# Patient Record
Sex: Male | Born: 1955
Health system: Southern US, Community
[De-identification: ages and names within clinical notes are randomized; demographics above are authoritative.]

## PROBLEM LIST (undated history)

## (undated) DIAGNOSIS — K420 Umbilical hernia with obstruction, without gangrene: Secondary | ICD-10-CM

## (undated) DIAGNOSIS — F1411 Cocaine abuse, in remission: Secondary | ICD-10-CM

## (undated) DIAGNOSIS — R011 Cardiac murmur, unspecified: Secondary | ICD-10-CM

## (undated) DIAGNOSIS — R0602 Shortness of breath: Secondary | ICD-10-CM

## (undated) DIAGNOSIS — I00 Rheumatic fever without heart involvement: Secondary | ICD-10-CM

## (undated) DIAGNOSIS — I10 Essential (primary) hypertension: Secondary | ICD-10-CM

## (undated) DIAGNOSIS — I509 Heart failure, unspecified: Secondary | ICD-10-CM

## (undated) DIAGNOSIS — K5790 Diverticulosis of intestine, part unspecified, without perforation or abscess without bleeding: Secondary | ICD-10-CM

## (undated) HISTORY — PX: UMBILICAL HERNIA REPAIR: SHX196

## (undated) HISTORY — DX: Diverticulosis of intestine, part unspecified, without perforation or abscess without bleeding: K57.90

## (undated) HISTORY — PX: TONSILLECTOMY: SUR1361

## (undated) HISTORY — DX: Cocaine abuse, in remission: F14.11

## (undated) HISTORY — DX: Rheumatic fever without heart involvement: I00

---

## 2002-01-15 ENCOUNTER — Emergency Department (HOSPITAL_COMMUNITY): Admission: EM | Admit: 2002-01-15 | Discharge: 2002-01-15 | Payer: Self-pay | Admitting: Emergency Medicine

## 2003-12-13 ENCOUNTER — Emergency Department (HOSPITAL_COMMUNITY): Admission: EM | Admit: 2003-12-13 | Discharge: 2003-12-13 | Payer: Self-pay | Admitting: Emergency Medicine

## 2010-11-08 ENCOUNTER — Emergency Department (HOSPITAL_COMMUNITY): Payer: Self-pay

## 2010-11-08 ENCOUNTER — Inpatient Hospital Stay (HOSPITAL_COMMUNITY)
Admission: EM | Admit: 2010-11-08 | Discharge: 2010-11-14 | DRG: 287 | Disposition: A | Discharge status: Home / Self Care | Payer: Self-pay | Attending: Internal Medicine | Admitting: Internal Medicine

## 2010-11-08 DIAGNOSIS — F1010 Alcohol abuse, uncomplicated: Secondary | ICD-10-CM | POA: Diagnosis present

## 2010-11-08 DIAGNOSIS — I509 Heart failure, unspecified: Secondary | ICD-10-CM | POA: Diagnosis present

## 2010-11-08 DIAGNOSIS — I5023 Acute on chronic systolic (congestive) heart failure: Principal | ICD-10-CM | POA: Diagnosis present

## 2010-11-08 DIAGNOSIS — F17200 Nicotine dependence, unspecified, uncomplicated: Secondary | ICD-10-CM | POA: Diagnosis present

## 2010-11-08 DIAGNOSIS — R0602 Shortness of breath: Secondary | ICD-10-CM

## 2010-11-08 DIAGNOSIS — Z9119 Patient's noncompliance with other medical treatment and regimen: Secondary | ICD-10-CM

## 2010-11-08 DIAGNOSIS — I10 Essential (primary) hypertension: Secondary | ICD-10-CM | POA: Diagnosis present

## 2010-11-08 DIAGNOSIS — Z91199 Patient's noncompliance with other medical treatment and regimen due to unspecified reason: Secondary | ICD-10-CM

## 2010-11-08 DIAGNOSIS — Z7982 Long term (current) use of aspirin: Secondary | ICD-10-CM

## 2010-11-08 DIAGNOSIS — I428 Other cardiomyopathies: Secondary | ICD-10-CM | POA: Diagnosis present

## 2010-11-08 HISTORY — DX: Heart failure, unspecified: I50.9

## 2010-11-08 HISTORY — DX: Essential (primary) hypertension: I10

## 2010-11-08 LAB — CARDIAC PANEL(CRET KIN+CKTOT+MB+TROPI)
CK, MB: 7.8 ng/mL (ref 0.3–4.0)
CK, MB: 5.5 ng/mL — ABNORMAL HIGH (ref 0.3–4.0)
Total CK: 150 U/L (ref 7–232)

## 2010-11-08 LAB — DIFFERENTIAL
Basophils Absolute: 0.1 10*3/uL (ref 0.0–0.1)
Basophils Relative: 1 % (ref 0–1)
Eosinophils Relative: 3 % (ref 0–5)
Monocytes Absolute: 0.5 10*3/uL (ref 0.1–1.0)

## 2010-11-08 LAB — D-DIMER, QUANTITATIVE: D-Dimer, Quant: 0.52 ug/mL-FEU — ABNORMAL HIGH (ref 0.00–0.48)

## 2010-11-08 LAB — TSH: TSH: 1.587 u[IU]/mL (ref 0.350–4.500)

## 2010-11-08 LAB — CBC
MCHC: 34.2 g/dL (ref 30.0–36.0)
Platelets: 235 10*3/uL (ref 150–400)
RDW: 14.6 % (ref 11.5–15.5)

## 2010-11-08 LAB — BASIC METABOLIC PANEL
GFR calc Af Amer: >60 mL/min (ref >60)
GFR calc non Af Amer: >60 mL/min (ref >60)
Potassium: 3.8 mEq/L (ref 3.5–5.1)
Sodium: 140 mEq/L (ref 135–145)

## 2010-11-09 ENCOUNTER — Inpatient Hospital Stay (HOSPITAL_COMMUNITY): Payer: Self-pay

## 2010-11-09 ENCOUNTER — Encounter (HOSPITAL_COMMUNITY): Payer: Self-pay | Admitting: Radiology

## 2010-11-09 DIAGNOSIS — M7989 Other specified soft tissue disorders: Secondary | ICD-10-CM

## 2010-11-09 DIAGNOSIS — I501 Left ventricular failure: Secondary | ICD-10-CM

## 2010-11-09 DIAGNOSIS — I509 Heart failure, unspecified: Secondary | ICD-10-CM

## 2010-11-09 LAB — CARDIAC PANEL(CRET KIN+CKTOT+MB+TROPI)
CK, MB: 4.4 ng/mL — ABNORMAL HIGH (ref 0.3–4.0)
Total CK: 105 U/L (ref 7–232)
Troponin I: <0.3 ng/mL (ref <0.30)

## 2010-11-09 LAB — DIFFERENTIAL
Basophils Absolute: 0.1 10*3/uL (ref 0.0–0.1)
Eosinophils Relative: 3 % (ref 0–5)
Lymphocytes Relative: 28 % (ref 12–46)

## 2010-11-09 LAB — BASIC METABOLIC PANEL
BUN: 15 mg/dL (ref 6–23)
Calcium: 9.2 mg/dL (ref 8.4–10.5)
Creatinine, Ser: 1.14 mg/dL (ref 0.50–1.35)
GFR calc Af Amer: >60 mL/min (ref >60)
GFR calc non Af Amer: >60 mL/min (ref >60)
Potassium: 3.8 mEq/L (ref 3.5–5.1)

## 2010-11-09 LAB — CBC
HCT: 41.1 % (ref 39.0–52.0)
Platelets: 212 10*3/uL (ref 150–400)
RDW: 14.5 % (ref 11.5–15.5)
WBC: 6 10*3/uL (ref 4.0–10.5)

## 2010-11-09 LAB — POCT I-STAT, CHEM 8
BUN: 14 mg/dL (ref 6–23)
Creatinine, Ser: 1.2 mg/dL (ref 0.50–1.35)
Glucose, Bld: 95 mg/dL (ref 70–99)
HCT: 45 % (ref 39.0–52.0)
Hemoglobin: 15.3 g/dL (ref 13.0–17.0)

## 2010-11-09 MED ORDER — IOHEXOL 300 MG/ML  SOLN
100.0000 mL | Freq: Once | INTRAMUSCULAR | Status: AC | PRN
  Administered 2010-11-09: 100 mL via INTRAVENOUS

## 2010-11-10 DIAGNOSIS — I5021 Acute systolic (congestive) heart failure: Secondary | ICD-10-CM

## 2010-11-10 LAB — PRO B NATRIURETIC PEPTIDE: Pro B Natriuretic peptide (BNP): 165.4 pg/mL — ABNORMAL HIGH (ref 0–125)

## 2010-11-10 LAB — BASIC METABOLIC PANEL
Chloride: 102 mEq/L (ref 96–112)
Creatinine, Ser: 1.07 mg/dL (ref 0.50–1.35)
GFR calc Af Amer: >60 mL/min (ref >60)
Potassium: 4.3 mEq/L (ref 3.5–5.1)
Sodium: 138 mEq/L (ref 135–145)

## 2010-11-11 LAB — BASIC METABOLIC PANEL
BUN: 13 mg/dL (ref 6–23)
BUN: 13 mg/dL (ref 6–23)
CO2: 26 mEq/L (ref 19–32)
Chloride: 103 mEq/L (ref 96–112)
Chloride: 103 mEq/L (ref 96–112)
Creatinine, Ser: 1.15 mg/dL (ref 0.50–1.35)
Creatinine, Ser: 1.13 mg/dL (ref 0.50–1.35)
GFR calc Af Amer: >60 mL/min (ref >60)
Glucose, Bld: 128 mg/dL — ABNORMAL HIGH (ref 70–99)
Potassium: 4.2 mEq/L (ref 3.5–5.1)
Potassium: 4.7 mEq/L (ref 3.5–5.1)

## 2010-11-11 LAB — CBC
HCT: 45 % (ref 39.0–52.0)
Hemoglobin: 15.2 g/dL (ref 13.0–17.0)
Hemoglobin: 15.9 g/dL (ref 13.0–17.0)
MCHC: 34.5 g/dL (ref 30.0–36.0)
MCHC: 35.3 g/dL (ref 30.0–36.0)
MCV: 95.3 fL (ref 78.0–100.0)
RBC: 4.6 MIL/uL (ref 4.22–5.81)
RDW: 14 % (ref 11.5–15.5)
WBC: 7.9 10*3/uL (ref 4.0–10.5)
WBC: 7 10*3/uL (ref 4.0–10.5)

## 2010-11-11 LAB — POCT I-STAT 3, ART BLOOD GAS (G3+)
Acid-base deficit: 1 mmol/L (ref 0.0–2.0)
Acid-base deficit: 2 mmol/L (ref 0.0–2.0)
Bicarbonate: 22 mEq/L (ref 20.0–24.0)
Bicarbonate: 24 mEq/L (ref 20.0–24.0)
O2 Saturation: 89 %
O2 Saturation: 93 %
TCO2: 25 mmol/L (ref 0–100)
pCO2 arterial: 33.7 mmHg — ABNORMAL LOW (ref 35.0–45.0)
pO2, Arterial: 57 mmHg — ABNORMAL LOW (ref 80.0–100.0)

## 2010-11-11 LAB — POCT I-STAT 3, VENOUS BLOOD GAS (G3P V)
Bicarbonate: 23.3 mEq/L (ref 20.0–24.0)
Bicarbonate: 25.2 mEq/L — ABNORMAL HIGH (ref 20.0–24.0)
O2 Saturation: 55 %
O2 Saturation: 61 %
TCO2: 25 mmol/L (ref 0–100)
TCO2: 26 mmol/L (ref 0–100)
pCO2, Ven: 39 mmHg — ABNORMAL LOW (ref 45.0–50.0)
pCO2, Ven: 40.2 mmHg — ABNORMAL LOW (ref 45.0–50.0)
pCO2, Ven: 41.5 mmHg — ABNORMAL LOW (ref 45.0–50.0)
pH, Ven: 7.364 — ABNORMAL HIGH (ref 7.250–7.300)
pH, Ven: 7.371 — ABNORMAL HIGH (ref 7.250–7.300)
pH, Ven: 7.391 — ABNORMAL HIGH (ref 7.250–7.300)
pO2, Ven: 30 mmHg (ref 30.0–45.0)
pO2, Ven: 32 mmHg (ref 30.0–45.0)

## 2010-11-11 LAB — PROTIME-INR
INR: 1.2 (ref 0.00–1.49)
Prothrombin Time: 15.5 seconds — ABNORMAL HIGH (ref 11.6–15.2)

## 2010-11-11 LAB — APTT: aPTT: 28 seconds (ref 24–37)

## 2010-11-11 LAB — HEPATIC FUNCTION PANEL
ALT: 63 U/L — ABNORMAL HIGH (ref 0–53)
AST: 16 U/L (ref 0–37)
Total Protein: 6.4 g/dL (ref 6.0–8.3)

## 2010-11-11 NOTE — Consult Note (Signed)
NAMEDANH, BAYUS NO.:  [id number]  MEDICAL RECORD NO.:  [id number]  LOCATION:  4738                         FACILITY:  [facility]  PHYSICIAN:  Cassell Clement, M.D. DATE OF BIRTH:  10-17-1955  DATE OF CONSULTATION: DATE OF DISCHARGE:                                CONSULTATION   CARDIOLOGIST:  None.  PRIMARY MD:  None.  CHIEF COMPLAINT:  Shortness of breath.  HISTORY:  This 55 year old gentleman is admitted with a 2 months history of exertional dyspnea.  He states that the dyspnea came on rapidly 2 months ago while he was walking to the store.  He was quite short of breath and had to stop and rest.  He did not seek medical attention at that time.  He initially improved without specific therapy, but then over the last 2 months has gradually worsened again.  He has developed severe orthopnea.  Several days ago, he also noted some swelling of his left leg and foot.  The patient has noted occasional pounding of his heart.  He insists that he has had no chest pain.  He has had no hemoptysis or pleuritic pain.  His past medical history is positive for a history of rheumatic fever at age 50.  He states that he was hospitalized at [facility] at that time for 3 months.  He states he was treated with sulfa drugs for 3 months because of a questionable allergy to PENICILLIN at that time.  He was told as a child that he had a "hole in his heart."  The patient also gives a history of having had high blood pressure in the past, but he was last treated about 9 or 10 years ago for that.  The social history reveals that he is divorced.  He has two children and one adopted child.  He drinks about 12 beers on the weekends.  He does not drink during the week when he is working.  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  He smokes about a fourth of a pack of cigarettes a day.  He has not used any crack cocaine since about 2005.  His  family history reveals that his mother is still alive, but has end- stage renal disease, on dialysis.  The patient's father died when he was a young child of unknown cause.  REVIEW OF SYSTEMS:  The patient has occasional chills and sweats.  He has not had any significant weight change.  He notes occasional nocturia.  He has had no history of peptic ulcer, hematochezia or melena.  He has had a dry cough, but no sputum production.  All other systems negative in detail.  HOME MEDICATIONS:  None.  ALLERGIES:  History of questionable allergy to PENICILLIN.  PHYSICAL EXAMINATION:  VITAL SIGNS:  His blood pressure is 110/80, pulse is 107 and regular. GENERAL APPEARANCE:  Reveals a well-developed and well-nourished gentleman in no distress. NECK:  The jugular venous pressure is elevated.  The carotids reveal no bruits.  There is no lymphadenopathy. HEENT:  Pupils are equal and reactive.  Extraocular movements are full. CHEST:  Clear to percussion and auscultation.  HEART:  Reveals an S3 gallop.  No S4.  I do not hear any opening snap or mitral rumble tonight. ABDOMEN:  Reveals an umbilical hernia about the sizable across bowl.  It is soft and nontender. EXTREMITIES:  Show 1+ left lower leg pitting edema.  There is no calf tenderness or evidence of phlebitis.  His chest x-ray shows moderate cardiomegaly.  The patient has diffuse interstitial thickening of indeterminate acuity, which could be component of mild pulmonary venous hypertension.  His electrocardiogram shows sinus tachycardia.  He has a left atrial enlargement by EKG.  There are no ischemic ST-T wave changes.  Laboratory studies include normal CBC and normal electrolytes, normal renal function.  His CK-MB is elevated at 7.8 and his troponin is less than 0.30.  B-natriuretic peptide is 986.  IMPRESSION: 1. Congestive heart failure, which appears to be acute on chronic by     history.  The type of congestive heart failure  whether systolic or     diastolic remains to be determined, but I suspect he probably has     at least an element of systolic dysfunction.  The echo will be very     helpful in this regard. 2. History of rheumatic heart disease at age 60.  EKG suggest left     atrial enlargement.  Have to keep in mind mitral stenosis, although     I do not hear any opening snap or mitral stenosis murmur tonight. 3. History of moderate alcohol on weekends.  Consider possible     etiologic factor if the echo shows a dilated left ventricle.  PLAN:  Agree with workup as outlined by [facility].  We will check a D-dimer in view of his left leg edema.  Await echo results. Continue Lasix.  We will follow.  Thank you for this interesting consult.          ______________________________ Cassell Clement, M.D.     TB/MEDQ  D:  11/08/2010  T:  11/09/2010  Job:  [id number]  Electronically Signed by Cassell Clement M.D. on 11/11/2010 12:56:53 PM

## 2010-11-12 DIAGNOSIS — I5023 Acute on chronic systolic (congestive) heart failure: Secondary | ICD-10-CM

## 2010-11-12 LAB — CBC
HCT: 45.1 % (ref 39.0–52.0)
Hemoglobin: 15.8 g/dL (ref 13.0–17.0)
MCH: 33.3 pg (ref 26.0–34.0)
MCHC: 35 g/dL (ref 30.0–36.0)
RBC: 4.74 MIL/uL (ref 4.22–5.81)

## 2010-11-12 LAB — BASIC METABOLIC PANEL
BUN: 17 mg/dL (ref 6–23)
CO2: 27 mEq/L (ref 19–32)
Calcium: 9.2 mg/dL (ref 8.4–10.5)
Glucose, Bld: 92 mg/dL (ref 70–99)
Potassium: 4 mEq/L (ref 3.5–5.1)
Sodium: 136 mEq/L (ref 135–145)

## 2010-11-13 LAB — BASIC METABOLIC PANEL WITH GFR
BUN: 17 mg/dL (ref 6–23)
CO2: 27 meq/L (ref 19–32)
Calcium: 9.2 mg/dL (ref 8.4–10.5)
Chloride: 101 meq/L (ref 96–112)
Creatinine, Ser: 1.23 mg/dL (ref 0.50–1.35)
GFR calc Af Amer: >60 mL/min
GFR calc non Af Amer: >60 mL/min
Glucose, Bld: 99 mg/dL (ref 70–99)
Potassium: 4.5 meq/L (ref 3.5–5.1)
Sodium: 137 meq/L (ref 135–145)

## 2010-11-13 LAB — GLUCOSE, CAPILLARY: Glucose-Capillary: 111 mg/dL — ABNORMAL HIGH (ref 70–99)

## 2010-11-14 LAB — BASIC METABOLIC PANEL
CO2: 28 mEq/L (ref 19–32)
Chloride: 99 mEq/L (ref 96–112)
Glucose, Bld: 93 mg/dL (ref 70–99)
Potassium: 4.2 mEq/L (ref 3.5–5.1)
Sodium: 134 mEq/L — ABNORMAL LOW (ref 135–145)

## 2010-11-14 LAB — HEPATITIS PANEL, ACUTE
HCV Ab: NEGATIVE
Hep A IgM: NEGATIVE
Hep B C IgM: NEGATIVE
Hepatitis B Surface Ag: NEGATIVE

## 2010-11-17 ENCOUNTER — Encounter: Payer: Self-pay | Admitting: *Deleted

## 2010-11-17 ENCOUNTER — Encounter (HOSPITAL_COMMUNITY): Payer: Self-pay | Admitting: Internal Medicine

## 2010-11-21 ENCOUNTER — Ambulatory Visit (HOSPITAL_COMMUNITY)
Admission: RE | Admit: 2010-11-21 | Discharge: 2010-11-21 | Disposition: A | Discharge status: Home / Self Care | Payer: Self-pay | Source: Ambulatory Visit | Attending: Internal Medicine | Admitting: Internal Medicine

## 2010-11-21 VITALS — BP 92/60 | HR 84 | Wt 219.5 lb

## 2010-11-21 DIAGNOSIS — F1010 Alcohol abuse, uncomplicated: Secondary | ICD-10-CM | POA: Insufficient documentation

## 2010-11-21 DIAGNOSIS — Z720 Tobacco use: Secondary | ICD-10-CM

## 2010-11-21 DIAGNOSIS — I5022 Chronic systolic (congestive) heart failure: Secondary | ICD-10-CM | POA: Insufficient documentation

## 2010-11-21 DIAGNOSIS — F17200 Nicotine dependence, unspecified, uncomplicated: Secondary | ICD-10-CM | POA: Insufficient documentation

## 2010-11-21 LAB — BASIC METABOLIC PANEL
BUN: 35 mg/dL — ABNORMAL HIGH (ref 6–23)
Calcium: 11.1 mg/dL — ABNORMAL HIGH (ref 8.4–10.5)
Creatinine, Ser: 2.39 mg/dL — ABNORMAL HIGH (ref 0.50–1.35)
GFR calc non Af Amer: 31 mL/min — ABNORMAL LOW (ref >90)
Glucose, Bld: 97 mg/dL (ref 70–99)

## 2010-11-21 NOTE — Progress Notes (Signed)
HPI:  Shawn Perkins is a 55 year old African  American male with a h/o severe HTN, tobacco and ETOH use who was admitted to [facility] in September 2012 due to ADHF.     Workup showed EF 20%. Underwent cath (11/11/10) which showed normal cors. EF 20% RA 7, RV 51/4, PA 61/30, with a mean of 47, pulmonary capillary   wedge pressure mean 30.  Cardiac output was 4.0.  He was diuresed aggressively and lisinopril, spiro, carvedilol and lasix. His discharge weight was 99.8 kg.    He returns for f/u. He says he is doing quite well. 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 No CP, SOB or orthopnea. He has been very compliant with his medications. Weight is stable at 215 pounds. Feels lightheaded at times when standing. No syncope.  Has stopped drinking completely. Cutting back on cigarettes.   ROS: All systems negative except as listed in HPI, PMH and Problem List.  Past Medical History  Diagnosis Date  . Systolic heart failure     11/09/10 echo with EF 35%, 11/11/10 cath with EF 20%  . Hypertension   . Rheumatic fever     as a child  . History of cocaine abuse     use to smoke crack cocaine, last use was more than 6 years ago    Current Outpatient Prescriptions  Medication Sig Dispense Refill  . carvedilol (COREG) 3.125 MG tablet Take 3.125 mg by mouth 2 (two) times daily with a meal.        . furosemide (LASIX) 40 MG tablet Take 40 mg by mouth daily.        Marland Kitchen lisinopril (PRINIVIL,ZESTRIL) 10 MG tablet Take 10 mg by mouth 2 (two) times daily.        Marland Kitchen spironolactone (ALDACTONE) 25 MG tablet Take 25 mg by mouth daily.           PHYSICAL EXAM: Filed Vitals:   11/21/10 1035  BP: 92/60  Pulse: 84   General:  Well appearing. No resp difficulty HEENT: normal Neck: supple. JVP flat. Carotids 2+ bilaterally; no bruits. No lymphadenopathy or thryomegaly appreciated. Cor: PMI normal. Regular rate & rhythm. No rubs, gallops or  murmurs. Lungs: clear Abdomen: soft, nontender, nondistended. No hepatosplenomegaly. No bruits or masses. Good bowel sounds. Extremities: no cyanosis, clubbing, rash, edema Neuro: alert & orientedx3, cranial nerves grossly intact. Moves all 4 extremities w/o difficulty. Affect pleasant.   ASSESSMENT & PLAN:

## 2010-11-21 NOTE — Assessment & Plan Note (Signed)
Doing well. NYHA II. However he is volume depleted and hypotensive. Will hold lasix for 3 days and resume at 20mg  daily on Thursday. Cut lisinopril to 10qd. Liberalize salt today. Check labs. F/u 1 week.

## 2010-11-21 NOTE — Assessment & Plan Note (Signed)
He has cut back dramatically. I have counseled him on the need to stop completely.

## 2010-11-21 NOTE — Assessment & Plan Note (Signed)
Has stopped drinking completely.

## 2010-11-21 NOTE — Patient Instructions (Signed)
Hold Furosemide (Lasix) for 3 days, then resume at 20 mg daily starting on Thursday Decrease Lisinopril to 10 mg daily  Labs today  Your physician recommends that you schedule a follow-up appointment in: 1 week

## 2010-11-28 ENCOUNTER — Encounter (HOSPITAL_COMMUNITY): Payer: Self-pay

## 2010-11-28 ENCOUNTER — Ambulatory Visit (HOSPITAL_COMMUNITY)
Admission: RE | Admit: 2010-11-28 | Discharge: 2010-11-28 | Disposition: A | Discharge status: Home / Self Care | Payer: Self-pay | Source: Ambulatory Visit | Attending: Internal Medicine | Admitting: Internal Medicine

## 2010-11-28 VITALS — BP 117/88 | HR 74 | Wt 223.8 lb

## 2010-11-28 DIAGNOSIS — I5022 Chronic systolic (congestive) heart failure: Secondary | ICD-10-CM

## 2010-11-28 DIAGNOSIS — I10 Essential (primary) hypertension: Secondary | ICD-10-CM | POA: Insufficient documentation

## 2010-11-28 DIAGNOSIS — I5020 Unspecified systolic (congestive) heart failure: Secondary | ICD-10-CM | POA: Insufficient documentation

## 2010-11-28 DIAGNOSIS — Z79899 Other long term (current) drug therapy: Secondary | ICD-10-CM | POA: Insufficient documentation

## 2010-11-28 LAB — BASIC METABOLIC PANEL
BUN: 21 mg/dL (ref 6–23)
Calcium: 10 mg/dL (ref 8.4–10.5)
GFR calc non Af Amer: 69 mL/min — ABNORMAL LOW (ref >90)
Glucose, Bld: 84 mg/dL (ref 70–99)

## 2010-11-28 MED ORDER — CARVEDILOL 3.125 MG PO TABS: 6.2500 mg | ORAL_TABLET | Freq: Two times a day (BID) | ORAL | Status: DC

## 2010-11-28 NOTE — Patient Instructions (Addendum)
Increase Coreg to 6.25 mg bid   Check BMET   Please weigh and record daily   Follow up in 3-4 weeks

## 2010-11-28 NOTE — Progress Notes (Signed)
  HPI:  Shawn Perkins is a 55 year old African  American male with a h/o severe HTN, tobacco and ETOH use who was admitted to [facility] in September 2012 due to ADHF.     Workup showed EF 20%. Underwent cath (11/11/10) which showed normal cors. EF 20% RA 7, RV 51/4, PA 61/30, with a mean of 47, pulmonary capillary   wedge pressure mean 30.  Cardiac output was 4.0.  He was diuresed aggressively and lisinopril, spiro, carvedilol and lasix. His discharge weight was 99.8 kg.    He returns for f/u. He feels better. Just dizzy  when he bends over. Last visit Spironolactone stopped due to elevated potassium and also decreased lisinopril 10 mg daily.  No CP, SOB or orthopnea. He has been very compliant with his medications. Weight is stable at 215 pounds.  Able to sleep on one pillow flat in bed.  Continues to walk 3-4 miles a day. Smokes 1-2  cigarettes.   ROS: All systems negative except as listed in HPI, PMH and Problem List.  Past Medical History  Diagnosis Date  . Systolic heart failure     11/09/10 echo with EF 35%, 11/11/10 cath with EF 20%  . Hypertension   . Rheumatic fever     as a child  . History of cocaine abuse     use to smoke crack cocaine, last use was more than 6 years ago    Current Outpatient Prescriptions  Medication Sig Dispense Refill  . carvedilol (COREG) 3.125 MG tablet Take 3.125 mg by mouth 2 (two) times daily with a meal.        . furosemide (LASIX) 40 MG tablet Take 40 mg by mouth daily.        Marland Kitchen lisinopril (PRINIVIL,ZESTRIL) 10 MG tablet Take 10 mg by mouth daily.          PHYSICAL EXAM: Filed Vitals:   11/28/10 1200  BP: 117/88  Pulse: 74   General:  Well appearing. No resp difficulty HEENT: normal Neck: supple. JVP flat. Carotids 2+ bilaterally; no bruits. No lymphadenopathy or thryomegaly appreciated. Cor: PMI normal. Regular rate & rhythm. No rubs, gallops or murmurs. Lungs: clear Abdomen: soft, nontender, nondistended. No hepatosplenomegaly. No  bruits or masses. Good bowel sounds. Extremities: no cyanosis, clubbing, rash, no edema Neuro: alert & orientedx3, cranial nerves grossly intact. Moves all 4 extremities w/o difficulty. Affect pleasant.   ASSESSMENT & PLAN:

## 2010-11-28 NOTE — Assessment & Plan Note (Addendum)
NYHA II. Volume status stable. He is tolerating medications. Will increase Carvedilol to 6. 25 mg BID. Check BMET today. Discussed exercise limitations and benefits of aerobic exercise. Avoid heavy straining. Follow up in 2 weeks for medication titration.   Patient seen and examined with Tonye Becket, NP. We discussed all aspects of the encounter. I agree with the assessment and plan as stated above.

## 2010-11-30 ENCOUNTER — Telehealth (HOSPITAL_COMMUNITY): Payer: Self-pay | Admitting: *Deleted

## 2010-11-30 MED ORDER — CARVEDILOL 6.25 MG PO TABS: 6.2500 mg | ORAL_TABLET | Freq: Two times a day (BID) | ORAL | Status: DC

## 2010-11-30 NOTE — Telephone Encounter (Signed)
Pt states pharmacy does not have prescription for carvedilol, new rx for carvedilol 6.25 called into pharmacy

## 2010-11-30 NOTE — Telephone Encounter (Signed)
Pt called this am, a prescription needs to be called into [facility] from his appt on Monday.

## 2010-12-01 NOTE — Cardiovascular Report (Signed)
  NAMERHYLAND, HINDERLITER NO.:  [id number]  MEDICAL RECORD NO.:  [id number]  LOCATION:  4738                         FACILITY:  [facility]  PHYSICIAN:  Rollene Rotunda, MD, FACCDATE OF BIRTH:  1955-05-27  DATE OF PROCEDURE:  11/11/2010 DATE OF DISCHARGE:                           CARDIAC CATHETERIZATION   PRIMARY CARE PHYSICIAN:  None.  CARDIOLOGIST:  Cassell Clement, MD  PROCEDURE:  Left and right heart catheterization/coronary arteriography.  INDICATION:  The patient with dilated cardiomyopathy.  PROCEDURE NOTE:  Left heart catheterization was performed via right femoral artery, right heart catheterization performed via right femoral vein.  Both vessels cannulated using an anterior wall puncture.  A # 5- French arterial sheath and a #7-French venous sheath were inserted via the modified Seldinger technique.  Preformed Judkins, pigtail, a Swan- Ganz catheters were utilized.  The patient tolerated the procedure well and left lab in stable condition.  RESULTS:  Hemodynamics RA 7, RV 51/4, PA 61/30 with a mean of 47, pulmonary capillary wedge pressure mean 30, LV 107/25, AO 97/46, cardiac output/cardiac index (Fick) 4.5, 9/2.08, SVR 14/48 coronaries, left main was normal.  The LAD had luminal irregularities.  First and second diagonal were small and normal.  Circumflex in the AV groove was normal. Posterolateral x2 was small and normal.  Ramus intermediate was large and normal.  Right coronary artery was dominant and normal.  PDA was moderate size and normal.  Left ventricular ejection fraction, EF was obtained in the RAO position.  The EF was 20% with global hypokinesis.  CONCLUSION:  Severe dilated cardiomyopathy.  Elevated EDP and wedge.  PLAN:  I reviewed this with Dr. Gala Romney.  We plan to continue medical management.  We will try to push his afterload reduction and I will increase his ACE inhibitor today.  I will resume Lasix tonight understanding that he  did bump his creatinine.  These have been ordered. We need to follow his chemistry closely.  He needs to stop drinking alcohol which may be the etiology of his cardiomyopathy.     Rollene Rotunda, MD, [facility]     JH/MEDQ  D:  11/11/2010  T:  11/11/2010  Job:  [id number]  Electronically Signed by Rollene Rotunda MD [facility] on 12/01/2010 01:38:23 PM

## 2010-12-01 NOTE — H&P (Signed)
NAME:  JAKWAN, SALLY NO.:  [id number]  MEDICAL RECORD NO.:  [id number]  LOCATION:  [facility]                         FACILITY:  [facility]  PHYSICIAN:  Jeoffrey Massed, MD    DATE OF BIRTH:  1955-09-25  DATE OF ADMISSION:  11/08/2010 DATE OF DISCHARGE:                             HISTORY & PHYSICAL   PRIMARY CARE PRACTITIONER:  He does not have one.  HISTORY OF PRESENT ILLNESS:  Mr. Amirault is a very pleasant 55 year old African American male who unfortunately has not seen a primary care practitioner for the past number of years who apparently does carry a diagnosis of hypertension, but has not been on any antihypertensive medications for the past 6 years who does have a past medical history of having rheumatic fever in the 1960s, comes in with a 47-month history of shortness of breath and leg swelling.  He claims that although this has been going on for 2 months, over the past few days this has been worsening.  His shortness of breath is mostly exertional and mostly when he is going uphill or climbing flight of stairs.  He says he can climb up to 1 flight of stairs, but when trying to climb 2 flights of stairs he has to stop, take a break and then go on.  He also claims that he has not been able to lie flat and as a result he has been sleeping upright mostly in a chair.  He also has noted that he has had worsening lower extremity edema.  He also claims that he has been having a cough that is mostly dry for the past a number of weeks.  He was little reluctant to come to the emergency room for further evaluation, but was pushed by his brother and his boss and therefore decided to come here for evaluation. He denies any other complaints like chest pain, nausea, vomiting, diarrhea, headache, or fever.  ALLERGIES:  The patient is allergic to PENICILLIN.  PAST MEDICAL HISTORY: 1. Hypertension, noncompliant to medications. 2. History of rheumatic fever when he was a  child. 3. History of crack cocaine use, last use more than 6 years ago.  He     used to smoke it.  Never did IVDA.  PAST SURGICAL HISTORY:  None.  MEDICATIONS AT HOME:  None.  FAMILY HISTORY:  Denies any family history of heart disease.  SOCIAL HISTORY:  The patient is a smoker.  A few months ago, he used to smoke much more around a pack a day, but lately because of shortness of breath, he has been limited to only 4-5 cigarettes a day.  He drinks beer mostly in the weekends.  He used to smoke crack cocaine, but that was 6 years ago and has stopped since.  REVIEW OF SYSTEMS:  A detailed review of 12 systems was done and these are negative except for the ones noted in the HPI.  PHYSICAL EXAMINATION:  GENERAL:  Lying in bed, slightly dyspneic, but not in acute distress, seems comfortable, speaking in full sentences, not using any accessory muscles of respiration, awake and alert with clear speech. VITAL SIGNS:  Temperature of 98.8, heart rate  of 105, blood pressure of 151/105, and pulse ox of 94% on 2 liters. HEENT:  Atraumatic and normocephalic.  Pupils are equally reactive to light and accommodation. NECK:  Supple.  There does appear to be some mild prominent veins in his neck. CHEST:  There is good air entry with bibasilar rales. CARDIOVASCULAR:  Heart sounds are tachycardic, but regular.  There does appear to be a diastolic murmur best heard in the apex which does seem to radiate into the axilla as well. ABDOMEN:  Soft, nontender, and nondistended.  He does have an upper umbilical hernia in place. EXTREMITIES:  There is 2+ pitting edema in his bilateral lower extremities. NEUROLOGIC:  The patient is awake and alert with no focal neurological deficits.  LABORATORY DATA: 1. CBC shows a WBC of 7.7, hemoglobin of 13.9, hematocrit of 40.7, and     a platelet count of 235. 2. Pro-BNP is 986. 3. Chemistry shows sodium of 140, potassium of 3.8, chloride of 108,     bicarb of 21,  glucose of 86, BUN of 14, creatinine of 1.05, and a     calcium of 9.0.  RADIOLOGICAL STUDIES: 1. X-ray of the chest showed cardiomegaly with diffuse interstitial     thickening.  This is of indeterminate acuity.  Although, this could     represent chronic bronchitis, the sequela of smoking, component of     mild pulmonary venous congestion is suggested.  ASSESSMENT: 1. Likely acute congestive heart failure decompensation, not known at     this time whether this is systolic versus diastolic. 2. Rule out mitral stenosis.  PLAN: 1. The patient will be admitted to telemetry unit. 2. For now, we will diurese him with Lasix 40 mg IV b.i.d. and place     him on lisinopril 10 mg p.o. daily. 3. We will order 2-D echocardiogram. 4. We will also consult Cardiology for further assistance. 5. Cardiac enzymes will be cycled. 6. DVT prophylaxis with Lovenox. 7. Further plan will depend as the patient's clinical course,     evaluation, and further studies obtained. 8. Code status full code.  Total time spent for admission 45 minutes.     Jeoffrey Massed, MD     SG/MEDQ  D:  11/08/2010  T:  11/08/2010  Job:  [id number]  Electronically Signed by Jeoffrey Massed  on 12/01/2010 04:19:05 PM

## 2010-12-04 NOTE — Consult Note (Signed)
Shawn Perkins, Shawn Perkins NO.:  [id number]  MEDICAL RECORD NO.:  [id number]  LOCATION:  4738                         FACILITY:  [facility]  PHYSICIAN:  Shawn Buckles. Yuliana Perkins, MDDATE OF BIRTH:  Apr 17, 1955  DATE OF CONSULTATION:  11/10/2010 DATE OF DISCHARGE:                                CONSULTATION   PRIMARY CARE PHYSICIAN:  None.  REQUESTING PHYSICIAN:  Shawn Clement, MD.  REASON FOR CONSULTATION:  Help with management of heart failure.  Shawn Perkins is a very pleasant 55 year old male with a history of rheumatic fever.  He denies any other significant medical problems.  He has not followed up with doctor in many, many years.  He tells me that when he was young, he spent 3 months at [facility] for rheumatic fever.  He was treated with sulfa drug, as there was some question of an allergy to penicillin.  He recovered from that and has not had any heart problems since that time.  He does have a history of marijuana and cocaine abuse, but he quit in 2004 or 2005.  He does continue to drink alcohol about 12 beers on the weekend.  He denies any other drinking.  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  He says he walks almost 3 miles back-and- forth to his work.  Over the past 2 months, he has noticed increasing exertional dyspnea, and the past day or 2 prior to admission, had orthopnea and PND.  He also noticed some swelling of his left lower extremity.  He did not have any chest pain.  He finally came to the hospital for evaluation.  He was admitted to [facility].  He was tachycardic with an apical murmur.  Echocardiogram was done revealed a markedly dilated LV with biventricular dysfunction, ejection fraction is approximately 25%. He was started on Lasix and diuresed about 5 pounds and now feels much better.  He is also started on ACE inhibitor.  This morning, his blood pressure dropped into the low 90s, so his  diuretics were cut back.  He notices that he has been somewhat tachycardic, but denies any syncope or presyncope.  He has not had any hemoptysis or bleeding or other focal neurologic signs.  REVIEW OF SYSTEMS:  All systems are negative except for HPI and problem list.  PAST MEDICAL HISTORY: 1. Rheumatic fever as a child. 2. History of polysubstance abuse. 3. History of tobacco use, ongoing. 4. Alcohol abuse on weekends.  ADMISSION MEDICATIONS:  None.  CURRENT IN-HOSPITAL MEDICATIONS: 1. Enoxaparin 40 mg a day. 2. Aspirin 81 a day. 3. Lasix 40 IV b.i.d. 4. Lisinopril 10 daily which is recently decreased to 5 daily. 5. Potassium 20 b.i.d.  ALLERGIES:  Possible PENICILLIN.  SOCIAL HISTORY:  He is divorced.  He has children.  He works as a Education officer, environmental person in several office buildings.  Used to smoke a pack to pack and a half of cigarettes a day and now about a quarter pack a day. He drinks about 12 beers on the weekends.  FAMILY HISTORY:  Father died at a young age of unknown cause.  Mother is  alive and has end-stage renal disease.  PHYSICAL EXAMINATION:  GENERAL:  He is lying flat in bed, in no acute distress. RESPIRATIONS:  Unlabored. VITAL SIGNS:  Blood pressure is 117/82 with a heart rate of 94.  His respirations are 18.  He is satting 95% on room air.  Admission weight was 103 kg, now is 101 kg. HEENT:  Normal. NECK:  Supple.  JVP is about 7-8 cm of water.  Carotids are 1+ bilaterally without bruits.  There is no lymphadenopathy or thyromegaly. CARDIAC:  PMI is laterally displaced.  He is regular with an S3.  There is a 2/6 holosystolic murmur at the apex. LUNGS:  Clear. ABDOMEN:  Obese, nontender, nondistended.  No hepatosplenomegaly, bruits, or masses appreciated. EXTREMITIES:  Warm with no significant cyanosis, clubbing, or edema. NEURO:  Alert and oriented x3.  Cranial nerves II-XII are intact.  Moves all fours without difficulty.  Affect is pleasant.  EKG  shows sinus rhythm at a rate of 95.  There is LVH with nonspecific T- wave abnormalities.  There is also evidence of biatrial enlargement. Chest x-ray on admission shows cardiomegaly with pulmonary edema.  He had a CT angiography of the chest which showed mild emphysema.  There was no pulmonary embolus.  There was scattered plaquing of aortic plaquing.  No aneurysm.  Labs show BNP of 165 which is down from 900 on admission.  Sodium is 138, potassium 4.3, BUN 17, creatinine 1.07.  Troponins have been negative.  TSH is normal.  White count is 6.0, hemoglobin 14.1, platelet 212.  Magnesium is 2.1.  He has had lower extremity Dopplers which were negative.  ASSESSMENT: 1. Acute systolic heart failure with ejection fraction approximately     25% and biventricular dysfunction. 2. Ongoing tobacco use. 3. Alcohol abuse. 4. History of rheumatic heart disease.  PLAN/DISCUSSION:  Shawn Perkins volume status is currently improved.  His echo shows a markedly dilated LV with biventricular dysfunction and moderate to severe mitral regurgitation.  I suspect this is a nonischemic cardiomyopathy possibly due to alcohol abuse or untreated hypertension, though his blood pressure is well controlled now.  He is scheduled for right and left heart cath tomorrow.  We will await the results of this.  We will cut back his Lasix given his hypotension and start him on spironolactone and low-dose beta-blocker.  I counseled him the need for complete cessation from alcohol.  He will need close follow-up in the Heart Failure Clinic for medical titration.  We will follow.  We appreciate the consult.  Please do not hesitate to call with questions.     Shawn Buckles. Shawn Couzens, MD     DRB/MEDQ  D:  11/10/2010  T:  11/10/2010  Job:  [id number]  Electronically Signed by Shawn Meres MD on 12/04/2010 02:41:17 PM

## 2010-12-04 NOTE — Discharge Summary (Signed)
Shawn Perkins, Shawn Perkins NO.:  [id number]  MEDICAL RECORD NO.:  [id number]  LOCATION:  4737                         FACILITY:  [facility]  PHYSICIAN:  Bevelyn Buckles. Gao Mitnick, MDDATE OF BIRTH:  [birth year]  DATE OF ADMISSION:  11/08/2010 DATE OF DISCHARGE:  11/14/2010                              DISCHARGE SUMMARY   DISCHARGE DIAGNOSES: 1. Acute systolic heart failure. 2. Hypertension.  ALLERGIES:  No known allergies.  HISTORY OF PRESENT ILLNESS:  Shawn Perkins is a 55 year old African American male that was admitted to [facility] due to shortness of breath.  He has not been seen by primary care physician for a number of years; however, he does state that he has a history of hypertension and has not been taking any medications for this in sometime.  He does have a past medical history of rheumatic fever in the 60s with a significant stay in [facility].  He has a past medical history of exertional dyspnea when walking uphills or stairs.  This became progressively worse over the last 2 months and he developed orthopnea and right and left lower extremity edema.  He works as a Production designer, theatre/television/film man and walks several miles a day to work, but had progressive problems with this due to shortness of breath and he therefore sought medical attention in the [facility] Emergency Department.  Shawn Perkins was initially admitted by the [facility] for diagnosis of acute heart failure.  After initial workup, the heart failure team was consulted for management of this new diagnosis.  While hospitalized, he was noted to have an EF of 20%.  His EKG did reveal left ventricular hypertrophy with nonspecific T-wave abnormalities.  On admission, his pro-BNP was 986 and his admitting weight 103 kg.  He did receive a heart catheterization November 11, 2010, which revealed EF of 20%, RA 7, RV 51/4, PA 61/30, with a mean of 47, pulmonary capillary wedge pressure mean 30.  Cardiac  output was 4.0, SVR was 14/48.  After the results of the cardiac cath, he was initiated on afterload dosage with ACE inhibitors, Lasix for diuresis, and spironolactone.  He has done quite well when hospitalized.  His medications has been titrated for discharge.  His discharge weight is 99.8 kg.  He denies shortness of breath and chest pain.  He will be followed closely in the Heart Failure Clinic for medication titration.  The social workers will provide him a scale for daily weight and our pharmacist will also provide him medications for discharge as he is indigent.  PROCEDURE:  Left and right heart catheterization and coronary arteriography.  HEMODYNAMICS:  RA 7, RV 51/4, mean 61/30, pulmonary capillary wedge pressure is 30, LV is 107/25, cardiac output 4.5, SVR is 14/48, and left main is normal.  EF 20%.  LABORATORY DATA:  November 02, 2010, sodium 134, potassium 4.2, chloride is 99, glucose is 93, BUN is 21, creatinine is 1.33.  November 10, 2010, pro-BNP is 165, which is decreased from the admitting pro-BNP, which was 986.4.  RADIOLOGY:  November 09, 2010, CT chest, no pulmonary embolism identified.  Chest x-ray November 09, 2010, cardiomegaly and interstitial edema.  DISCHARGE MEDICATIONS: 1. Carvedilol 3.125 mg by mouth twice daily. 2. Lasix 40 mg by mouth daily. 3. Lisinopril 10 mg by mouth twice daily. 4. Spironolactone 25 mg by mouth daily.  DISCHARGE INSTRUCTIONS: 1. Diet:  Low sodium 1500 mg diet, no alcohol, and 1500 mL fluid     restriction. 2. Activity:  Increase activity slowly.  FOLLOWUP APPOINTMENTS: 1. [facility], Dr. Allena Katz, [phone] on December 13, 2010, at 10 a.m. 2. Dr. Gala Romney, Heart Failure Clinic [phone] on November 21, 2010, at     10 a.m.  SPECIAL INSTRUCTIONS:  He is instructed to weigh and record weights daily.  If his weight is increased 3 pounds, he is to contact the Heart Failure Clinic at [phone] or if he has shortness of  breath.  Pharmacy will assist with discharge medications.  At the time of discharge, Shawn Perkins has met maximum benefit of his acute care stay.  He was seen and evaluated by Dr. Gala Romney and deemed appropriate for discharge.  He will be discharged home today.     Tonye Becket, NP   ______________________________ Bevelyn Buckles. Shawn Neuberger, MD    AC/MEDQ  D:  11/14/2010  T:  11/14/2010  Job:  [id number]  Electronically Signed by Arvilla Meres MD on 12/04/2010 02:41:04 PM

## 2010-12-18 NOTE — Progress Notes (Signed)
Patient seen and examined with Amy Clegg, NP. We discussed all aspects of the encounter. I agree with the assessment and plan as stated above.   

## 2010-12-19 ENCOUNTER — Encounter (HOSPITAL_COMMUNITY): Payer: Self-pay

## 2010-12-19 ENCOUNTER — Ambulatory Visit (HOSPITAL_COMMUNITY)
Admission: RE | Admit: 2010-12-19 | Discharge: 2010-12-19 | Disposition: A | Discharge status: Home / Self Care | Payer: Self-pay | Source: Ambulatory Visit | Attending: Internal Medicine | Admitting: Internal Medicine

## 2010-12-19 DIAGNOSIS — I5020 Unspecified systolic (congestive) heart failure: Secondary | ICD-10-CM | POA: Insufficient documentation

## 2010-12-19 DIAGNOSIS — Z79899 Other long term (current) drug therapy: Secondary | ICD-10-CM | POA: Insufficient documentation

## 2010-12-19 DIAGNOSIS — I5022 Chronic systolic (congestive) heart failure: Secondary | ICD-10-CM

## 2010-12-19 DIAGNOSIS — F17200 Nicotine dependence, unspecified, uncomplicated: Secondary | ICD-10-CM | POA: Insufficient documentation

## 2010-12-19 DIAGNOSIS — I10 Essential (primary) hypertension: Secondary | ICD-10-CM | POA: Insufficient documentation

## 2010-12-19 DIAGNOSIS — Z720 Tobacco use: Secondary | ICD-10-CM | POA: Insufficient documentation

## 2010-12-19 MED ORDER — FUROSEMIDE 40 MG PO TABS: ORAL_TABLET | ORAL | Status: DC

## 2010-12-19 MED ORDER — CARVEDILOL 6.25 MG PO TABS: ORAL_TABLET | ORAL | Status: DC

## 2010-12-19 NOTE — Progress Notes (Signed)
Patient seen and examined with Amy Clegg, NP. We discussed all aspects of the encounter. I agree with the assessment and plan as stated above.   

## 2010-12-19 NOTE — Assessment & Plan Note (Addendum)
He continues to smoke 2 cigarettes. He has decreased his usage and plans to quit.

## 2010-12-19 NOTE — Progress Notes (Signed)
   HPI:  Shawn Perkins is a 55 year old African  American male with a h/o severe HTN, tobacco and ETOH use who was admitted to [facility] in September 2012 due to ADHF.     Workup showed EF 20%. Underwent cath (11/11/10) which showed normal cors. EF 20% RA 7, RV 51/4, PA 61/30, with a mean of 47, pulmonary capillary   wedge pressure mean 30.  Cardiac output was 4.0.  He was diuresed aggressively and lisinopril, spiro, carvedilol and lasix. His discharge weight was 99.8 kg.   Spironolactone stopped in early October due to a potassium 6.2   He returns for f/u. He feels better. Just dizzy  when he bends over. Last visit Carvedilol increased last visit and he has tolerated the increase. No CP, SOB or orthopnea. He has been very compliant with his medications. Weight is stable at 216-217 pounds.  Able to sleep on one pillow flat in bed.  Continues to walk 3-4 miles a day. Smokes 1-2  cigarettes. He is now being followed at [facility] and he is able to obtain his medications.   ROS: All systems negative except as listed in HPI, PMH and Problem List.  Past Medical History  Diagnosis Date  . Systolic heart failure     11/09/10 echo with EF 35%, 11/11/10 cath with EF 20%  . Hypertension   . Rheumatic fever     as a child  . History of cocaine abuse     use to smoke crack cocaine, last use was more than 6 years ago    Current Outpatient Prescriptions  Medication Sig Dispense Refill  . carvedilol (COREG) 6.25 MG tablet Take 1 tablet (6.25 mg total) by mouth 2 (two) times daily with a meal.  60 tablet  6  . furosemide (LASIX) 40 MG tablet Take 40 mg by mouth daily.        Marland Kitchen lisinopril (PRINIVIL,ZESTRIL) 10 MG tablet Take 10 mg by mouth daily.          PHYSICAL EXAM: Filed Vitals:   12/19/10 1231  BP: 127/83  Pulse: 94   General:  Well appearing. No resp difficulty HEENT: normal Neck: supple. JVP flat. Carotids 2+ bilaterally; no bruits. No lymphadenopathy or thryomegaly  appreciated. Cor: PMI normal. Regular rate & rhythm. No rubs, gallops or murmurs. Lungs: clear Abdomen: soft, nontender, nondistended. No hepatosplenomegaly. No bruits or masses. Good bowel sounds. Extremities: no cyanosis, clubbing, rash, no edema Neuro: alert & orientedx3, cranial nerves grossly intact. Moves all 4 extremities w/o difficulty. Affect pleasant.   ASSESSMENT & PLAN:

## 2010-12-19 NOTE — Assessment & Plan Note (Addendum)
NYHA II. Volume status stable. Tolerating medications but does have occasional dizziness. Will decrease Lasix to 20 mg daily. If weight increases 3 pounds in one day he will take an extra 20 mg of Lasix.  Increase Carvedilol 9.375 mg bid. He will obtain all medications from [facility].  Follow up 3-4 weeks.   Patient seen and examined with Tonye Becket, NP. We discussed all aspects of the encounter. I agree with the assessment and plan as stated above.

## 2010-12-19 NOTE — Patient Instructions (Addendum)
Please take Lasix 20 mg daily  If your weight increase 3 pounds in one day an extra 20mg  Lasix  Please take Carvedilol 9.37 mg in am and pm. (1 1/2 pills in am and pm)  Follow up 3-4 weeks

## 2011-01-09 ENCOUNTER — Telehealth (HOSPITAL_COMMUNITY): Payer: Self-pay | Admitting: *Deleted

## 2011-01-09 MED ORDER — FUROSEMIDE 40 MG PO TABS: 20.0000 mg | ORAL_TABLET | Freq: Every day | ORAL | Status: DC

## 2011-01-09 MED ORDER — CARVEDILOL 6.25 MG PO TABS: 9.3750 mg | ORAL_TABLET | Freq: Two times a day (BID) | ORAL | Status: DC

## 2011-01-09 NOTE — Telephone Encounter (Signed)
Refills sent in, pt is aware

## 2011-01-09 NOTE — Telephone Encounter (Signed)
Shawn Perkins called this am, he is in need for refills for his medication, Carvedilol, and Furosemide.  Please call him to let them know you have ordered them.

## 2011-01-16 ENCOUNTER — Encounter (HOSPITAL_COMMUNITY): Payer: Self-pay

## 2011-01-16 ENCOUNTER — Ambulatory Visit (HOSPITAL_COMMUNITY)
Admission: RE | Admit: 2011-01-16 | Discharge: 2011-01-16 | Disposition: A | Discharge status: Home / Self Care | Payer: Self-pay | Source: Ambulatory Visit | Attending: Internal Medicine | Admitting: Internal Medicine

## 2011-01-16 VITALS — BP 100/68 | HR 87 | Wt 221.0 lb

## 2011-01-16 DIAGNOSIS — Z720 Tobacco use: Secondary | ICD-10-CM

## 2011-01-16 DIAGNOSIS — I5022 Chronic systolic (congestive) heart failure: Secondary | ICD-10-CM

## 2011-01-16 DIAGNOSIS — F17200 Nicotine dependence, unspecified, uncomplicated: Secondary | ICD-10-CM | POA: Insufficient documentation

## 2011-01-16 MED ORDER — CARVEDILOL 6.25 MG PO TABS: 12.5000 mg | ORAL_TABLET | Freq: Two times a day (BID) | ORAL | Status: DC

## 2011-01-16 NOTE — Patient Instructions (Addendum)
ECHO Monday December 10  Take Carvedilol 12.5 mg  Bid  Continue to weigh daily  Follow up in  4 weeks.

## 2011-01-16 NOTE — Progress Notes (Signed)
Patient ID: Shawn Perkins, male   DOB: Jan 23, 1956, 55 y.o.   MRN: [id number]   HPI:  Shawn Perkins is a 55 year old African  American male with a h/o severe HTN, tobacco and ETOH use who was admitted to [facility] in September 2012 due to ADHF.     Workup showed EF 20%. Underwent cath (11/11/10) which showed normal cors. EF 20% RA 7, RV 51/4, PA 61/30, with a mean of 47, pulmonary capillary   wedge pressure mean 30.  Cardiac output was 4.0.  He was diuresed aggressively and lisinopril, spiro, carvedilol and lasix. His discharge weight was 99.8 kg.   Spironolactone stopped in early October due to a potassium 6.2   He returns for f/u. He feels better.  Last visit Carvedilol increased last visit and he has tolerated the increase. No CP/ SOB/orthopnea. He has been very compliant with his medications. Weight is stable at 218 pounds.  He is increased Lasix over Thanksgiving. Able to sleep on one pillow flat in bed.  Continues to walk 3-4 miles a day. Smokes 2  Cigarettes. Tried to stop but his appetite increased.  He did have 12  beers over Thanksgivning. He is followed at [facility] and will receive medications with orange card.   ROS: All systems negative except as listed in HPI, PMH and Problem List.  Past Medical History  Diagnosis Date  . Systolic heart failure     11/09/10 echo with EF 35%, 11/11/10 cath with EF 20%  . Hypertension   . Rheumatic fever     as a child  . History of cocaine abuse     use to smoke crack cocaine, last use was more than 6 years ago    Current Outpatient Prescriptions  Medication Sig Dispense Refill  . carvedilol (COREG) 6.25 MG tablet Take 1.5 tablets (9.375 mg total) by mouth 2 (two) times daily with a meal.  60 tablet  6  . furosemide (LASIX) 40 MG tablet Take 0.5 tablets (20 mg total) by mouth daily.  30 tablet  6  . lisinopril (PRINIVIL,ZESTRIL) 10 MG tablet Take 10 mg by mouth daily.          PHYSICAL EXAM: Filed Vitals:   01/16/11 1126  BP:  100/68  Pulse: 87   General:  Well appearing. No resp difficulty HEENT: normal Neck: supple. JVP 5-6 Carotids 2+ bilaterally; no bruits. No lymphadenopathy or thryomegaly appreciated. Cor: PMI normal. Regular rate & rhythm. No rubs, gallops or murmurs. Lungs: clear Abdomen: soft, nontender, nondistended. No hepatosplenomegaly. No bruits or masses. Good bowel sounds. Extremities: no cyanosis, clubbing, rash, no edema Neuro: alert & orientedx3, cranial nerves grossly intact. Moves all 4 extremities w/o difficulty. Affect pleasant.   ASSESSMENT & PLAN:

## 2011-01-16 NOTE — Assessment & Plan Note (Signed)
Discussed smoking cessation however he declines.  

## 2011-01-16 NOTE — Assessment & Plan Note (Addendum)
NYHA II. Volume status stable. Will increase Carvedilol 12.5 mg bid.Will repeat ECHO next month.  Follow up in 4 weeks.   Patient seen and examined with Tonye Becket, NP. We discussed all aspects of the encounter. I agree with the assessment and plan as stated above. Use of sliding scale diuretics reinforced. Continue to titrate meds as BP tolerates.

## 2011-01-20 NOTE — Progress Notes (Signed)
Patient seen and examined with Amy Clegg, NP. We discussed all aspects of the encounter. I agree with the assessment and plan as stated above.   

## 2011-01-26 ENCOUNTER — Telehealth (HOSPITAL_COMMUNITY): Payer: Self-pay | Admitting: *Deleted

## 2011-01-26 MED ORDER — FUROSEMIDE 40 MG PO TABS: 20.0000 mg | ORAL_TABLET | Freq: Every day | ORAL | Status: DC

## 2011-01-26 MED ORDER — CARVEDILOL 12.5 MG PO TABS: 12.5000 mg | ORAL_TABLET | Freq: Two times a day (BID) | ORAL | Status: DC

## 2011-01-26 NOTE — Telephone Encounter (Signed)
Shawn Perkins called this afternoon requesting refills on his medicaitons.  He needs carvedilol and furosemide to be refilled.  He said that he is using [facility], but doesn't have the phone number. Thanks!

## 2011-01-26 NOTE — Telephone Encounter (Signed)
Spoke w/pt he would like refills sent to [facility] pharmacy, he is aware they have been sent in

## 2011-02-06 ENCOUNTER — Ambulatory Visit (HOSPITAL_COMMUNITY)
Admission: RE | Admit: 2011-02-06 | Discharge: 2011-02-06 | Disposition: A | Discharge status: Home / Self Care | Payer: Self-pay | Source: Ambulatory Visit | Attending: Internal Medicine | Admitting: Internal Medicine

## 2011-02-06 ENCOUNTER — Encounter (HOSPITAL_COMMUNITY): Payer: Self-pay

## 2011-02-06 VITALS — BP 110/70 | HR 74 | Wt 225.0 lb

## 2011-02-06 DIAGNOSIS — Z720 Tobacco use: Secondary | ICD-10-CM

## 2011-02-06 DIAGNOSIS — F17200 Nicotine dependence, unspecified, uncomplicated: Secondary | ICD-10-CM | POA: Insufficient documentation

## 2011-02-06 DIAGNOSIS — I059 Rheumatic mitral valve disease, unspecified: Secondary | ICD-10-CM

## 2011-02-06 DIAGNOSIS — I5022 Chronic systolic (congestive) heart failure: Secondary | ICD-10-CM

## 2011-02-06 MED ORDER — LOSARTAN POTASSIUM 50 MG PO TABS: 50.0000 mg | ORAL_TABLET | Freq: Every day | ORAL | Status: DC

## 2011-02-06 NOTE — Progress Notes (Signed)
  Echocardiogram 2D Echocardiogram has been performed.  Shawn Perkins 02/06/2011, 9:44 AM

## 2011-02-06 NOTE — Patient Instructions (Addendum)
Do the following things EVERYDAY: 1) Weigh yourself in the morning before breakfast. Write it down and keep it in a log. 2) Take your medicines as prescribed 3) Eat low salt foods-Limit salt (sodium) to 2000mg  per day.  4) Stay as active as you can everyday    Follow up in 2  months   STOP Lisinopril  Start Losartan 50 mg daily (If you get dizzy take a half a tablet)

## 2011-02-06 NOTE — Progress Notes (Signed)
Encounter addended by: Noralee Space, RN on: 02/06/2011  3:38 PM<BR>     Documentation filed: Orders

## 2011-02-06 NOTE — Assessment & Plan Note (Addendum)
Continue to encourage complete smoking cessation. Congratulated him on reduced cigarette use. Suggested using electronic cigarette.

## 2011-02-06 NOTE — Assessment & Plan Note (Addendum)
NYHA I.  Volume status stable. ECHO improved EF 55-60%. Continue Lasix 40 mg daily. Will re-check BMET in 2 weeks. May be able to decrease lasix as his EF is normal.   Follow up in 2 months.   Patient seen and examined with Tonye Becket, NP. We discussed all aspects of the encounter. I agree with the assessment and plan as stated above. We reviewed echo together which showed normal LV function. +LVH. Mild AI. He is having ACE-I cough will switch lisinopril to Losartan 50. We checked with [facility] and it is $25 for 3 months so should be able to afford. May need to cut lasix down. Check BMET in 1-2 weeks. Reinforced need to abstain from ETOH.

## 2011-02-06 NOTE — Progress Notes (Signed)
Patient ID: Shawn Perkins, male   DOB: 1956/02/13, 55 y.o.   MRN: [id number] Patient ID: Shawn Perkins, male   DOB: 19-Jul-1955, 55 y.o.   MRN: [id number]   HPI:  [name] is a 55 year old African  American male with a h/o severe HTN, tobacco and ETOH use who was admitted to [facility] in September 2012 due to ADHF.     Workup showed EF 20%. Underwent cath (11/11/10) which showed normal cors. EF 20% RA 7, RV 51/4, PA 61/30, with a mean of 47, pulmonary capillary   wedge pressure mean 30.  Cardiac output was 4.0.  He was diuresed aggressively and lisinopril, spiro, carvedilol and lasix. His discharge weight was 99.8 kg.   Spironolactone stopped in early October due to a potassium 6.2  02/06/2011 ECHO 55-60% He returns for f/u. He feels better.  Complains of dry cough at night. Last visit Carvedilol increased 12.5 mg bid and he has tolerated the increase. No CP/ SOB/orthopnea. He has been very compliant with his medications. Weight is increased 228. He is taking 40 mg Lasix daily now due to weight gain.  Able to sleep on one pillow flat in bed.  Continues to walk 3-4 miles a day. Decreasing cigarette use. He is down 2 per day. Appetite increased.  He has not had any alcohol over the last few weeks. He is followed at [facility] and  will receive medications with orange card.   ROS: All systems negative except as listed in HPI, PMH and Problem List.  Past Medical History  Diagnosis Date  . Systolic heart failure     11/09/10 echo with EF 35%, 11/11/10 cath with EF 20%  . Hypertension   . Rheumatic fever     as a child  . History of cocaine abuse     use to smoke crack cocaine, last use was more than 6 years ago    Current Outpatient Prescriptions  Medication Sig Dispense Refill  . carvedilol (COREG) 12.5 MG tablet Take 1 tablet (12.5 mg total) by mouth 2 (two) times daily with a meal.  60 tablet  6  . furosemide (LASIX) 40 MG tablet Take 0.5 tablets (20 mg total) by mouth daily.  30  tablet  6  . lisinopril (PRINIVIL,ZESTRIL) 10 MG tablet Take 10 mg by mouth daily.          PHYSICAL EXAM: Filed Vitals:   02/06/11 0959  BP: 110/70  Pulse: 74  Weigh 225 pounds (221) General:  Well appearing. No resp difficulty HEENT: normal Neck: supple. JVP 7-8 Carotids 2+ bilaterally; no bruits. No lymphadenopathy or thryomegaly appreciated. Cor: PMI normal. Regular rate & rhythm. No rubs, gallops or murmurs. Lungs: clear Abdomen: soft, nontender, nondistended. No hepatosplenomegaly. No bruits or masses. Good bowel sounds. Extremities: no cyanosis, clubbing, rash, no edema Neuro: alert & orientedx3, cranial nerves grossly intact. Moves all 4 extremities w/o difficulty. Affect pleasant.   ASSESSMENT & PLAN:

## 2011-02-24 ENCOUNTER — Telehealth (HOSPITAL_COMMUNITY): Payer: Self-pay | Admitting: *Deleted

## 2011-02-24 MED ORDER — CARVEDILOL 12.5 MG PO TABS: 12.5000 mg | ORAL_TABLET | Freq: Two times a day (BID) | ORAL | Status: DC

## 2011-02-24 NOTE — Telephone Encounter (Signed)
Shawn Perkins called this am, he needs a refill on his carvedilol to be called in to his pharmacy.  Thank you!

## 2011-02-24 NOTE — Telephone Encounter (Signed)
Carvedilol re-ordered as requested.

## 2011-03-08 ENCOUNTER — Telehealth (HOSPITAL_COMMUNITY): Payer: Self-pay | Admitting: *Deleted

## 2011-03-08 MED ORDER — LOSARTAN POTASSIUM 50 MG PO TABS: 50.0000 mg | ORAL_TABLET | Freq: Every day | ORAL | Status: DC

## 2011-03-08 MED ORDER — FUROSEMIDE 40 MG PO TABS: 40.0000 mg | ORAL_TABLET | Freq: Every day | ORAL | Status: DC

## 2011-03-08 NOTE — Telephone Encounter (Signed)
Prescriptions sent in pt aware

## 2011-03-08 NOTE — Telephone Encounter (Signed)
Mr Boeding called today needing refills for losartan and furosemide to be called into [facility] pharmacy.  Thanks!

## 2011-03-23 ENCOUNTER — Telehealth (HOSPITAL_COMMUNITY): Payer: Self-pay | Admitting: *Deleted

## 2011-03-23 MED ORDER — CARVEDILOL 12.5 MG PO TABS: 12.5000 mg | ORAL_TABLET | Freq: Two times a day (BID) | ORAL | Status: DC

## 2011-03-23 NOTE — Telephone Encounter (Signed)
Pt aware prescription sent to pharmacy °

## 2011-03-23 NOTE — Telephone Encounter (Signed)
Mr Beals called today.  He needs a refill for his carvedilol to be called into his pharmacy.  Thanks.

## 2011-04-06 ENCOUNTER — Telehealth (HOSPITAL_COMMUNITY): Payer: Self-pay | Admitting: *Deleted

## 2011-04-06 NOTE — Telephone Encounter (Signed)
Advised pt that he has refills to call pharmacy he is aware and agreeable

## 2011-04-06 NOTE — Telephone Encounter (Signed)
Shawn Perkins called today for a few refills to be called in for him.  He needs losartan potassium, and furosemide.  Thanks.

## 2011-04-17 ENCOUNTER — Ambulatory Visit (HOSPITAL_COMMUNITY)
Admission: RE | Admit: 2011-04-17 | Discharge: 2011-04-17 | Disposition: A | Discharge status: Home / Self Care | Payer: Self-pay | Source: Ambulatory Visit | Attending: Internal Medicine | Admitting: Internal Medicine

## 2011-04-17 ENCOUNTER — Encounter (HOSPITAL_COMMUNITY): Payer: Self-pay

## 2011-04-17 VITALS — BP 110/70 | HR 67 | Wt 229.5 lb

## 2011-04-17 DIAGNOSIS — F17200 Nicotine dependence, unspecified, uncomplicated: Secondary | ICD-10-CM | POA: Insufficient documentation

## 2011-04-17 DIAGNOSIS — I5022 Chronic systolic (congestive) heart failure: Secondary | ICD-10-CM | POA: Insufficient documentation

## 2011-04-17 DIAGNOSIS — Z720 Tobacco use: Secondary | ICD-10-CM

## 2011-04-17 NOTE — Assessment & Plan Note (Signed)
Encouraged complete cessation. 

## 2011-04-17 NOTE — Progress Notes (Signed)
Patient ID: MILLAN LEGAN, male   DOB: [birth year], 56 y.o.   MRN: [id number]  HPI:  Mr. Neisler is a 56 year old African  American male with a h/o severe HTN, tobacco and ETOH use who was admitted to [facility] in September 2012 due to ADHF.     Workup showed EF 20%. Underwent cath (11/11/10) which showed normal cors. EF 20% RA 7, RV 51/4, PA 61/30, with a mean of 47, pulmonary capillary   wedge pressure mean 30.  Cardiac output was 4.0.  He was diuresed aggressively and lisinopril, spiro, carvedilol and lasix. His discharge weight was 99.8 kg.   Spironolactone stopped in early October due to a potassium 6.2  02/06/2011 ECHO 55-60%  He returns for f/u. He feels better.   Last visit Lisinoprill stopped due to cough and Cozaar initiated. Denies cough/CP/SOB/dizzniess.  He has not requried any extra Lasix.  He has been very compliant with his medications and able to pay for medications. Weight at home 223.   Able to sleep on one pillow flat in bed.  Continues to walk 3-4 miles a day. Decreasing cigarette use. He is down to 1 every other day. Appetite increased.  He has not had any alcohol over the last several . He is going to be followed at [facility]. He has received  orange card.   ROS: All systems negative except as listed in HPI, PMH and Problem List.  Past Medical History  Diagnosis Date  . Systolic heart failure     11/09/10 echo with EF 35%, 11/11/10 cath with EF 20%  . Hypertension   . Rheumatic fever     as a child  . History of cocaine abuse     use to smoke crack cocaine, last use was more than 6 years ago    Current Outpatient Prescriptions  Medication Sig Dispense Refill  . carvedilol (COREG) 12.5 MG tablet Take 1 tablet (12.5 mg total) by mouth 2 (two) times daily with a meal.  60 tablet  6  . furosemide (LASIX) 40 MG tablet Take 1 tablet (40 mg total) by mouth daily.  30 tablet  6  . losartan (COZAAR) 50 MG tablet Take 1 tablet (50 mg total) by mouth daily.  30 tablet   6     PHYSICAL EXAM: Filed Vitals:   04/17/11 0849  BP: 110/70  Pulse: 67  Weigh 229 (225 pounds) General:  Well appearing. No resp difficulty HEENT: normal Neck: supple. JVP 5 Carotids 2+ bilaterally; no bruits. No lymphadenopathy or thryomegaly appreciated. Cor: PMI normal. Regular rate & rhythm. No rubs, gallops or murmurs. Lungs: clear Abdomen: soft, nontender, nondistended. No hepatosplenomegaly. No bruits or masses. Good bowel sounds. Extremities: no cyanosis, clubbing, rash, no edema Neuro: alert & orientedx3, cranial nerves grossly intact. Moves all 4 extremities w/o difficulty. Affect pleasant.   ASSESSMENT & PLAN:

## 2011-04-17 NOTE — Patient Instructions (Signed)
Do the following things EVERYDAY: 1) Weigh yourself in the morning before breakfast. Write it down and keep it in a log. 2) Take your medicines as prescribed 3) Eat low salt foods--Limit salt (sodium) to 2000mg  per day.  4) Stay as active as you can everyday  Follow up in 4 months.

## 2011-04-17 NOTE — Assessment & Plan Note (Addendum)
NYHA I. Volume status stable. Tolerating Cozaar. No dizziness/cough.  Continue current medications. Follow up in 4 months.   Patient seen and examined with Tonye Becket, NP. We discussed all aspects of the encounter. I agree with the assessment and plan as stated above.   Doing well. EF normalized. No evidence of HF on exam. Will continue current regimen. Stressed need to refrain from ETOH and also continue to work on smoking cessation.

## 2011-08-11 ENCOUNTER — Ambulatory Visit (HOSPITAL_COMMUNITY)
Admission: RE | Admit: 2011-08-11 | Discharge: 2011-08-11 | Disposition: A | Discharge status: Home / Self Care | Payer: Self-pay | Source: Ambulatory Visit | Attending: Internal Medicine | Admitting: Internal Medicine

## 2011-08-11 ENCOUNTER — Encounter (HOSPITAL_COMMUNITY): Payer: Self-pay

## 2011-08-11 VITALS — BP 130/80 | HR 73 | Resp 18 | Ht 71.0 in | Wt 227.8 lb

## 2011-08-11 DIAGNOSIS — I5022 Chronic systolic (congestive) heart failure: Secondary | ICD-10-CM

## 2011-08-11 NOTE — Assessment & Plan Note (Signed)
Continue to reduce cigarette use. Encouraged smoking cessation.

## 2011-08-11 NOTE — Progress Notes (Signed)
Patient ID: Shawn Perkins, male   DOB: 1955/07/06, 56 y.o.   MRN: [id number]  HPI:  Shawn Perkins is a 56 year old African  American male with a h/o severe HTN, tobacco and ETOH use who was admitted to [facility] in September 2012 due to ADHF.     Workup showed EF 20%. Underwent cath (11/11/10) which showed normal cors. EF 20% RA 7, RV 51/4, PA 61/30, with a mean of 47, pulmonary capillary   wedge pressure mean 30.  Cardiac output was 4.0.  He was diuresed aggressively and lisinopril, spiro, carvedilol and lasix. His discharge weight was 99.8 kg.   Spironolactone stopped in early October due to a potassium 6.2  02/06/2011 ECHO 55-60%  He returns for follow up. Denies SOB/PND/Orthopnea. Weighs every other day 219-227. Complaint with medications. Continues to smoke 1 cigarette every other week. Occasional alcohol. Walks at least 4 miles daily.   ROS: All systems negative except as listed in HPI, PMH and Problem List.  Past Medical History  Diagnosis Date  . Systolic heart failure     11/09/10 echo with EF 35%, 11/11/10 cath with EF 20%  . Hypertension   . Rheumatic fever     as a child  . History of cocaine abuse     use to smoke crack cocaine, last use was more than 6 years ago    Current Outpatient Prescriptions  Medication Sig Dispense Refill  . carvedilol (COREG) 12.5 MG tablet Take 1 tablet (12.5 mg total) by mouth 2 (two) times daily with a meal.  60 tablet  6  . furosemide (LASIX) 40 MG tablet Take 1 tablet (40 mg total) by mouth daily.  30 tablet  6  . losartan (COZAAR) 50 MG tablet Take 1 tablet (50 mg total) by mouth daily.  30 tablet  6     PHYSICAL EXAM: Filed Vitals:   08/11/11 0906  BP: 130/80  Pulse: 73  Resp: 18  Weigh 227 (229 pounds) General:  Well appearing. No resp difficulty HEENT: normal Neck: supple. JVP 5 Carotids 2+ bilaterally; no bruits. No lymphadenopathy or thryomegaly appreciated. Cor: PMI normal. Regular rate & rhythm. No rubs, gallops or  murmurs. Lungs: clear Abdomen: soft, nontender, nondistended. No hepatosplenomegaly. No bruits or masses. Good bowel sounds. Extremities: no cyanosis, clubbing, rash, no edema Neuro: alert & orientedx3, cranial nerves grossly intact. Moves all 4 extremities w/o difficulty. Affect pleasant.   ASSESSMENT & PLAN:

## 2011-08-11 NOTE — Assessment & Plan Note (Addendum)
NYHA I. Volume status stable. Continue current regimen.Reinforced medication compliance, low salt diet and fluid restriction.SBP 130.  Follow up in 4 months and re-evaluate blood pressure.   Patient seen and examined with Tonye Becket, NP. We discussed all aspects of the encounter. I agree with the assessment and plan as stated above.  Ef recovered by echo 12/12. EF 50-55%. Doing very well. Reinforced need for med compliance and continued ETOH abstinence.

## 2011-08-11 NOTE — Patient Instructions (Addendum)
Follow up 4 months.  Do the following things EVERYDAY: 1) Weigh yourself in the morning before breakfast. Write it down and keep it in a log. 2) Take your medicines as prescribed 3) Eat low salt foods-Limit salt (sodium) to 2000 mg per day.  4) Stay as active as you can everyday 5) Limit all fluids for the day to less than 2 liters  

## 2011-10-05 ENCOUNTER — Other Ambulatory Visit (HOSPITAL_COMMUNITY): Payer: Self-pay | Admitting: *Deleted

## 2011-10-05 MED ORDER — CARVEDILOL 12.5 MG PO TABS: 12.5000 mg | ORAL_TABLET | Freq: Two times a day (BID) | ORAL | Status: DC

## 2011-10-05 MED ORDER — FUROSEMIDE 40 MG PO TABS: 40.0000 mg | ORAL_TABLET | Freq: Every day | ORAL | Status: DC

## 2011-10-05 MED ORDER — LOSARTAN POTASSIUM 50 MG PO TABS: 50.0000 mg | ORAL_TABLET | Freq: Every day | ORAL | Status: DC

## 2012-02-14 ENCOUNTER — Encounter (HOSPITAL_COMMUNITY): Payer: Self-pay

## 2012-02-14 ENCOUNTER — Emergency Department (HOSPITAL_COMMUNITY)
Admission: EM | Admit: 2012-02-14 | Discharge: 2012-02-15 | Disposition: A | Discharge status: Home / Self Care | Payer: Self-pay | Attending: Emergency Medicine | Admitting: Emergency Medicine

## 2012-02-14 DIAGNOSIS — I10 Essential (primary) hypertension: Secondary | ICD-10-CM | POA: Insufficient documentation

## 2012-02-14 DIAGNOSIS — K429 Umbilical hernia without obstruction or gangrene: Secondary | ICD-10-CM | POA: Insufficient documentation

## 2012-02-14 DIAGNOSIS — I5020 Unspecified systolic (congestive) heart failure: Secondary | ICD-10-CM | POA: Insufficient documentation

## 2012-02-14 DIAGNOSIS — F17200 Nicotine dependence, unspecified, uncomplicated: Secondary | ICD-10-CM | POA: Insufficient documentation

## 2012-02-14 DIAGNOSIS — F1410 Cocaine abuse, uncomplicated: Secondary | ICD-10-CM | POA: Insufficient documentation

## 2012-02-14 DIAGNOSIS — Z8679 Personal history of other diseases of the circulatory system: Secondary | ICD-10-CM | POA: Insufficient documentation

## 2012-02-14 DIAGNOSIS — Z79899 Other long term (current) drug therapy: Secondary | ICD-10-CM | POA: Insufficient documentation

## 2012-02-14 DIAGNOSIS — R112 Nausea with vomiting, unspecified: Secondary | ICD-10-CM | POA: Insufficient documentation

## 2012-02-14 LAB — CBC WITH DIFFERENTIAL/PLATELET
Basophils Relative: 1 % (ref 0–1)
Eosinophils Absolute: 0.3 10*3/uL (ref 0.0–0.7)
Eosinophils Relative: 3 % (ref 0–5)
Hemoglobin: 14.4 g/dL (ref 13.0–17.0)
MCH: 34.4 pg — ABNORMAL HIGH (ref 26.0–34.0)
MCHC: 35.2 g/dL (ref 30.0–36.0)
MCV: 97.6 fL (ref 78.0–100.0)
Monocytes Absolute: 0.5 10*3/uL (ref 0.1–1.0)
Monocytes Relative: 4 % (ref 3–12)
Neutrophils Relative %: 55 % (ref 43–77)

## 2012-02-14 LAB — URINALYSIS, ROUTINE W REFLEX MICROSCOPIC
Ketones, ur: NEGATIVE mg/dL
Leukocytes, UA: NEGATIVE
Nitrite: NEGATIVE
Specific Gravity, Urine: 1.011 (ref 1.005–1.030)
Urobilinogen, UA: 0.2 mg/dL (ref 0.0–1.0)
pH: 6 (ref 5.0–8.0)

## 2012-02-14 LAB — COMPREHENSIVE METABOLIC PANEL
Albumin: 3.8 g/dL (ref 3.5–5.2)
BUN: 9 mg/dL (ref 6–23)
Creatinine, Ser: 1.01 mg/dL (ref 0.50–1.35)
Total Bilirubin: 0.2 mg/dL — ABNORMAL LOW (ref 0.3–1.2)
Total Protein: 7.5 g/dL (ref 6.0–8.3)

## 2012-02-14 LAB — POCT I-STAT, CHEM 8
Calcium, Ion: 1.13 mmol/L (ref 1.12–1.23)
Glucose, Bld: 119 mg/dL — ABNORMAL HIGH (ref 70–99)
HCT: 43 % (ref 39.0–52.0)
Hemoglobin: 14.6 g/dL (ref 13.0–17.0)
Potassium: 3.4 mEq/L — ABNORMAL LOW (ref 3.5–5.1)
TCO2: 24 mmol/L (ref 0–100)

## 2012-02-14 LAB — LIPASE, BLOOD: Lipase: 40 U/L (ref 11–59)

## 2012-02-14 LAB — RAPID URINE DRUG SCREEN, HOSP PERFORMED
Barbiturates: NOT DETECTED
Tetrahydrocannabinol: NOT DETECTED

## 2012-02-14 MED ORDER — ONDANSETRON HCL 4 MG/2ML IJ SOLN
4.0000 mg | Freq: Once | INTRAMUSCULAR | Status: AC
  Administered 2012-02-14: 4 mg via INTRAVENOUS
  Filled 2012-02-14: qty 2

## 2012-02-14 MED ORDER — IOHEXOL 300 MG/ML  SOLN
20.0000 mL | INTRAMUSCULAR | Status: AC
  Administered 2012-02-14: 20 mL via ORAL

## 2012-02-14 MED ORDER — HYDROMORPHONE HCL PF 1 MG/ML IJ SOLN
1.0000 mg | Freq: Once | INTRAMUSCULAR | Status: AC
  Administered 2012-02-14: 1 mg via INTRAVENOUS
  Filled 2012-02-14: qty 1

## 2012-02-14 MED ORDER — SODIUM CHLORIDE 0.9 % IV SOLN
1000.0000 mL | INTRAVENOUS | Status: DC
  Administered 2012-02-14: 1000 mL via INTRAVENOUS

## 2012-02-14 MED ORDER — SODIUM CHLORIDE 0.9 % IV SOLN
1000.0000 mL | Freq: Once | INTRAVENOUS | Status: AC
  Administered 2012-02-14: 1000 mL via INTRAVENOUS

## 2012-02-14 NOTE — ED Notes (Signed)
Pt A.O. X 4. Reports hxt of hernia for "several year and has never had it assessed my a doctor.  Soft ball sized hernia over umbilical region. Reports pain 10/10 in RUQ and LUQ. Pt on his knees. States "this is the only way I am comfortable. I am going to move around until I am comfortable". Denies N/V. Denies chest pain. Denies SOB. Vitals stable.

## 2012-02-14 NOTE — ED Provider Notes (Signed)
History    CSN: [id number] Arrival date & time 02/14/12  2030 First MD Initiated Contact with Patient 02/14/12 2036      Chief Complaint  Patient presents with  . Abdominal Pain    HPI The patient presents to the emergency room with complaints of abdominal pain. Symptoms started acutely this evening after eating. Patient states the pain is primarily in the upper abdomen in the right upper quadrant left upper quadrant. Patient states he's had nausea and vomiting this evening as well. He denies any diarrhea. He denies fever, coughing or chest pain.  He also has a history of an umbilical hernia. Patient states that often times that area is very firm but that has not usually associated with any pain or tenderness.  He has never had any imaging tests for hernias and has never seen a Careers adviser. Past Medical History  Diagnosis Date  . Systolic heart failure     11/09/10 echo with EF 35%, 11/11/10 cath with EF 20%  . Hypertension   . Rheumatic fever     as a child  . History of cocaine abuse     use to smoke crack cocaine, last use was more than 6 years ago    History reviewed. No pertinent past surgical history.  Family History  Problem Relation Age of Onset  . Heart disease Neg Hx     History  Substance Use Topics  . Smoking status: Current Some Day Smoker    Types: Cigarettes  . Smokeless tobacco: Not on file  . Alcohol Use: Yes     Comment: drinks beer mainly on the weekends      Review of Systems  All other systems reviewed and are negative.    Allergies  Penicillins  Home Medications   Current Outpatient Rx  Name  Route  Sig  Dispense  Refill  . CARVEDILOL 12.5 MG PO TABS   Oral   Take 1 tablet (12.5 mg total) by mouth 2 (two) times daily with a meal.   60 tablet   6   . FUROSEMIDE 40 MG PO TABS   Oral   Take 1 tablet (40 mg total) by mouth daily.   30 tablet   6   . LOSARTAN POTASSIUM 50 MG PO TABS   Oral   Take 1 tablet (50 mg total) by mouth daily.  30 tablet   6     BP 122/86  Pulse 59  Temp 97.9 F (36.6 C) (Oral)  Resp 20  SpO2 98%  Physical Exam  Nursing note and vitals reviewed. Constitutional: He appears well-developed and well-nourished. No distress.  HENT:  Head: Normocephalic and atraumatic.  Right Ear: External ear normal.  Left Ear: External ear normal.  Eyes: Conjunctivae normal are normal. Right eye exhibits no discharge. Left eye exhibits no discharge. No scleral icterus.  Neck: Neck supple. No tracheal deviation present.  Cardiovascular: Normal rate, regular rhythm and intact distal pulses.   Pulmonary/Chest: Effort normal and breath sounds normal. No stridor. No respiratory distress. He has no wheezes. He has no rales.  Abdominal: Soft. Bowel sounds are normal. He exhibits mass. He exhibits no distension and no ascites. There is generalized tenderness. There is no rigidity, no rebound and no guarding.       Abdomen soft, mild generalized tenderness, firm approximately baseball size mass in the umbilical region, erythema and tenderness  Musculoskeletal: He exhibits no edema and no tenderness.  Neurological: He is alert. He has normal strength. No  sensory deficit. Cranial nerve deficit:  no gross defecits noted. He exhibits normal muscle tone. He displays no seizure activity. Coordination normal.  Skin: Skin is warm and dry. No rash noted.  Psychiatric: He has a normal mood and affect.    ED Course  Procedures (including critical care time)  Labs Reviewed  COMPREHENSIVE METABOLIC PANEL - Abnormal; Notable for the following:    Glucose, Bld 121 (*)     Alkaline Phosphatase 125 (*)     Total Bilirubin 0.2 (*)     GFR calc non Af Amer 81 (*)     All other components within normal limits  URINALYSIS, ROUTINE W REFLEX MICROSCOPIC - Abnormal; Notable for the following:    APPearance CLOUDY (*)     All other components within normal limits  CBC WITH DIFFERENTIAL - Abnormal; Notable for the following:    WBC  11.6 (*)     RBC 4.19 (*)     MCH 34.4 (*)     Lymphs Abs 4.4 (*)     All other components within normal limits  POCT I-STAT, CHEM 8 - Abnormal; Notable for the following:    Sodium 146 (*)     Potassium 3.4 (*)     Creatinine, Ser 1.40 (*)     Glucose, Bld 119 (*)     All other components within normal limits  ETHANOL - Abnormal; Notable for the following:    Alcohol, Ethyl (B) 166 (*)     All other components within normal limits  LIPASE, BLOOD  URINE RAPID DRUG SCREEN (HOSP PERFORMED)   No results found.   No diagnosis found.    MDM  Patient presents with acute abdominal pain. He has a firm abdominal mass in the area of an umbilical hernia. The exam is not typical for an incarcerated hernia although this certainly is still in the differential. Feels that this could be possibly some type of soft tissue tumor. We'll proceed with laboratory testing and a CT scan for further evaluation        Celene Kras, MD 02/14/12 2351

## 2012-02-14 NOTE — ED Notes (Addendum)
Per ems: large hernia. emeis x 1 pta. Had just eaten when pain started. RUQ LUQ pain. Pt reports hernia that a doctor has ever seen. Reports use of ETOH today.

## 2012-02-15 ENCOUNTER — Emergency Department (HOSPITAL_COMMUNITY): Payer: Self-pay

## 2012-02-15 MED ORDER — HYDROCODONE-ACETAMINOPHEN 5-325 MG PO TABS
1.0000 | ORAL_TABLET | ORAL | Status: DC | PRN
Start: 2012-02-15 — End: 2012-11-22

## 2012-02-15 MED ORDER — IOHEXOL 300 MG/ML  SOLN
100.0000 mL | Freq: Once | INTRAMUSCULAR | Status: AC | PRN
  Administered 2012-02-15: 100 mL via INTRAVENOUS

## 2012-02-15 NOTE — ED Provider Notes (Signed)
Ct + fat containing hernia.  NO bowel.  Pt improved.  Will have follow up oupt surgery,  Ret new/worsening sxs  Mariaclara Spear Lytle Michaels, MD 02/15/12 [phone]

## 2012-02-15 NOTE — ED Notes (Signed)
Pt A.O. X 4. Reports nausea 1/10. Reports pain 3/10. Verbalized understanding of diagnosis. Verbalized understanding of medication admin. Denies N/V/D. Denies SOB. Ambulatory with no assistance.  No further needs at this time.

## 2012-02-15 NOTE — ED Notes (Signed)
Off the floor to CT 

## 2012-05-22 ENCOUNTER — Other Ambulatory Visit: Payer: Self-pay

## 2012-05-22 MED ORDER — FUROSEMIDE 40 MG PO TABS: 40.0000 mg | ORAL_TABLET | Freq: Every day | ORAL | Status: DC

## 2012-06-19 ENCOUNTER — Other Ambulatory Visit: Payer: Self-pay | Admitting: *Deleted

## 2012-06-19 MED ORDER — CARVEDILOL 12.5 MG PO TABS: 12.5000 mg | ORAL_TABLET | Freq: Two times a day (BID) | ORAL | Status: DC

## 2012-06-25 ENCOUNTER — Other Ambulatory Visit (HOSPITAL_COMMUNITY): Payer: Self-pay | Admitting: Cardiology

## 2012-06-25 NOTE — Telephone Encounter (Signed)
Duplicate

## 2012-11-20 DIAGNOSIS — K420 Umbilical hernia with obstruction, without gangrene: Secondary | ICD-10-CM

## 2012-11-20 HISTORY — DX: Umbilical hernia with obstruction, without gangrene: K42.0

## 2012-11-22 ENCOUNTER — Emergency Department (HOSPITAL_COMMUNITY): Payer: BC Managed Care – PPO

## 2012-11-22 ENCOUNTER — Encounter (HOSPITAL_COMMUNITY): Payer: Self-pay

## 2012-11-22 ENCOUNTER — Inpatient Hospital Stay (HOSPITAL_COMMUNITY)
Admission: EM | Admit: 2012-11-22 | Discharge: 2012-11-25 | DRG: 127 | Disposition: A | Discharge status: Home / Self Care | Payer: BC Managed Care – PPO | Attending: Cardiology | Admitting: Cardiology

## 2012-11-22 DIAGNOSIS — I5023 Acute on chronic systolic (congestive) heart failure: Principal | ICD-10-CM | POA: Diagnosis present

## 2012-11-22 DIAGNOSIS — R0603 Acute respiratory distress: Secondary | ICD-10-CM

## 2012-11-22 DIAGNOSIS — I10 Essential (primary) hypertension: Secondary | ICD-10-CM | POA: Diagnosis present

## 2012-11-22 DIAGNOSIS — I509 Heart failure, unspecified: Secondary | ICD-10-CM

## 2012-11-22 DIAGNOSIS — Z720 Tobacco use: Secondary | ICD-10-CM | POA: Diagnosis present

## 2012-11-22 DIAGNOSIS — Z9119 Patient's noncompliance with other medical treatment and regimen: Secondary | ICD-10-CM

## 2012-11-22 DIAGNOSIS — Z91199 Patient's noncompliance with other medical treatment and regimen due to unspecified reason: Secondary | ICD-10-CM

## 2012-11-22 DIAGNOSIS — I059 Rheumatic mitral valve disease, unspecified: Secondary | ICD-10-CM

## 2012-11-22 DIAGNOSIS — E876 Hypokalemia: Secondary | ICD-10-CM | POA: Diagnosis present

## 2012-11-22 DIAGNOSIS — F17200 Nicotine dependence, unspecified, uncomplicated: Secondary | ICD-10-CM | POA: Diagnosis present

## 2012-11-22 LAB — RAPID URINE DRUG SCREEN, HOSP PERFORMED
Amphetamines: NOT DETECTED
Cocaine: NOT DETECTED
Opiates: NOT DETECTED
Tetrahydrocannabinol: NOT DETECTED

## 2012-11-22 LAB — POCT I-STAT, CHEM 8
Chloride: 111 mEq/L (ref 96–112)
Creatinine, Ser: 1.1 mg/dL (ref 0.50–1.35)
Glucose, Bld: 95 mg/dL (ref 70–99)
HCT: 46 % (ref 39.0–52.0)
Hemoglobin: 15.6 g/dL (ref 13.0–17.0)
Potassium: 4.9 mEq/L (ref 3.5–5.1)
Sodium: 142 mEq/L (ref 135–145)

## 2012-11-22 LAB — PRO B NATRIURETIC PEPTIDE: Pro B Natriuretic peptide (BNP): 367.4 pg/mL — ABNORMAL HIGH (ref 0–125)

## 2012-11-22 LAB — TSH: TSH: 0.629 u[IU]/mL (ref 0.350–4.500)

## 2012-11-22 LAB — POCT I-STAT TROPONIN I

## 2012-11-22 LAB — CBC WITH DIFFERENTIAL/PLATELET
Hemoglobin: 15.3 g/dL (ref 13.0–17.0)
Lymphs Abs: 2.6 10*3/uL (ref 0.7–4.0)
Monocytes Relative: 4 % (ref 3–12)
Neutro Abs: 9.5 10*3/uL — ABNORMAL HIGH (ref 1.7–7.7)
Neutrophils Relative %: 73 % (ref 43–77)
Platelets: 276 10*3/uL (ref 150–400)
RBC: 4.35 MIL/uL (ref 4.22–5.81)
WBC: 13 10*3/uL — ABNORMAL HIGH (ref 4.0–10.5)

## 2012-11-22 MED ORDER — ONDANSETRON HCL 4 MG/2ML IJ SOLN: 4.0000 mg | Freq: Four times a day (QID) | INTRAMUSCULAR | Status: DC | PRN

## 2012-11-22 MED ORDER — SODIUM CHLORIDE 0.9 % IJ SOLN: 3.0000 mL | INTRAMUSCULAR | Status: DC | PRN

## 2012-11-22 MED ORDER — CARVEDILOL 12.5 MG PO TABS
12.5000 mg | ORAL_TABLET | Freq: Two times a day (BID) | ORAL | Status: DC
  Administered 2012-11-22 – 2012-11-23 (×2): 12.5 mg via ORAL
  Filled 2012-11-22 (×4): qty 1

## 2012-11-22 MED ORDER — SODIUM CHLORIDE 0.9 % IJ SOLN
3.0000 mL | Freq: Two times a day (BID) | INTRAMUSCULAR | Status: DC
  Administered 2012-11-23 – 2012-11-24 (×4): 3 mL via INTRAVENOUS

## 2012-11-22 MED ORDER — PNEUMOCOCCAL VAC POLYVALENT 25 MCG/0.5ML IJ INJ
0.5000 mL | INJECTION | INTRAMUSCULAR | Status: AC
  Administered 2012-11-23: 0.5 mL via INTRAMUSCULAR
  Filled 2012-11-22: qty 0.5

## 2012-11-22 MED ORDER — FUROSEMIDE 10 MG/ML IJ SOLN
40.0000 mg | Freq: Two times a day (BID) | INTRAMUSCULAR | Status: DC
  Administered 2012-11-23 – 2012-11-24 (×3): 40 mg via INTRAVENOUS
  Filled 2012-11-22 (×6): qty 4

## 2012-11-22 MED ORDER — FUROSEMIDE 10 MG/ML IJ SOLN
40.0000 mg | Freq: Once | INTRAMUSCULAR | Status: AC
  Administered 2012-11-22: 40 mg via INTRAVENOUS
  Filled 2012-11-22: qty 4

## 2012-11-22 MED ORDER — SODIUM CHLORIDE 0.9 % IV SOLN: 250.0000 mL | INTRAVENOUS | Status: DC | PRN

## 2012-11-22 MED ORDER — NITROGLYCERIN IN D5W 200-5 MCG/ML-% IV SOLN
10.0000 ug/min | INTRAVENOUS | Status: DC
  Administered 2012-11-22: 10 ug/min via INTRAVENOUS
  Filled 2012-11-22: qty 1250

## 2012-11-22 MED ORDER — INFLUENZA VAC SPLIT QUAD 0.5 ML IM SUSP
0.5000 mL | INTRAMUSCULAR | Status: AC
  Administered 2012-11-23: 0.5 mL via INTRAMUSCULAR
  Filled 2012-11-22: qty 0.5

## 2012-11-22 MED ORDER — LOSARTAN POTASSIUM 50 MG PO TABS
50.0000 mg | ORAL_TABLET | Freq: Every day | ORAL | Status: DC
  Administered 2012-11-22 – 2012-11-23 (×2): 50 mg via ORAL
  Filled 2012-11-22 (×3): qty 1

## 2012-11-22 MED ORDER — FUROSEMIDE 10 MG/ML IJ SOLN
40.0000 mg | INTRAMUSCULAR | Status: AC
  Administered 2012-11-22: 40 mg via INTRAVENOUS
  Filled 2012-11-22: qty 4

## 2012-11-22 MED ORDER — ENOXAPARIN SODIUM 40 MG/0.4ML ~~LOC~~ SOLN
40.0000 mg | SUBCUTANEOUS | Status: DC
  Administered 2012-11-22 – 2012-11-24 (×3): 40 mg via SUBCUTANEOUS
  Filled 2012-11-22 (×4): qty 0.4

## 2012-11-22 MED ORDER — ACETAMINOPHEN 325 MG PO TABS
650.0000 mg | ORAL_TABLET | ORAL | Status: DC | PRN
  Administered 2012-11-22 – 2012-11-24 (×3): 650 mg via ORAL
  Filled 2012-11-22 (×4): qty 2

## 2012-11-22 MED ORDER — NITROGLYCERIN IN D5W 200-5 MCG/ML-% IV SOLN: 2.0000 ug/min | INTRAVENOUS | Status: DC

## 2012-11-22 NOTE — ED Provider Notes (Signed)
CSN: [id number]     Arrival date & time 11/22/12  9528 History   First MD Initiated Contact with Patient 11/22/12 [phone]     Chief Complaint  Patient presents with  . Shortness of Breath   (Consider location/radiation/quality/duration/timing/severity/associated sxs/prior Treatment) HPI Comments: 57 year old male with a history of hypertension and congestive heart failure. He does have a history of crack cocaine use but this was many years ago. His last echocardiogram from 2012 showed an ejection fraction of approximately 45-50%. It had been as low as 20% in September of 2012. He states that his shortness of breath is progressive, severe, he was unable to lay down last night but slept in a recliner. He denies any swelling of his lower extremities. The patient is in respiratory distress, he speaks in shortened sentences, approximately 2-3 words at a time, level V caveat apply secondary to the severity of the patient's illness.  Patient is a 57 y.o. male presenting with shortness of breath. The history is provided by the patient and medical records.  Shortness of Breath   Past Medical History  Diagnosis Date  . Systolic heart failure     11/09/10 echo with EF 35%, 11/11/10 cath with EF 20%  . Hypertension   . Rheumatic fever     as a child  . History of cocaine abuse     use to smoke crack cocaine, last use was more than 6 years ago   No past surgical history on file. Family History  Problem Relation Age of Onset  . Heart disease Neg Hx    History  Substance Use Topics  . Smoking status: Current Some Day Smoker    Types: Cigarettes  . Smokeless tobacco: Not on file  . Alcohol Use: Yes     Comment: drinks beer mainly on the weekends    Review of Systems  Unable to perform ROS: Severe respiratory distress  Respiratory: Positive for shortness of breath.     Allergies  Penicillins  Home Medications   Current Outpatient Rx  Name  Route  Sig  Dispense  Refill  . carvedilol  (COREG) 12.5 MG tablet   Oral   Take 1 tablet (12.5 mg total) by mouth 2 (two) times daily with a meal.   60 tablet   1     PATIENT NEEDS  AN APPOINTMENT TO SE CARDIOLOGIST A ...   . furosemide (LASIX) 40 MG tablet   Oral   Take 1 tablet (40 mg total) by mouth daily.   30 tablet   1     NEEDS FOLLOW UP APPOINTMENT FOR FURTHER REFILLS   . HYDROcodone-acetaminophen (NORCO/VICODIN) 5-325 MG per tablet   Oral   Take 1 tablet by mouth every 4 (four) hours as needed for pain.   12 tablet   0   . EXPIRED: losartan (COZAAR) 50 MG tablet   Oral   Take 1 tablet (50 mg total) by mouth daily.   30 tablet   6    BP 168/116  Pulse 108  Temp(Src) 97.8 F (36.6 C) (Oral)  Resp 27  Ht 6' (1.829 m)  Wt 220 lb (99.791 kg)  BMI 29.83 kg/m2  SpO2 93% Physical Exam  Nursing note and vitals reviewed. Constitutional: He appears well-developed and well-nourished. He appears distressed.  HENT:  Head: Normocephalic and atraumatic.  Mouth/Throat: Oropharynx is clear and moist. No oropharyngeal exudate.  Eyes: Conjunctivae and EOM are normal. Pupils are equal, round, and reactive to light. Right eye exhibits  no discharge. Left eye exhibits no discharge. No scleral icterus.  Neck: Normal range of motion. Neck supple. No JVD present. No thyromegaly present.  Cardiovascular: Regular rhythm and intact distal pulses.  Exam reveals no gallop and no friction rub.   No murmur heard. Distant heart sounds, tachycardic  Pulmonary/Chest: He is in respiratory distress. He has no wheezes. He has rales.  Diffuse pulmonary rales, severe distress, accessory muscle use  Abdominal: Soft. Bowel sounds are normal. He exhibits no distension and no mass. There is no tenderness.  Musculoskeletal: Normal range of motion. He exhibits no edema and no tenderness.  Lymphadenopathy:    He has no cervical adenopathy.  Neurological: He is alert. Coordination normal.  Skin: Skin is warm. No rash noted. He is diaphoretic.  No erythema.  Psychiatric: He has a normal mood and affect. His behavior is normal.    ED Course  Procedures (including critical care time) Labs Review Labs Reviewed  PRO B NATRIURETIC PEPTIDE - Abnormal; Notable for the following:    Pro B Natriuretic peptide (BNP) 367.4 (*)    All other components within normal limits  CBC WITH DIFFERENTIAL - Abnormal; Notable for the following:    WBC 13.0 (*)    MCH 35.2 (*)    Neutro Abs 9.5 (*)    All other components within normal limits  URINE RAPID DRUG SCREEN (HOSP PERFORMED)  POCT I-STAT, CHEM 8  POCT I-STAT TROPONIN I   Imaging Review Dg Chest Port 1 View  11/22/2012   CLINICAL DATA:  Short of breath  EXAM: PORTABLE CHEST - 1 VIEW  COMPARISON:  None.  FINDINGS: Severe diffuse bilateral airspace disease is relatively symmetric. The heart is enlarged. No significant effusion.  IMPRESSION: Severe diffuse bilateral airspace disease most consistent with pulmonary edema.   Electronically Signed   By: Marlan Palau M.D.   On: 11/22/2012 09:35    MDM   1. Acute respiratory distress   2. Severe hypertension   3. Acute congestive heart failure    The patient is ill-appearing with a what appears to be pulmonary edema and he does admit to having some clear phlegm though this could just be pulmonary edema and frothy sputum. EKG shows a sinus tachycardia with no signs of acute ischemia. The patient does appear acutely ill and will require high level of care including BiPAP, anticipate advanced level admission either step down or intensive.. Labs, chest x-ray pending, Lasix, nitro drip.  ED ECG REPORT  I personally interpreted this EKG   Date: 11/22/2012   Rate: 114  Rhythm: sinus tachycardia  QRS Axis: normal  Intervals: normal  ST/T Wave abnormalities: normal  Conduction Disutrbances:nonspecific intraventricular conduction delay  Narrative Interpretation: LVH criteria by voltage  Old EKG Reviewed: Compared with 11/09/2010, QRS is no  prolonged, left ventricular hypertrophy still present   0946 AM - The patient's blood pressure is severely hypertensive, 195/127 at this time. He is receiving nitroglycerin drip, BiPAP and has had some improvement in his work of breathing. He is still diaphoretic. He is now able to tell me through hand gestures but not speaking because of the shortness of breath that he has not taken his medications in 3-4 months. He denies cocaine use. His cardiologist is Dr. Gala Romney  The patient's chest x-ray PA and lateral views. Hyperflexing and interpreted these x-rays of the severe pulmonary edema which is bilateral.  Nitroglycerin drip and BiPAP have made the symptoms much more tolerable for the patient. I discussed his care  with the cardiology team who will come to admit him to the hospital. Critical care has been delivered.  CRITICAL CARE Performed by: Vida Roller Total critical care time: 35 Critical care time was exclusive of separately billable procedures and treating other patients. Critical care was necessary to treat or prevent imminent or life-threatening deterioration. Critical care was time spent personally by me on the following activities: development of treatment plan with patient and/or surrogate as well as nursing, discussions with consultants, evaluation of patient's response to treatment, examination of patient, obtaining history from patient or surrogate, ordering and performing treatments and interventions, ordering and review of laboratory studies, ordering and review of radiographic studies, pulse oximetry and re-evaluation of patient's condition.   Vida Roller, MD 11/22/12 [phone]

## 2012-11-22 NOTE — ED Notes (Signed)
O2 sats on room air in triage 81 % pt placed on 2 liters sats up to 84 , O2 increased to 4 liters sats up to 86%, pt then placed on NRB due to sats and resp rate of 40

## 2012-11-22 NOTE — ED Notes (Signed)
Pt sob, hx of CHF but no meds in months.  HX of hypertension.  Pt has had to sleep sitting up.

## 2012-11-22 NOTE — ED Notes (Signed)
MD at bedside. 

## 2012-11-22 NOTE — Progress Notes (Signed)
  Echocardiogram 2D Echocardiogram has been performed.  Shawn Perkins FRANCES 11/22/2012, 4:43 PM

## 2012-11-22 NOTE — ED Notes (Signed)
Pt from home co sob, has not had meds in over 3 months.  Pt hx of chf, copd, hypertension.  Pt unable to sit and feels as if he can breath better while standing and leaning on bed.  Pt currently on bipap and much more comfortable.

## 2012-11-22 NOTE — H&P (Signed)
Advanced Heart Failure Team Consult Note  Referring Physician: Dr. Hyacinth Meeker, ED Doc Primary Physician: Primary Cardiologist:  Dr. Gala Romney  Reason for Consultation: SOB   HPI:    Shawn Perkins is a 57 year old African American male with a h/o severe HTN, tobacco and ETOH use, chronic systolic HF (EF 20% 10/2010 which improved to 55-60% 01/2011). He was lost to follow up in the HF clinic and was last seen in 07/2011.  He reports that he has been out of his medications for at least 3 months, but was doing pretty well until about 2 weeks ago when he started having some SOB. Weight at home has been stable 220-228 lbs. Has not had a functional decline until past 2 weeks and reports until being able to walk up and down stairs with no issues. Last night he had severe orthopnea and had to sleep in a recliner, which did not relieve his SOB and that is what brought him in today. When he presented to the ED he was hypertensive 190-200/90-100s and struggling to breath. He was placed on IV nitro and BiPAP. His blood pressure is now 132/108 and O2 88-92% on 5L O2.  He still smokes about 4 cigarettes a day and drinks a 6 pack on Saturday and Sunday. He denies any CP. On admission pertinent labs were pro-BNP 367, K+ 4.9, Cr 1.1, WBC 13 and urine drug screen negative. He received 40 mg IV lasix and had about 400-600 cc out.   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   Review of Systems: [y] = yes, [ ]  = no   General: Weight gain [ ] ; Weight loss [ ] ; Anorexia [ ] ; Fatigue [ ] ; Fever [ ] ; Chills [ ] ; Weakness [ ]   Cardiac: Chest pain/pressure [ ] ; Resting SOB [Y ]; Exertional SOB [Y ]; Orthopnea [Y ]; Pedal Edema [ ] ; Palpitations [ N]; Syncope [ ] ; Presyncope [ ] ; Paroxysmal nocturnal dyspnea[ ]   Pulmonary: Cough [ ] ; Wheezing[ ] ; Hemoptysis[ ] ; Sputum [ ] ; Snoring [ ]   GI: Vomiting[ ] ; Dysphagia[ ] ; Melena[ ] ; Hematochezia [ ] ; Heartburn[ ] ; Abdominal pain [ ] ; Constipation [  ]; Diarrhea [ ] ; BRBPR [ ]   GU: Hematuria[ ] ; Dysuria [ ] ; Nocturia[ ]   Vascular: Pain in legs with walking [ ] ; Pain in feet with lying flat [ ] ; Non-healing sores [ ] ; Stroke [ ] ; TIA [ ] ; Slurred speech [ ] ;  Neuro: Headaches[ ] ; Vertigo[ ] ; Seizures[ ] ; Paresthesias[ ] ;Blurred vision [ ] ; Diplopia [ ] ; Vision changes [ ]   Ortho/Skin: Arthritis [ ] ; Joint pain [ ] ; Muscle pain [ ] ; Joint swelling [ ] ; Back Pain [ ] ; Rash [ ]   Psych: Depression[ ] ; Anxiety[ ]   Heme: Bleeding problems [ ] ; Clotting disorders [ ] ; Anemia [ ]   Endocrine: Diabetes [ ] ; Thyroid dysfunction[ ]   Home Medications Prior to Admission medications   Medication Sig Start Date End Date Taking? Authorizing Provider  Aspirin-Salicylamide-Caffeine (BC HEADACHE POWDER PO) Take 1 packet by mouth daily as needed (pain).   Yes Historical Provider, MD  Pseudoephedrine-APAP-DM (DAYQUIL PO) Take 30 mLs by mouth daily as needed (cold).   Yes Historical Provider, MD  carvedilol (COREG) 12.5 MG tablet Take 1 tablet (12.5 mg total) by mouth 2 (two) times daily with a meal. 06/19/12   Dolores Patty, MD  furosemide (LASIX) 40 MG tablet Take 1 tablet (40 mg total) by mouth daily. 05/22/12   Dolores Patty, MD  losartan (COZAAR) 50 MG tablet Take 1 tablet (50 mg total) by mouth daily. 10/05/11 10/04/12  Dolores Patty, MD    Past Medical History: Past Medical History  Diagnosis Date  . Systolic heart failure     11/09/10 echo with EF 35%, 11/11/10 cath with EF 20%  . Hypertension   . Rheumatic fever     as a child  . History of cocaine abuse     use to smoke crack cocaine, last use was more than 6 years ago    Past Surgical History: No past surgical history on file.  Family History: Family History  Problem Relation Age of Onset  . Heart disease Neg Hx     Social History: History   Social History  . Marital Status: Single    Spouse Name: N/A    Number of Children: N/A  . Years of Education: N/A   Social  History Main Topics  . Smoking status: Current Some Day Smoker    Types: Cigarettes  . Smokeless tobacco: Not on file  . Alcohol Use: Yes     Comment: drinks beer mainly on the weekends  . Drug Use: Yes     Comment: use to smoke crack cocaine over 6 years ago  . Sexual Activity: Not on file   Other Topics Concern  . Not on file   Social History Narrative  . No narrative on file    Allergies:  Allergies  Allergen Reactions  . Penicillins Other (See Comments)    Childhood reaction    Objective:    Vital Signs:   Temp:  [97.8 F (36.6 C)] 97.8 F (36.6 C) (10/03 0946) Pulse Rate:  [96-115] 96 (10/03 1136) Resp:  [21-30] 23 (10/03 1045) BP: (136-195)/(102-130) 136/102 mmHg (10/03 1136) SpO2:  [83 %-100 %] 100 % (10/03 1136) Weight:  [220 lb (99.791 kg)] 220 lb (99.791 kg) (10/03 0946)    Weight change: Filed Weights   11/22/12 0946  Weight: 220 lb (99.791 kg)    Intake/Output:   Intake/Output Summary (Last 24 hours) at 11/22/12 1204 Last data filed at 11/22/12 1106  Gross per 24 hour  Intake      0 ml  Output    700 ml  Net   -700 ml     Physical Exam: General:  Well appearing. No resp difficulty, on 5L HEENT: normal Neck: supple. JVP 8-9 cm. Carotids 2+ bilat; no bruits. No lymphadenopathy or thryomegaly appreciated. Cor: PMI nondisplaced. Regular rate & rhythm. No rubs, gallops or murmurs. Lungs: Crackles at bases bilaterally. Abdomen: soft, nontender, nondistended. No hepatosplenomegaly. No bruits or masses. Good bowel sounds. Extremities: no cyanosis, clubbing, rash, edema Neuro: alert & orientedx3, cranial nerves grossly intact. moves all 4 extremities w/o difficulty. Affect pleasant  Telemetry: SR-ST 90-106  Labs: Basic Metabolic Panel:  Recent Labs Lab 11/22/12 0930  NA 142  K 4.9  CL 111  GLUCOSE 95  BUN 9  CREATININE 1.10    Liver Function Tests: No results found for this basename: AST, ALT, ALKPHOS, BILITOT, PROT, ALBUMIN,  in the  last 168 hours No results found for this basename: LIPASE, AMYLASE,  in the last 168 hours No results found for this basename: AMMONIA,  in the last 168 hours  CBC:  Recent Labs Lab 11/22/12 0921 11/22/12 0930  WBC 13.0*  --   NEUTROABS 9.5*  --   HGB 15.3 15.6  HCT 42.7 46.0  MCV 98.2  --   PLT 276  --  Cardiac Enzymes: No results found for this basename: CKTOTAL, CKMB, CKMBINDEX, TROPONINI,  in the last 168 hours  BNP: BNP (last 3 results)  Recent Labs  11/22/12 0921  PROBNP 367.4*    CBG: No results found for this basename: GLUCAP,  in the last 168 hours  Coagulation Studies: No results found for this basename: LABPROT, INR,  in the last 72 hours  Other results: ST 114 bpm  Imaging: Dg Chest Port 1 View  11/22/2012   CLINICAL DATA:  Short of breath  EXAM: PORTABLE CHEST - 1 VIEW  COMPARISON:  None.  FINDINGS: Severe diffuse bilateral airspace disease is relatively symmetric. The heart is enlarged. No significant effusion.  IMPRESSION: Severe diffuse bilateral airspace disease most consistent with pulmonary edema.   Electronically Signed   By: Marlan Palau M.D.   On: 11/22/2012 09:35      Medications:     Current Medications:     Infusions: . nitroGLYCERIN 10 mcg/min (11/22/12 0944)      Assessment:   1) Chronic HF, EF imroved 55-60% (2012) with exacerbation (prior nonischemic CMP probably related to uncontrolled HTN).  2) Hypertension crisis 3) ETOH 4) Smoking  Plan/Discussion:    Mr. Delsol is a pleasant gentleman who until a few weeks ago was doing well and had no complaints of SOB. Over the past 2 weeks has progressively gotten worse and sought medical attention today d/t his increased SOB and DOE. He has a history of HF EF 20%, but it improved to 55-60%. He has been out of his medications for a couple of months and presented with a hypertensive crisis causing flash pulmonary edema and requiring BiPAP.   He has received 40 mg IV lasix  in the ED with good UOP. Will give another dose tonight. His O2 is still marginal on 5L on 88-92% will admit to step down just in case he requires BiPap again. Will continue nitroglycerin and start back his anti-hypertensives. With recent decline will repeat ECHO.   Continue to monitor renal function closely and WBC.   Length of Stay: 0  Aundria Rud 11/22/2012, 12:04 PM  Advanced Heart Failure Team Pager [phone] (M-F; 7a - 4p)  Please contact [facility] for night-coverage after hours (4p -7a ) and weekends on [email or web address]  Patient seen with NP, agree with the above note.  1. Acute CHF: Pulmonary edema in setting of hypertensive emergency.  He has been out of his antihypertensives.  No chest pain, doubt ACS.  TnI negative.  - NTG gtt for BP control, would aim to get < 150/100 today.  - Lasix 40 mg IV bid - Restart losartan and Coreg.  - Echocardiogram. 2. HTN: As above, gradual control needed.  NTG gtt for now, transition back to home medications (losartan and Coreg).  3. Smoking: Needs to quit.   Marca Ancona 11/22/2012 1:38 PM

## 2012-11-22 NOTE — ED Notes (Addendum)
Gave report to Sarah 2h

## 2012-11-23 DIAGNOSIS — I10 Essential (primary) hypertension: Secondary | ICD-10-CM

## 2012-11-23 DIAGNOSIS — F17200 Nicotine dependence, unspecified, uncomplicated: Secondary | ICD-10-CM

## 2012-11-23 DIAGNOSIS — I5023 Acute on chronic systolic (congestive) heart failure: Secondary | ICD-10-CM | POA: Diagnosis present

## 2012-11-23 LAB — BASIC METABOLIC PANEL
BUN: 14 mg/dL (ref 6–23)
CO2: 23 mEq/L (ref 19–32)
Calcium: 8.9 mg/dL (ref 8.4–10.5)
Creatinine, Ser: 1.07 mg/dL (ref 0.50–1.35)
GFR calc non Af Amer: 75 mL/min — ABNORMAL LOW (ref >90)
Glucose, Bld: 87 mg/dL (ref 70–99)
Sodium: 138 mEq/L (ref 135–145)

## 2012-11-23 MED ORDER — POTASSIUM CHLORIDE CRYS ER 20 MEQ PO TBCR
20.0000 meq | EXTENDED_RELEASE_TABLET | Freq: Two times a day (BID) | ORAL | Status: DC
  Administered 2012-11-23 – 2012-11-25 (×5): 20 meq via ORAL
  Filled 2012-11-23 (×7): qty 1

## 2012-11-23 MED ORDER — CARVEDILOL 25 MG PO TABS
25.0000 mg | ORAL_TABLET | Freq: Two times a day (BID) | ORAL | Status: DC
  Administered 2012-11-23 – 2012-11-24 (×3): 25 mg via ORAL
  Filled 2012-11-23 (×6): qty 1

## 2012-11-23 NOTE — Progress Notes (Signed)
TELEMETRY: Reviewed telemetry pt in NSR: Filed Vitals:   11/23/12 0500 11/23/12 0600 11/23/12 0700 11/23/12 0752  BP: 115/69 130/88 131/102 125/87  Pulse: 87 72 71 81  Temp:    97.4 F (36.3 C)  TempSrc:    Oral  Resp: 16 12 13 15   Height:      Weight: 217 lb 6 oz (98.6 kg)     SpO2: 98% 94% 96% 93%    Intake/Output Summary (Last 24 hours) at 11/23/12 0854 Last data filed at 11/23/12 0700  Gross per 24 hour  Intake  469.5 ml  Output   2580 ml  Net -2110.5 ml    SUBJECTIVE Feels much better today. Less SOB. Breathing much improved. No chest pain.  LABS: Basic Metabolic Panel:  Recent Labs  16/10/96 0930 11/23/12 0510  NA 142 138  K 4.9 3.3*  CL 111 103  CO2  --  23  GLUCOSE 95 87  BUN 9 14  CREATININE 1.10 1.07  CALCIUM  --  8.9   CBC:  Recent Labs  11/22/12 0921 11/22/12 0930  WBC 13.0*  --   NEUTROABS 9.5*  --   HGB 15.3 15.6  HCT 42.7 46.0  MCV 98.2  --   PLT 276  --    Cardiac Enzymes: No results found for this basename: CKTOTAL, CKMB, CKMBINDEX, TROPONINI,  in the last 72 hours BNP: 367.4  D-Dimer: No results found for this basename: DDIMER,  in the last 72 hours Hemoglobin A1C: No results found for this basename: HGBA1C,  in the last 72 hours Fasting Lipid Panel: No results found for this basename: CHOL, HDL, LDLCALC, TRIG, CHOLHDL, LDLDIRECT,  in the last 72 hours Thyroid Function Tests:  Recent Labs  11/22/12 1458  TSH 0.629    Radiology/Studies:  Dg Chest Port 1 View  11/22/2012   CLINICAL DATA:  Short of breath  EXAM: PORTABLE CHEST - 1 VIEW  COMPARISON:  None.  FINDINGS: Severe diffuse bilateral airspace disease is relatively symmetric. The heart is enlarged. No significant effusion.  IMPRESSION: Severe diffuse bilateral airspace disease most consistent with pulmonary edema.   Electronically Signed   By: Marlan Palau M.D.   On: 11/22/2012 09:35   Ecg: 11/22/12 sinus tachycardia. LVH.  Echo:Study Conclusions  - Left  ventricle: The cavity size was severely dilated. Wall thickness was increased in a pattern of mild LVH. Systolic function was moderately reduced. The estimated ejection fraction was in the range of 35% to 40%. Diffuse hypokinesis. There was fusion of early and atrial contributions to ventricular filling. - Mitral valve: Mild regurgitation. - Left atrium: The atrium was mildly dilated.   PHYSICAL EXAM General: Well developed, well nourished, in no acute distress. Head: Normal Neck: Negative for carotid bruits. JVD 8 cm. Lungs: Bibasilar rales. Breathing is unlabored. Heart: RRR S1 S2 without murmurs, rubs, or gallops.  Abdomen: Soft, non-tender, non-distended with normoactive bowel sounds. No hepatomegaly.  No obvious abdominal masses. Msk:  Strength and tone appears normal for age. Extremities: No clubbing, cyanosis or edema.  Distal pedal pulses are 2+ and equal bilaterally. Neuro: Alert and oriented X 3. Moves all extremities spontaneously. Psych:  Responds to questions appropriately with a normal affect.  ASSESSMENT AND PLAN:** 1. Acute on chronic systolic CHF. Good response to diuresis with I/O negative 2.5 liters and weight down 3 pounds. EF on initial dx was 20% then improved to 55-60% with treatment. Now 35-40%. Stressed importance of compliance with medical regimen  and low sodium diet. Will DC IV nitrates. Continue IV lasix. Replete potassium. Titrate meds as tolerated. 2. HTN control improved.  3. Hypokalemia- replete. 4. Tobacco abuse- counseled on smoking cessation.  Principal Problem:   Acute on chronic systolic CHF (congestive heart failure) Active Problems:   Tobacco abuse   HTN (hypertension), malignant    Signed, Joylene Wescott [place] MD,FACC 11/23/2012 8:54 AM

## 2012-11-24 LAB — BASIC METABOLIC PANEL
Calcium: 9.1 mg/dL (ref 8.4–10.5)
Chloride: 102 mEq/L (ref 96–112)
GFR calc Af Amer: 87 mL/min — ABNORMAL LOW (ref >90)
GFR calc non Af Amer: 75 mL/min — ABNORMAL LOW (ref >90)
Glucose, Bld: 101 mg/dL — ABNORMAL HIGH (ref 70–99)
Potassium: 3.6 mEq/L (ref 3.5–5.1)
Sodium: 137 mEq/L (ref 135–145)

## 2012-11-24 MED ORDER — LOSARTAN POTASSIUM 50 MG PO TABS
100.0000 mg | ORAL_TABLET | Freq: Every day | ORAL | Status: DC
  Administered 2012-11-24: 100 mg via ORAL
  Filled 2012-11-24 (×2): qty 2

## 2012-11-24 MED ORDER — FUROSEMIDE 40 MG PO TABS
40.0000 mg | ORAL_TABLET | Freq: Two times a day (BID) | ORAL | Status: DC
  Administered 2012-11-24 – 2012-11-25 (×3): 40 mg via ORAL
  Filled 2012-11-24 (×5): qty 1

## 2012-11-24 MED ORDER — SPIRONOLACTONE 25 MG PO TABS
25.0000 mg | ORAL_TABLET | Freq: Every day | ORAL | Status: DC
  Administered 2012-11-24 – 2012-11-25 (×2): 25 mg via ORAL
  Filled 2012-11-24 (×2): qty 1

## 2012-11-24 NOTE — Progress Notes (Signed)
TELEMETRY: Reviewed telemetry pt in NSR: Filed Vitals:   11/24/12 0500 11/24/12 0608 11/24/12 0700 11/24/12 0753  BP: 146/111 131/88 132/93   Pulse: 96 81 73   Temp:    97.9 F (36.6 C)  TempSrc:    Oral  Resp: 14 20 13    Height:      Weight:      SpO2: 100% 93% 95%     Intake/Output Summary (Last 24 hours) at 11/24/12 0826 Last data filed at 11/24/12 0700  Gross per 24 hour  Intake 1128.75 ml  Output   2650 ml  Net -1521.25 ml    SUBJECTIVE Feels well today.  Breathing much improved. No chest pain.  LABS: Basic Metabolic Panel:  Recent Labs  21/30/86 0510 11/24/12 0600  NA 138 137  K 3.3* 3.6  CL 103 102  CO2 23 23  GLUCOSE 87 101*  BUN 14 16  CREATININE 1.07 1.07  CALCIUM 8.9 9.1   CBC:  Recent Labs  11/22/12 0921 11/22/12 0930  WBC 13.0*  --   NEUTROABS 9.5*  --   HGB 15.3 15.6  HCT 42.7 46.0  MCV 98.2  --   PLT 276  --    BNP: 367.4  Thyroid Function Tests:  Recent Labs  11/22/12 1458  TSH 0.629    Radiology/Studies:  Dg Chest Port 1 View  11/22/2012   CLINICAL DATA:  Short of breath  EXAM: PORTABLE CHEST - 1 VIEW  COMPARISON:  None.  FINDINGS: Severe diffuse bilateral airspace disease is relatively symmetric. The heart is enlarged. No significant effusion.  IMPRESSION: Severe diffuse bilateral airspace disease most consistent with pulmonary edema.   Electronically Signed   By: Marlan Palau M.D.   On: 11/22/2012 09:35   Ecg: 11/22/12 sinus tachycardia. LVH.  Echo:Study Conclusions  - Left ventricle: The cavity size was severely dilated. Wall thickness was increased in a pattern of mild LVH. Systolic function was moderately reduced. The estimated ejection fraction was in the range of 35% to 40%. Diffuse hypokinesis. There was fusion of early and atrial contributions to ventricular filling. - Mitral valve: Mild regurgitation. - Left atrium: The atrium was mildly dilated.   PHYSICAL EXAM General: Well developed, well  nourished, in no acute distress. Head: Normal Neck: Negative for carotid bruits. JVD 6 cm. Lungs: Bibasilar rales. Breathing is unlabored. Heart: RRR S1 S2 without murmurs, rubs, or gallops.  Abdomen: Soft, non-tender, non-distended with normoactive bowel sounds. No hepatomegaly.  No obvious abdominal masses. Msk:  Strength and tone appears normal for age. Extremities: No clubbing, cyanosis or edema.  Distal pedal pulses are 2+ and equal bilaterally. Neuro: Alert and oriented X 3. Moves all extremities spontaneously. Psych:  Responds to questions appropriately with a normal affect.  ASSESSMENT AND PLAN: 1. Acute on chronic systolic CHF. Good response to diuresis with I/O negative 786 cc and weight down 5 pounds since admission. EF on initial dx was 20% then improved to 55-60% with treatment. Now 35-40%. Stressed importance of compliance with medical regimen and low sodium diet. Will switch lasix to po. Add aldactone 25 mg daily. Increase cozaar.  Transfer to telemetry and ambulate. Patient insists that he must be DC tomorrow. 2. HTN control improved.  3. Hypokalemia- repleted. 4. Tobacco abuse- counseled on smoking cessation.  Principal Problem:   Acute on chronic systolic CHF (congestive heart failure) Active Problems:   Tobacco abuse   HTN (hypertension), malignant    Signed, Peter [place] MD,FACC 11/24/2012 8:26  AM    

## 2012-11-24 NOTE — Progress Notes (Addendum)
Increased BP noted.  Patient is sleeping.  Awakened, no C/O Chest pain, Chest tightness or SOB.  Crackles noted   posteriorly 1/4 way L lung, decreased breath sounds R lung .  Weight 97.7 kg.  Dr. Champ Mungo notified of all parameters.  Will give AM dose of Lasix 40 mg IV now.  Continue to monitor.

## 2012-11-24 NOTE — Progress Notes (Signed)
   CARE MANAGEMENT NOTE 11/24/2012  Patient:  Shawn Perkins, Shawn Perkins   Account Number:  [id number]  Date Initiated:  11/24/2012  Documentation initiated by:  [facility]  Subjective/Objective Assessment:   Chronic HF, EF imroved 55-60% (2012) with exacerbation (prior nonischemic CMP probably related to uncontrolled HTN).     Action/Plan:   lives home alone   Anticipated DC Date:  11/25/2012   Anticipated DC Plan:  HOME W HOME HEALTH SERVICES      DC Planning Services  CM consult      Choice offered to / List presented to:             Status of service:  Completed, signed off Medicare Important Message given?   (If response is "NO", the following Medicare IM given date fields will be blank) Date Medicare IM given:   Date Additional Medicare IM given:    Discharge Disposition:  HOME/SELF CARE  Per UR Regulation:    If discussed at Long Length of Stay Meetings, dates discussed:    Comments:  11/24/2012 1030 NCM spoke to pt and states he works two jobs. He is able to pay for medications and has a scale at home. States scale is not working properly but is fully aware of the importance of weighing daily. States he will fluctuate in his weight from day to day. He will notice when he gains 3 to 5 lbs. NCM explained the importance of follow up with physician, taking medications as prescribed and heart healthy diet minimizing his sodium intake. States he eats out at mainly fast food places. Educated him to review sodium intake of his foods and that restaurants will provide a nutrition sheet to him prior to ordering. He states he does try to exercise and walk daily but lately has experienced an increase in stress with family issues. Pt states he is motivated to take better care of himself at home. States his meds usually run $20.00 per month. Unit RN will completed CHF teaching and have pt review videos. Isidoro Donning RN CCM Case Mgmt phone [phone]

## 2012-11-24 NOTE — Discharge Summary (Signed)
Advanced Heart Failure Team  Discharge Summary   Patient ID: Shawn Perkins MRN: [id number], DOB/AGE: Jul 16, 1955 57 y.o. Admit date: 11/22/2012 D/C date:     11/25/2012   Primary Discharge Diagnoses:  1. Acute/Chronic Systolic Heart Failure. Previous EF was 55-60% but now back down 35-40% likey due medication noncompliance. He had beenout of his medications for couple of months.  2.  Repsiratory Distress- required BiPaP in the ED with oxygen saturations   HTN, [place]- Admit BP 191/127.  Restarted on losartan and coreg.   3. Smoking: Needs to quit.   Hospital Course:  Shawn Perkins is a 56 year old African American male with a h/o severe HTN, tobacco and ETOH use, chronic systolic HF (EF 20% 10/2010 which improved to 55-60% 01/2011 now back down to 35-40%). Prior to admit he been out of his medications for a couple of months.   He presented to [facility] ED with increased DOE, dyspnea at rest, and severely elevated BP 191/127. He was  placed on BiPAP, Nitroglycerin drip, 40 mg of IV lasix.    CXR was consistent with pulmonary edema. Oxygen saturations improved and he was transitioned to 5 liters [place] with oxygen saturations 88%. As he improved oxygen was weaned off. Repeat ECHO was performed with EF down to 35-40% from previous 55-60%. He was restarted on losartan and carvedilol. As his BP improved,  Nitroglycerin drip was weaned off.  He diuresed well with IV lasix and he was transitioned to lasix 40 mg twice a day and spironolactone 25 mg daily. Overall he diuresed  6 pounds.   He received extensive education of daily weights, medication compliance, and low salt food choices. He will continue to be followed closely in the HF clinic with follow up scheduled for December 02, 2012 at 10:40. Will need BMET at follow up appointment.    Discharge Weight Range: 214 pounds.  Discharge Vitals: Blood pressure 105/60, pulse 81, temperature 97.9 F (36.6 C), temperature source Oral, resp. rate 18, height 5\' 11"  (1.803  m), weight 214 lb 14.4 oz (97.478 kg), SpO2 96.00%.  Labs: Lab Results  Component Value Date   WBC 13.0* 11/22/2012   HGB 15.6 11/22/2012   HCT 46.0 11/22/2012   MCV 98.2 11/22/2012   PLT 276 11/22/2012     Recent Labs Lab 11/25/12 0510  NA 136  K 3.8  CL 100  CO2 22  BUN 21  CREATININE 1.27  CALCIUM 9.1  GLUCOSE 96   No results found for this basename: CHOL,  HDL,  LDLCALC,  TRIG   BNP (last 3 results)  Recent Labs  11/22/12 0921 11/25/12 0510  PROBNP 367.4* 63.0    Diagnostic Studies/Procedures   No results found.  Discharge Medications     Medication List    STOP taking these medications       DAYQUIL PO      TAKE these medications       BC HEADACHE POWDER PO  Take 1 packet by mouth daily as needed (pain).     carvedilol 25 MG tablet  Commonly known as:  COREG  Take 1 tablet (25 mg total) by mouth 2 (two) times daily with a meal.     furosemide 40 MG tablet  Commonly known as:  LASIX  Take 1 tablet (40 mg total) by mouth 2 (two) times daily.     losartan 100 MG tablet  Commonly known as:  COZAAR  Take 1 tablet (100 mg total) by mouth daily.  potassium chloride SA 20 MEQ tablet  Commonly known as:  K-DUR,KLOR-CON  Take 1 tablet (20 mEq total) by mouth 2 (two) times daily.     spironolactone 25 MG tablet  Commonly known as:  ALDACTONE  Take 1 tablet (25 mg total) by mouth daily.        Disposition   The patient will be discharged in stable condition to home. Discharge Orders   Future Orders Complete By Expires   ACE Inhibitor / ARB already ordered  As directed    Diet - low sodium heart healthy  As directed    Heart Failure patients record your daily weight using the same scale at the same time of day  As directed    Increase activity slowly  As directed      Follow-up Information   Follow up with Arvilla Meres, MD On 12/02/2012. (at 10:40 Garage Code [id number])    Specialty:  Cardiology   Contact information:   [address] [place] [address] [phone]         Duration of Discharge Encounter: Greater than 35 minutes   Signed, Mar Walmer  11/25/2012, 8:16 AM

## 2012-11-25 DIAGNOSIS — I5023 Acute on chronic systolic (congestive) heart failure: Principal | ICD-10-CM

## 2012-11-25 LAB — BASIC METABOLIC PANEL
BUN: 21 mg/dL (ref 6–23)
Chloride: 100 mEq/L (ref 96–112)
GFR calc Af Amer: 71 mL/min — ABNORMAL LOW (ref >90)
GFR calc non Af Amer: 61 mL/min — ABNORMAL LOW (ref >90)
Glucose, Bld: 96 mg/dL (ref 70–99)
Potassium: 3.8 mEq/L (ref 3.5–5.1)
Sodium: 136 mEq/L (ref 135–145)

## 2012-11-25 LAB — PRO B NATRIURETIC PEPTIDE: Pro B Natriuretic peptide (BNP): 63 pg/mL (ref 0–125)

## 2012-11-25 MED ORDER — LOSARTAN POTASSIUM 100 MG PO TABS: 50.0000 mg | ORAL_TABLET | Freq: Every day | ORAL | Status: DC

## 2012-11-25 MED ORDER — CARVEDILOL 25 MG PO TABS: 25.0000 mg | ORAL_TABLET | Freq: Two times a day (BID) | ORAL | Status: DC

## 2012-11-25 MED ORDER — LOSARTAN POTASSIUM 100 MG PO TABS: 100.0000 mg | ORAL_TABLET | Freq: Every day | ORAL | Status: DC

## 2012-11-25 MED ORDER — CARVEDILOL 25 MG PO TABS: 12.5000 mg | ORAL_TABLET | Freq: Two times a day (BID) | ORAL | Status: DC

## 2012-11-25 MED ORDER — POTASSIUM CHLORIDE CRYS ER 20 MEQ PO TBCR: 20.0000 meq | EXTENDED_RELEASE_TABLET | Freq: Two times a day (BID) | ORAL | Status: DC

## 2012-11-25 MED ORDER — FUROSEMIDE 40 MG PO TABS: 40.0000 mg | ORAL_TABLET | Freq: Two times a day (BID) | ORAL | Status: DC

## 2012-11-25 MED ORDER — SPIRONOLACTONE 25 MG PO TABS: 25.0000 mg | ORAL_TABLET | Freq: Every day | ORAL | Status: DC

## 2012-11-25 NOTE — Progress Notes (Signed)
Nutrition Education Note  RD consulted for nutrition education regarding new onset CHF.  RD provided "Heart Failure Nutrition Therapy" handout from the Academy of [employer]. Reviewed patient's dietary recall. Provided examples on ways to decrease sodium intake in diet. Discouraged intake of processed foods and use of salt shaker. Encouraged fresh fruits and vegetables as well as whole grain sources of carbohydrates to maximize fiber intake. Pt reports eating on the go a lot and consuming fast food frequently. Discussed healthier fast food options and ways to reduce sodium when eating out.   RD discussed why it is important for patient to adhere to diet recommendations, and emphasized the role of fluids, foods to avoid, and importance of weighing self daily. Teach back method used.  Expect good compliance.  Body mass index is 29.99 kg/(m^2). Pt meets criteria for Overweight based on current BMI.  Current diet order is Heart Healthy, patient is consuming approximately 100% of meals at this time. Labs and medications reviewed. No further nutrition interventions warranted at this time. RD contact information provided. Encuoraged pt to call with any questions. If additional nutrition issues arise, please re-consult RD.   Ian Malkin RD, LDN Inpatient Clinical Dietitian Pager: [phone] After Hours Pager: [phone]

## 2012-11-25 NOTE — Progress Notes (Addendum)
Wen over all discharge info/instructions. Pt aware of reduced does of coreg and cozaar. Pt drove himself here and will drive home says he feels fine. No c/o pain or dizziness.

## 2012-11-25 NOTE — Progress Notes (Signed)
Over the weekend HF meds titrated back up and he was transitioned to po diuretics. Overall his weight is down 6 pounds.   Denies SOB/PND/Orthopnea.    Scheduled Meds: . carvedilol  25 mg Oral BID WC  . enoxaparin (LOVENOX) injection  40 mg Subcutaneous Q24H  . furosemide  40 mg Oral BID  . losartan  100 mg Oral Daily  . potassium chloride  20 mEq Oral BID  . sodium chloride  3 mL Intravenous Q12H  . spironolactone  25 mg Oral Daily   Continuous Infusions:  PRN Meds:.sodium chloride, acetaminophen, ondansetron (ZOFRAN) IV, sodium chloride Filed Vitals:   11/24/12 1535 11/24/12 2110 11/25/12 0132 11/25/12 0511  BP:  89/58 109/68 105/60  Pulse:  89 78 81  Temp:  98.7 F (37.1 C) 98.8 F (37.1 C) 97.9 F (36.6 C)  TempSrc:  Oral Oral Oral  Resp:  20 18 18   Height: 5\' 11"  (1.803 m)     Weight: 217 lb 6 oz (98.6 kg)   214 lb 14.4 oz (97.478 kg)  SpO2:  99% 96% 96%    Intake/Output Summary (Last 24 hours) at 11/25/12 0648 Last data filed at 11/25/12 0511  Gross per 24 hour  Intake    710 ml  Output   1475 ml  Net   -765 ml    SUBJECTIVE Feels well today.  Breathing much improved. No chest pain.  LABS: Basic Metabolic Panel:  Recent Labs  56/21/30 0510 11/24/12 0600  NA 138 137  K 3.3* 3.6  CL 103 102  CO2 23 23  GLUCOSE 87 101*  BUN 14 16  CREATININE 1.07 1.07  CALCIUM 8.9 9.1   CBC:  Recent Labs  11/22/12 0921 11/22/12 0930  WBC 13.0*  --   NEUTROABS 9.5*  --   HGB 15.3 15.6  HCT 42.7 46.0  MCV 98.2  --   PLT 276  --    BNP: 367.4  Thyroid Function Tests:  Recent Labs  11/22/12 1458  TSH 0.629    Radiology/Studies:  Dg Chest Port 1 View  11/22/2012   CLINICAL DATA:  Short of breath  EXAM: PORTABLE CHEST - 1 VIEW  COMPARISON:  None.  FINDINGS: Severe diffuse bilateral airspace disease is relatively symmetric. The heart is enlarged. No significant effusion.  IMPRESSION: Severe diffuse bilateral airspace disease most consistent with  pulmonary edema.   Electronically Signed   By: Marlan Palau M.D.   On: 11/22/2012 09:35   Ecg: 11/22/12 sinus tachycardia. LVH.  Echo:Study Conclusions  - Left ventricle: The cavity size was severely dilated. Wall thickness was increased in a pattern of mild LVH. Systolic function was moderately reduced. The estimated ejection fraction was in the range of 35% to 40%. Diffuse hypokinesis. There was fusion of early and atrial contributions to ventricular filling. - Mitral valve: Mild regurgitation. - Left atrium: The atrium was mildly dilated.   PHYSICAL EXAM General: Well developed, well nourished, in no acute distress. Head: Normal Neck: Negative for carotid bruits. JVD 6 cm. Lungs: Bibasilar rales. Breathing is unlabored. Heart: RRR S1 S2 without murmurs, rubs, or gallops.  Abdomen: Soft, non-tender, non-distended with normoactive bowel sounds. No hepatomegaly.  No obvious abdominal masses. Msk:  Strength and tone appears normal for age. Extremities: No clubbing, cyanosis or edema.  Distal pedal pulses are 2+ and equal bilaterally. Neuro: Alert and oriented X 3. Moves all extremities spontaneously. Psych:  Responds to questions appropriately with a normal affect.  ASSESSMENT AND PLAN: 1.  Acute on chronic systolic CHF. EF on initial dx was 20% then improved to 55-60% with treatment. Now 35-40%. Appears euvolemic.Overall he diuresed 6 pounds.  Continue lasix 40 mg po bid and spironolactone 25 mg daily BMET and Pro BNP pending. Reinforced medication compliance, low salt food choices, and daily weights. HH if he is agreeable.  2. HTN - Stable. Continue current regimen as above.   3. Hypokalemia- BMET pending.  4. Tobacco abuse- counseled on smoking cessation.  D/C today if labs are ok.    CLEGG,AMY NP-C10/07/2012 6:48 AM   Advanced Heart Failure Team Pager [phone] (M-F; 7a - 4p)  Please contact [facility] for night-coverage after hours (4p -7a ) and weekends on  [email or web address]  Patient seen with NP, agree with the above note.  He is doing well symptomatically.  Unfortunately, EF is back down again probably due to uncontrolled HTN.  BP now controlled and he is on a good regimen.  Can go home today with close followup.   Marca Ancona 11/25/2012 8:38 AM

## 2012-11-25 NOTE — Progress Notes (Signed)
CARDIAC REHAB PHASE I   PRE:  Rate/Rhythm: 85 SR    BP: sitting 106/80    SaO2: 96 RA  MODE:  Ambulation: 700 ft   POST:  Rate/Rhythm: 82 SR    BP: sitting 110/89     SaO2: 93 RA  tolerated well, no c/o. Ed completed. Pt sts he let stress get to him, that is why he hasn't taken care of himself. Discussed ways to deal with stress, encouraged ex. Sts he is ready to quit smoking/drinking.  Unable to do CRPII due to work. [id number]  Harriet Masson CES, ACSM 11/25/2012 8:44 AM

## 2012-11-25 NOTE — Progress Notes (Signed)
Pt discharged per w/c with all belongings and discharge info aware of follow up appts with HF clinic. D/C per w/c  Accompanied by  RN.

## 2012-11-25 NOTE — Progress Notes (Signed)
Text page to HF clinic made Shawn Clegg PA aware of BP- Rt arm 90/58 Lt arm 87/60. Held am doses of Coreg and Cozaar. Pt is asymptomatic. PA  cut doses of meds in half and follow up in HF Clinic Pt aware of lowered doses and not to get up quickly from lying position. Pt says he will return to work tomorrow says he feels fine.

## 2012-12-02 ENCOUNTER — Ambulatory Visit (HOSPITAL_COMMUNITY)
Admit: 2012-12-02 | Discharge: 2012-12-02 | Disposition: A | Discharge status: Home / Self Care | Payer: BC Managed Care – PPO | Attending: Internal Medicine | Admitting: Internal Medicine

## 2012-12-02 ENCOUNTER — Telehealth (HOSPITAL_COMMUNITY): Payer: Self-pay | Admitting: Adult Health

## 2012-12-02 VITALS — BP 98/72 | HR 77 | Wt 217.0 lb

## 2012-12-02 DIAGNOSIS — I5022 Chronic systolic (congestive) heart failure: Secondary | ICD-10-CM

## 2012-12-02 DIAGNOSIS — Z9119 Patient's noncompliance with other medical treatment and regimen: Secondary | ICD-10-CM | POA: Insufficient documentation

## 2012-12-02 DIAGNOSIS — I10 Essential (primary) hypertension: Secondary | ICD-10-CM | POA: Insufficient documentation

## 2012-12-02 DIAGNOSIS — F17200 Nicotine dependence, unspecified, uncomplicated: Secondary | ICD-10-CM | POA: Insufficient documentation

## 2012-12-02 DIAGNOSIS — Z79899 Other long term (current) drug therapy: Secondary | ICD-10-CM | POA: Insufficient documentation

## 2012-12-02 DIAGNOSIS — Z91199 Patient's noncompliance with other medical treatment and regimen due to unspecified reason: Secondary | ICD-10-CM | POA: Insufficient documentation

## 2012-12-02 LAB — BASIC METABOLIC PANEL
Calcium: 10.1 mg/dL (ref 8.4–10.5)
Chloride: 101 mEq/L (ref 96–112)
GFR calc Af Amer: 52 mL/min — ABNORMAL LOW (ref >90)
GFR calc non Af Amer: 45 mL/min — ABNORMAL LOW (ref >90)
Glucose, Bld: 85 mg/dL (ref 70–99)
Potassium: 5.1 mEq/L (ref 3.5–5.1)
Sodium: 133 mEq/L — ABNORMAL LOW (ref 135–145)

## 2012-12-02 MED ORDER — FUROSEMIDE 40 MG PO TABS: 40.0000 mg | ORAL_TABLET | Freq: Every day | ORAL | Status: DC

## 2012-12-02 MED ORDER — CARVEDILOL 12.5 MG PO TABS: 12.5000 mg | ORAL_TABLET | Freq: Two times a day (BID) | ORAL | Status: DC

## 2012-12-02 NOTE — Patient Instructions (Signed)
Follow up in 3 weeks  Do the following things EVERYDAY: 1) Weigh yourself in the morning before breakfast. Write it down and keep it in a log. 2) Take your medicines as prescribed 3) Eat low salt foods-Limit salt (sodium) to 2000 mg per day.  4) Stay as active as you can everyday 5) Limit all fluids for the day to less than 2 liters 

## 2012-12-02 NOTE — Addendum Note (Signed)
Addended by: Benelli Winther, Milagros Reap on: 12/02/2012 12:01 PM   Modules accepted: Orders

## 2012-12-02 NOTE — Progress Notes (Signed)
Patient ID: Shawn Perkins, male   DOB: [birth year], 57 y.o.   MRN: [id number]  HPI: Shawn Perkins is a 57 year old African  American male with a h/o severe HTN, tobacco and ETOH use who was admitted to [facility] in September 2012 due to ADHF.     Workup showed EF 20%. Underwent cath (11/11/10) which showed normal cors. EF 20% RA 7, RV 51/4, PA 61/30, with a mean of 47, PCWP 30.  Cardiac output was 4.0.  He was diuresed aggressively and started on lisinopril, spiro, carvedilol and lasix. His discharge weight was 99.8 kg.   Spironolactone stopped in early October due to a potassium 6.2  02/06/2011 ECHO 55-60%  Admitted to [facility] 10/3 through 11/25/12 with acute/chronic systolic heart failure in setting of medication noncompliance x several months.Initially severe HTN. HF medications restarted. Repeat ECHO revealed EF back down to 35-40% from 55%.  D/C weight was 214 pounds.   ECHO 11/22/12 EF 35-40%   He returns for post hospital follow up. Feels much better. Denies SOB/PND/Orthopnea/edema. Weight at home  212-213 . Complaint with medications. Continues to smoke 1 cigarette every other week. No alcohol.  He is working again.   ROS: All systems negative except as listed in HPI, PMH and Problem List.  Past Medical History  Diagnosis Date  . Systolic heart failure     11/09/10 echo with EF 35%, 11/11/10 cath with EF 20%  . Hypertension   . Rheumatic fever     as a child  . History of cocaine abuse     use to smoke crack cocaine, last use was more than 6 years ago    Current Outpatient Prescriptions  Medication Sig Dispense Refill  . Aspirin-Salicylamide-Caffeine (BC HEADACHE POWDER PO) Take 1 packet by mouth daily as needed (pain).      . carvedilol (COREG) 25 MG tablet Take 0.5 tablets (12.5 mg total) by mouth 2 (two) times daily with a meal.  60 tablet  6  . furosemide (LASIX) 40 MG tablet Take 1 tablet (40 mg total) by mouth 2 (two) times daily.  60 tablet  6  . losartan (COZAAR) 100 MG  tablet Take 0.5 tablets (50 mg total) by mouth daily.  30 tablet  6  . potassium chloride SA (K-DUR,KLOR-CON) 20 MEQ tablet Take 1 tablet (20 mEq total) by mouth 2 (two) times daily.  60 tablet  6  . spironolactone (ALDACTONE) 25 MG tablet Take 1 tablet (25 mg total) by mouth daily.  30 tablet  6   No current facility-administered medications for this encounter.     PHYSICAL EXAM: Filed Vitals:   12/02/12 1038  BP: 98/72  Pulse: 77  Weight: 217 pounds General:  Well appearing. No resp difficulty HEENT: normal Neck: supple. JVP 5 Carotids 2+ bilaterally; no bruits. No lymphadenopathy or thryomegaly appreciated. Cor: PMI normal. Regular rate & rhythm. No rubs, gallops or murmurs. Lungs: clear Abdomen: soft, nontender, nondistended. No hepatosplenomegaly. No bruits or masses. Good bowel sounds. Extremities: no cyanosis, clubbing, rash, no edema Neuro: alert & orientedx3, cranial nerves grossly intact. Moves all 4 extremities w/o difficulty. Affect pleasant.   ASSESSMENT & PLAN: 1. Chronic Systolic Heart Failure 11/2012  EF 35-40% NYHA 1 Volume status stable. Continue lasix 40 mg bid and spironolactone 25 mg daily (will need to watch closely as K went 6.2 in 2012 and was stopped )  BP soft. Will not titrate meds. Continue carvedilol 12.5 mg twice a day Continue losartan 50 mg  daily Plan on repeat ECHO in 3 months Reinforced medication compliance, low salt food choices, and limiting fluid intake to < 2 liters per day.  Check BMET today  2. HTN BP soft back on meds. Continue losartan 50 mg daily and 12.5 mg carvedilol twice a day  3. Tobacco, Current  Reinforced smoking cessation  4. Alcohol- Currently not drinking alcohol.   Follow up in 3 weeks for medication titration.   CLEGG,AMY 11:22 AM  Patient seen and examined with Tonye Becket, NP. We discussed all aspects of the encounter. I agree with the assessment and plan as stated above.   Doing very well now that he is back on  his HF meds. Stressed need for strict compliance. Agree with labs today. May need to cut meds back a bit if BP remains low (particularly diuretics).   Valetta Mulroy,MD 11:36 AM

## 2012-12-02 NOTE — Telephone Encounter (Signed)
Provided with lab results   K 5.1 Creatinine 1.6   Instructed to stop spironolactone and cut back lasix 40 mg daily   Repeat BMET 12/12/12  at [facility] care  Mr Rakestraw verbalized understanding.   CLEGG,AMY 11:59 AM

## 2012-12-05 ENCOUNTER — Encounter (HOSPITAL_COMMUNITY): Payer: Self-pay | Admitting: Emergency Medicine

## 2012-12-05 ENCOUNTER — Encounter (HOSPITAL_COMMUNITY): Admission: EM | Disposition: A | Discharge status: Home / Self Care | Payer: Self-pay | Source: Home / Self Care

## 2012-12-05 ENCOUNTER — Inpatient Hospital Stay (HOSPITAL_COMMUNITY)
Admission: EM | Admit: 2012-12-05 | Discharge: 2012-12-11 | DRG: 330 | Disposition: A | Discharge status: Home / Self Care | Payer: BC Managed Care – PPO | Attending: General Surgery | Admitting: General Surgery

## 2012-12-05 ENCOUNTER — Encounter (HOSPITAL_COMMUNITY): Payer: BC Managed Care – PPO | Admitting: *Deleted

## 2012-12-05 ENCOUNTER — Emergency Department (HOSPITAL_COMMUNITY): Payer: BC Managed Care – PPO | Admitting: *Deleted

## 2012-12-05 DIAGNOSIS — F17200 Nicotine dependence, unspecified, uncomplicated: Secondary | ICD-10-CM | POA: Diagnosis present

## 2012-12-05 DIAGNOSIS — F1410 Cocaine abuse, uncomplicated: Secondary | ICD-10-CM | POA: Diagnosis present

## 2012-12-05 DIAGNOSIS — Y9210 Unspecified residential institution as the place of occurrence of the external cause: Secondary | ICD-10-CM | POA: Diagnosis not present

## 2012-12-05 DIAGNOSIS — I428 Other cardiomyopathies: Secondary | ICD-10-CM | POA: Diagnosis present

## 2012-12-05 DIAGNOSIS — I10 Essential (primary) hypertension: Secondary | ICD-10-CM

## 2012-12-05 DIAGNOSIS — I509 Heart failure, unspecified: Secondary | ICD-10-CM

## 2012-12-05 DIAGNOSIS — F1010 Alcohol abuse, uncomplicated: Secondary | ICD-10-CM | POA: Diagnosis present

## 2012-12-05 DIAGNOSIS — K929 Disease of digestive system, unspecified: Secondary | ICD-10-CM | POA: Diagnosis not present

## 2012-12-05 DIAGNOSIS — F1210 Cannabis abuse, uncomplicated: Secondary | ICD-10-CM | POA: Diagnosis present

## 2012-12-05 DIAGNOSIS — K460 Unspecified abdominal hernia with obstruction, without gangrene: Secondary | ICD-10-CM

## 2012-12-05 DIAGNOSIS — K55059 Acute (reversible) ischemia of intestine, part and extent unspecified: Secondary | ICD-10-CM

## 2012-12-05 DIAGNOSIS — D649 Anemia, unspecified: Secondary | ICD-10-CM | POA: Diagnosis not present

## 2012-12-05 DIAGNOSIS — Z91199 Patient's noncompliance with other medical treatment and regimen due to unspecified reason: Secondary | ICD-10-CM

## 2012-12-05 DIAGNOSIS — Z9119 Patient's noncompliance with other medical treatment and regimen: Secondary | ICD-10-CM

## 2012-12-05 DIAGNOSIS — K420 Umbilical hernia with obstruction, without gangrene: Secondary | ICD-10-CM

## 2012-12-05 DIAGNOSIS — D72829 Elevated white blood cell count, unspecified: Secondary | ICD-10-CM

## 2012-12-05 DIAGNOSIS — Z79899 Other long term (current) drug therapy: Secondary | ICD-10-CM

## 2012-12-05 DIAGNOSIS — E876 Hypokalemia: Secondary | ICD-10-CM | POA: Diagnosis not present

## 2012-12-05 DIAGNOSIS — Z880 Allergy status to penicillin: Secondary | ICD-10-CM

## 2012-12-05 DIAGNOSIS — K559 Vascular disorder of intestine, unspecified: Secondary | ICD-10-CM | POA: Diagnosis present

## 2012-12-05 DIAGNOSIS — Y836 Removal of other organ (partial) (total) as the cause of abnormal reaction of the patient, or of later complication, without mention of misadventure at the time of the procedure: Secondary | ICD-10-CM | POA: Diagnosis not present

## 2012-12-05 DIAGNOSIS — I5022 Chronic systolic (congestive) heart failure: Secondary | ICD-10-CM

## 2012-12-05 DIAGNOSIS — R9431 Abnormal electrocardiogram [ECG] [EKG]: Secondary | ICD-10-CM

## 2012-12-05 DIAGNOSIS — K560 Paralytic ileus: Secondary | ICD-10-CM | POA: Diagnosis not present

## 2012-12-05 HISTORY — DX: Cardiac murmur, unspecified: R01.1

## 2012-12-05 HISTORY — PX: BOWEL RESECTION: SHX1257

## 2012-12-05 HISTORY — PX: SMALL INTESTINE SURGERY: SHX150

## 2012-12-05 HISTORY — DX: Shortness of breath: R06.02

## 2012-12-05 LAB — COMPREHENSIVE METABOLIC PANEL
ALT: 15 U/L (ref 0–53)
AST: 15 U/L (ref 0–37)
Albumin: 4.2 g/dL (ref 3.5–5.2)
Alkaline Phosphatase: 92 U/L (ref 39–117)
BUN: 21 mg/dL (ref 6–23)
CO2: 24 mEq/L (ref 19–32)
Chloride: 102 mEq/L (ref 96–112)
Creatinine, Ser: 1.25 mg/dL (ref 0.50–1.35)
GFR calc non Af Amer: 62 mL/min — ABNORMAL LOW (ref >90)
Potassium: 5 mEq/L (ref 3.5–5.1)
Sodium: 136 mEq/L (ref 135–145)
Total Bilirubin: 0.3 mg/dL (ref 0.3–1.2)

## 2012-12-05 LAB — CREATININE, SERUM: GFR calc non Af Amer: 81 mL/min — ABNORMAL LOW (ref >90)

## 2012-12-05 LAB — CBC WITH DIFFERENTIAL/PLATELET
Basophils Relative: 1 % (ref 0–1)
HCT: 40.7 % (ref 39.0–52.0)
Hemoglobin: 14.6 g/dL (ref 13.0–17.0)
Lymphocytes Relative: 12 % (ref 12–46)
MCHC: 35.9 g/dL (ref 30.0–36.0)
Monocytes Absolute: 0.4 10*3/uL (ref 0.1–1.0)
Monocytes Relative: 3 % (ref 3–12)
Neutro Abs: 14.1 10*3/uL — ABNORMAL HIGH (ref 1.7–7.7)
Neutrophils Relative %: 84 % — ABNORMAL HIGH (ref 43–77)
Platelets: 230 10*3/uL (ref 150–400)
RBC: 4.14 MIL/uL — ABNORMAL LOW (ref 4.22–5.81)
WBC: 16.8 10*3/uL — ABNORMAL HIGH (ref 4.0–10.5)

## 2012-12-05 LAB — CBC
HCT: 41.4 % (ref 39.0–52.0)
Hemoglobin: 14.6 g/dL (ref 13.0–17.0)
MCH: 34.5 pg — ABNORMAL HIGH (ref 26.0–34.0)
MCHC: 35.3 g/dL (ref 30.0–36.0)
WBC: 15.9 10*3/uL — ABNORMAL HIGH (ref 4.0–10.5)

## 2012-12-05 LAB — CG4 I-STAT (LACTIC ACID): Lactic Acid, Venous: 1.19 mmol/L (ref 0.5–2.2)

## 2012-12-05 SURGERY — EXCISION, SMALL INTESTINE
Anesthesia: General | Site: Abdomen | Wound class: Clean Contaminated

## 2012-12-05 MED ORDER — 0.9 % SODIUM CHLORIDE (POUR BTL) OPTIME
TOPICAL | Status: DC | PRN
  Administered 2012-12-05: 2000 mL
  Administered 2012-12-05 (×2): 1000 mL

## 2012-12-05 MED ORDER — ONDANSETRON HCL 4 MG/2ML IJ SOLN
INTRAMUSCULAR | Status: DC | PRN
  Administered 2012-12-05: 4 mg via INTRAMUSCULAR

## 2012-12-05 MED ORDER — PROPOFOL 10 MG/ML IV BOLUS
INTRAVENOUS | Status: DC | PRN
  Administered 2012-12-05: 200 mg via INTRAVENOUS

## 2012-12-05 MED ORDER — ARTIFICIAL TEARS OP OINT
TOPICAL_OINTMENT | OPHTHALMIC | Status: DC | PRN
  Administered 2012-12-05: 1 via OPHTHALMIC

## 2012-12-05 MED ORDER — LACTATED RINGERS IV SOLN
INTRAVENOUS | Status: DC
  Administered 2012-12-05 (×2): via INTRAVENOUS

## 2012-12-05 MED ORDER — LIDOCAINE HCL (CARDIAC) 20 MG/ML IV SOLN
INTRAVENOUS | Status: DC | PRN
  Administered 2012-12-05: 100 mg via INTRAVENOUS

## 2012-12-05 MED ORDER — HYDRALAZINE HCL 20 MG/ML IJ SOLN
INTRAMUSCULAR | Status: AC
  Filled 2012-12-05: qty 1

## 2012-12-05 MED ORDER — GLYCOPYRROLATE 0.2 MG/ML IJ SOLN
INTRAMUSCULAR | Status: DC | PRN
  Administered 2012-12-05: .8 mg via INTRAVENOUS

## 2012-12-05 MED ORDER — ROCURONIUM BROMIDE 100 MG/10ML IV SOLN
INTRAVENOUS | Status: DC | PRN
  Administered 2012-12-05: 50 mg via INTRAVENOUS

## 2012-12-05 MED ORDER — ONDANSETRON HCL 4 MG/2ML IJ SOLN
4.0000 mg | Freq: Once | INTRAMUSCULAR | Status: AC | PRN
  Administered 2012-12-05: 4 mg via INTRAVENOUS

## 2012-12-05 MED ORDER — HYDRALAZINE HCL 20 MG/ML IJ SOLN: 5.0000 mg | INTRAMUSCULAR | Status: DC | PRN

## 2012-12-05 MED ORDER — DIPHENHYDRAMINE HCL 12.5 MG/5ML PO ELIX: 12.5000 mg | ORAL_SOLUTION | Freq: Four times a day (QID) | ORAL | Status: DC | PRN

## 2012-12-05 MED ORDER — POTASSIUM CHLORIDE IN NACL 20-0.9 MEQ/L-% IV SOLN
INTRAVENOUS | Status: DC
  Administered 2012-12-05 – 2012-12-09 (×6): via INTRAVENOUS
  Filled 2012-12-05 (×14): qty 1000

## 2012-12-05 MED ORDER — SODIUM CHLORIDE 0.9 % IV SOLN
INTRAVENOUS | Status: DC | PRN
  Administered 2012-12-05: 12:00:00 via INTRAVENOUS

## 2012-12-05 MED ORDER — DIPHENHYDRAMINE HCL 50 MG/ML IJ SOLN: 12.5000 mg | Freq: Four times a day (QID) | INTRAMUSCULAR | Status: DC | PRN

## 2012-12-05 MED ORDER — PANTOPRAZOLE SODIUM 40 MG IV SOLR
40.0000 mg | Freq: Every day | INTRAVENOUS | Status: DC
  Administered 2012-12-05 – 2012-12-08 (×4): 40 mg via INTRAVENOUS
  Filled 2012-12-05 (×4): qty 40

## 2012-12-05 MED ORDER — HYDROMORPHONE HCL PF 1 MG/ML IJ SOLN
INTRAMUSCULAR | Status: AC
  Administered 2012-12-05: 0.5 mg via INTRAVENOUS
  Filled 2012-12-05: qty 1

## 2012-12-05 MED ORDER — NEOSTIGMINE METHYLSULFATE 1 MG/ML IJ SOLN
INTRAMUSCULAR | Status: DC | PRN
  Administered 2012-12-05: 5 mg via INTRAVENOUS

## 2012-12-05 MED ORDER — ONDANSETRON HCL 4 MG/2ML IJ SOLN
4.0000 mg | Freq: Four times a day (QID) | INTRAMUSCULAR | Status: DC | PRN
Start: 2012-12-05 — End: 2012-12-11

## 2012-12-05 MED ORDER — HYDROMORPHONE HCL PF 1 MG/ML IJ SOLN: 1.0000 mg | Freq: Once | INTRAMUSCULAR | Status: DC

## 2012-12-05 MED ORDER — ONDANSETRON HCL 4 MG/2ML IJ SOLN
INTRAMUSCULAR | Status: AC
  Administered 2012-12-05: 4 mg via INTRAVENOUS
  Filled 2012-12-05: qty 2

## 2012-12-05 MED ORDER — DEXTROSE 5 % IV SOLN
2.0000 g | Freq: Three times a day (TID) | INTRAVENOUS | Status: DC
  Administered 2012-12-05 – 2012-12-11 (×18): 2 g via INTRAVENOUS
  Filled 2012-12-05 (×22): qty 2

## 2012-12-05 MED ORDER — HYDROCODONE-ACETAMINOPHEN 5-325 MG PO TABS
1.0000 | ORAL_TABLET | ORAL | Status: DC | PRN
  Administered 2012-12-09 – 2012-12-10 (×2): 2 via ORAL
  Filled 2012-12-05 (×2): qty 2

## 2012-12-05 MED ORDER — VECURONIUM BROMIDE 10 MG IV SOLR
INTRAVENOUS | Status: DC | PRN
  Administered 2012-12-05 (×2): 2 mg via INTRAVENOUS

## 2012-12-05 MED ORDER — HYDROMORPHONE HCL PF 1 MG/ML IJ SOLN
1.0000 mg | Freq: Once | INTRAMUSCULAR | Status: AC
  Administered 2012-12-05: 1 mg via INTRAVENOUS
  Filled 2012-12-05: qty 1

## 2012-12-05 MED ORDER — FENTANYL CITRATE 0.05 MG/ML IJ SOLN
INTRAMUSCULAR | Status: DC | PRN
  Administered 2012-12-05: 100 ug via INTRAVENOUS
  Administered 2012-12-05: 50 ug via INTRAVENOUS
  Administered 2012-12-05: 150 ug via INTRAVENOUS
  Administered 2012-12-05: 50 ug via INTRAVENOUS

## 2012-12-05 MED ORDER — METOPROLOL TARTRATE 1 MG/ML IV SOLN
INTRAVENOUS | Status: DC | PRN
  Administered 2012-12-05 (×5): 1 mg via INTRAVENOUS

## 2012-12-05 MED ORDER — ENOXAPARIN SODIUM 40 MG/0.4ML ~~LOC~~ SOLN
40.0000 mg | SUBCUTANEOUS | Status: DC
  Filled 2012-12-05: qty 0.4

## 2012-12-05 MED ORDER — HYDROMORPHONE HCL PF 1 MG/ML IJ SOLN
0.2500 mg | INTRAMUSCULAR | Status: DC | PRN
  Administered 2012-12-05 (×6): 0.5 mg via INTRAVENOUS

## 2012-12-05 MED ORDER — HYDROMORPHONE HCL PF 1 MG/ML IJ SOLN
0.5000 mg | INTRAMUSCULAR | Status: DC | PRN
  Administered 2012-12-05 – 2012-12-06 (×4): 1 mg via INTRAVENOUS
  Filled 2012-12-05 (×4): qty 1

## 2012-12-05 SURGICAL SUPPLY — 46 items
BLADE SURG ROTATE 9660 (MISCELLANEOUS) IMPLANT
CANISTER SUCTION 2500CC (MISCELLANEOUS) ×3 IMPLANT
CHLORAPREP W/TINT 26ML (MISCELLANEOUS) ×3 IMPLANT
COVER MAYO STAND STRL (DRAPES) IMPLANT
COVER SURGICAL LIGHT HANDLE (MISCELLANEOUS) ×3 IMPLANT
DRAPE LAPAROSCOPIC ABDOMINAL (DRAPES) ×3 IMPLANT
DRAPE PROXIMA HALF (DRAPES) IMPLANT
DRAPE UTILITY 15X26 W/TAPE STR (DRAPE) ×6 IMPLANT
DRAPE WARM FLUID 44X44 (DRAPE) ×3 IMPLANT
DRSG OPSITE POSTOP 4X10 (GAUZE/BANDAGES/DRESSINGS) IMPLANT
DRSG OPSITE POSTOP 4X8 (GAUZE/BANDAGES/DRESSINGS) IMPLANT
ELECT BLADE 6.5 EXT (BLADE) IMPLANT
ELECT CAUTERY BLADE 6.4 (BLADE) ×6 IMPLANT
ELECT REM PT RETURN 9FT ADLT (ELECTROSURGICAL) ×3
ELECTRODE REM PT RTRN 9FT ADLT (ELECTROSURGICAL) ×2 IMPLANT
GLOVE BIO SURGEON STRL SZ7.5 (GLOVE) ×3 IMPLANT
GLOVE BIOGEL PI IND STRL 8 (GLOVE) ×2 IMPLANT
GLOVE BIOGEL PI INDICATOR 8 (GLOVE) ×1
GOWN STRL NON-REIN LRG LVL3 (GOWN DISPOSABLE) ×6 IMPLANT
GOWN STRL REIN XL XLG (GOWN DISPOSABLE) ×3 IMPLANT
KIT BASIN OR (CUSTOM PROCEDURE TRAY) ×3 IMPLANT
KIT ROOM TURNOVER OR (KITS) ×3 IMPLANT
LIGASURE IMPACT 36 18CM CVD LR (INSTRUMENTS) ×2 IMPLANT
NS IRRIG 1000ML POUR BTL (IV SOLUTION) ×6 IMPLANT
PACK GENERAL/GYN (CUSTOM PROCEDURE TRAY) ×3 IMPLANT
PAD ARMBOARD 7.5X6 YLW CONV (MISCELLANEOUS) ×6 IMPLANT
PENCIL BUTTON HOLSTER BLD 10FT (ELECTRODE) IMPLANT
RELOAD PROXIMATE 75MM BLUE (ENDOMECHANICALS) ×6 IMPLANT
RELOAD STAPLE 75 3.8 BLU REG (ENDOMECHANICALS) IMPLANT
SPECIMEN JAR LARGE (MISCELLANEOUS) IMPLANT
SPONGE GAUZE 4X4 12PLY (GAUZE/BANDAGES/DRESSINGS) ×2 IMPLANT
SPONGE LAP 18X18 X RAY DECT (DISPOSABLE) IMPLANT
STAPLER GUN LINEAR PROX 60 (STAPLE) ×2 IMPLANT
STAPLER PROXIMATE 75MM BLUE (STAPLE) ×2 IMPLANT
STAPLER VISISTAT 35W (STAPLE) ×3 IMPLANT
SUCTION POOLE TIP (SUCTIONS) ×3 IMPLANT
SUT PDS AB 1 TP1 96 (SUTURE) ×6 IMPLANT
SUT SILK 2 0 SH CR/8 (SUTURE) ×3 IMPLANT
SUT SILK 2 0 TIES 10X30 (SUTURE) ×3 IMPLANT
SUT SILK 3 0 SH CR/8 (SUTURE) ×3 IMPLANT
SUT SILK 3 0 TIES 10X30 (SUTURE) ×3 IMPLANT
TAPE CLOTH SURG 4X10 WHT LF (GAUZE/BANDAGES/DRESSINGS) ×2 IMPLANT
TOWEL OR 17X26 10 PK STRL BLUE (TOWEL DISPOSABLE) ×3 IMPLANT
TRAY FOLEY CATH 16FRSI W/METER (SET/KITS/TRAYS/PACK) IMPLANT
TUBE CONNECTING 12X1/4 (SUCTIONS) IMPLANT
YANKAUER SUCT BULB TIP NO VENT (SUCTIONS) IMPLANT

## 2012-12-05 NOTE — Anesthesia Postprocedure Evaluation (Signed)
  Anesthesia Post-op Note  Patient: Shawn Perkins  Procedure(s) Performed: Procedure(s): SMALL BOWEL RESECTION  Patient Location: PACU  Anesthesia Type:General  Level of Consciousness: awake  Airway and Oxygen Therapy: Patient Spontanous Breathing  Post-op Pain: mild  Post-op Assessment: Post-op Vital signs reviewed  Post-op Vital Signs: Reviewed  Complications: No apparent anesthesia complications

## 2012-12-05 NOTE — Anesthesia Procedure Notes (Signed)
Procedure Name: Intubation Date/Time: 12/05/2012 12:09 PM Performed by: Jefm Miles E Pre-anesthesia Checklist: Patient identified, Emergency Drugs available, Suction available, Patient being monitored and Timeout performed Patient Re-evaluated:Patient Re-evaluated prior to inductionOxygen Delivery Method: Circle system utilized Preoxygenation: Pre-oxygenation with 100% oxygen Intubation Type: IV induction Ventilation: Two handed mask ventilation required and Oral airway inserted - appropriate to patient size Laryngoscope Size: Mac and 4 Grade View: Grade II Tube type: Oral Tube size: 7.5 mm Number of attempts: 1 Airway Equipment and Method: Stylet Placement Confirmation: ETT inserted through vocal cords under direct vision,  positive ETCO2 and breath sounds checked- equal and bilateral Secured at: 23 cm Tube secured with: Tape Dental Injury: Teeth and Oropharynx as per pre-operative assessment

## 2012-12-05 NOTE — ED Notes (Signed)
Results of lactic acid shown to Dr. Wickline 

## 2012-12-05 NOTE — Consult Note (Deleted)
Shawn Perkins 01-26-1956  [id number].    Requesting MD: Dr. Bebe Shaggy Chief Complaint/Reason for Consult: periumbilical pain HPI:  57 y/o AA male with PMH systolic HF, severe HTN, h/o cocaine, alcohol, tobacco abuse presented to [facility] on 12/05/12 am with periumbilical abdominal pain as his hernia site.  Says it started this morning and became more severe.  Associated with nausea and vomiting.  Last BM was this morning was loose.  Last meal was last night.  Notes having the hernia for many years, but it was much smaller and became larger over the last 3 years.  No fevers/chills.  Drinks alcohol very heavily 24-48 beers over a weekend.  H/o crack cocaine use.  Denies swelling in his feet, no orthopnea, SOB/CP, no trouble urinating.  ROS: All systems reviewed and otherwise negative except for as above  Family History  Problem Relation Age of Onset  . Heart disease Neg Hx     Past Medical History  Diagnosis Date  . Systolic heart failure     11/09/10 echo with EF 35%, 11/11/10 cath with EF 20%  . Hypertension   . Rheumatic fever     as a child  . History of cocaine abuse     use to smoke crack cocaine, last use was more than 6 years ago    History reviewed. No pertinent past surgical history.  Social History:  reports that he has been smoking Cigarettes.  He has been smoking about 0.00 packs per day. He does not have any smokeless tobacco history on file. He reports that he drinks alcohol. He reports that he uses illicit drugs.  Allergies:  Allergies  Allergen Reactions  . Penicillins Other (See Comments)    Childhood reaction     (Not in a hospital admission)  Blood pressure 153/97, pulse 67, temperature 97 F (36.1 C), temperature source Oral, resp. rate 20, SpO2 99.00%. Physical Exam: General: pleasant, WD/WN AA male who is laying in bed in NAD HEENT: head is normocephalic, atraumatic.  Sclera are noninjected.  PERRL.  Ears and nose without any masses or lesions.  Mouth is pink  and moist Heart: regular, rate, and rhythm.  No obvious murmurs, gallops, or rubs noted.  Palpable pedal pulses bilaterally Lungs: CTAB, no wheezes, rhonchi, or rales noted.  Respiratory effort nonlabored Abd: soft, moderate tenderness to palpation periumbilical area, ND, +BS, 10cm x 5cm umbilical hernia/mass, darkened skin around the hernia MS: all 4 extremities are symmetrical with no cyanosis, clubbing, or edema. Skin: warm and dry with no masses, lesions, or rashes Psych: A&Ox3 with an appropriate affect.  Results for orders placed during the hospital encounter of 12/05/12 (from the past 48 hour(s))  CBC WITH DIFFERENTIAL     Status: Abnormal   Collection Time    12/05/12  9:25 AM      Result Value Range   WBC 16.8 (*) 4.0 - 10.5 K/uL   RBC 4.14 (*) 4.22 - 5.81 MIL/uL   Hemoglobin 14.6  13.0 - 17.0 g/dL   HCT 56.2  13.0 - 86.5 %   MCV 98.3  78.0 - 100.0 fL   MCH 35.3 (*) 26.0 - 34.0 pg   MCHC 35.9  30.0 - 36.0 g/dL   RDW 78.4  69.6 - 29.5 %   Platelets 230  150 - 400 K/uL   Neutrophils Relative % 84 (*) 43 - 77 %   Neutro Abs 14.1 (*) 1.7 - 7.7 K/uL   Lymphocytes Relative 12  12 - 46 %  Lymphs Abs 2.1  0.7 - 4.0 K/uL   Monocytes Relative 3  3 - 12 %   Monocytes Absolute 0.4  0.1 - 1.0 K/uL   Eosinophils Relative 1  0 - 5 %   Eosinophils Absolute 0.2  0.0 - 0.7 K/uL   Basophils Relative 1  0 - 1 %   Basophils Absolute 0.1  0.0 - 0.1 K/uL  COMPREHENSIVE METABOLIC PANEL     Status: Abnormal   Collection Time    12/05/12  9:25 AM      Result Value Range   Sodium 136  135 - 145 mEq/L   Potassium 5.0  3.5 - 5.1 mEq/L   Chloride 102  96 - 112 mEq/L   CO2 24  19 - 32 mEq/L   Glucose, Bld 192 (*) 70 - 99 mg/dL   BUN 21  6 - 23 mg/dL   Creatinine, Ser 4.09  0.50 - 1.35 mg/dL   Calcium 9.6  8.4 - 81.1 mg/dL   Total Protein 8.0  6.0 - 8.3 g/dL   Albumin 4.2  3.5 - 5.2 g/dL   AST 15  0 - 37 U/L   ALT 15  0 - 53 U/L   Alkaline Phosphatase 92  39 - 117 U/L   Total Bilirubin 0.3   0.3 - 1.2 mg/dL   GFR calc non Af Amer 62 (*) >90 mL/min   GFR calc Af Amer 72 (*) >90 mL/min   Comment: (NOTE)     The eGFR has been calculated using the CKD EPI equation.     This calculation has not been validated in all clinical situations.     eGFR's persistently <90 mL/min signify possible Chronic Kidney     Disease.  CG4 I-STAT (LACTIC ACID)     Status: None   Collection Time    12/05/12  9:32 AM      Result Value Range   Lactic Acid, Venous 1.19  0.5 - 2.2 mmol/L   No results found.     Assessment/Plan Incarcerated possibly strangulated umbilical hernia Leukocytosis Elevated lactic acid Systolic HF with EF 35-40% due to medication non-compliance Uncontrolled HTN Alcohol and tobacco abuse H/o crack cocaine abuse   Plan: 1.  Emergently to the OR for diagnostic lap vs exploratory laparotomy with hernia repair, possible bowel resection if cleared by Dr. Gala Romney 2.  NPO, IVF, pain control, antiemetics, antibiotics (Cefoxitin) 3.  Consult Heart failure team for management chronic heart conditions and HTN    DORT, Shawn Perkins 12/05/2012, 10:15 AM Pager: [phone]

## 2012-12-05 NOTE — Anesthesia Preprocedure Evaluation (Addendum)
Anesthesia Evaluation  Patient identified by MRN, date of birth, ID band Patient awake    Reviewed: Allergy & Precautions, H&P , NPO status , Patient's Chart, lab work & pertinent test results  Airway Mallampati: II TM Distance: >3 FB Neck ROM: Full    Dental  (+) Teeth Intact and Chipped,    Pulmonary          Cardiovascular hypertension, +CHF     Neuro/Psych    GI/Hepatic   Endo/Other    Renal/GU      Musculoskeletal   Abdominal   Peds  Hematology   Anesthesia Other Findings Incarc Hernia w/ possible bowel involvement.  Reproductive/Obstetrics                          Anesthesia Physical Anesthesia Plan  ASA: II  Anesthesia Plan: General   Post-op Pain Management:    Induction: Intravenous  Airway Management Planned: Oral ETT  Additional Equipment:   Intra-op Plan:   Post-operative Plan: Possible Post-op intubation/ventilation  Informed Consent: I have reviewed the patients History and Physical, chart, labs and discussed the procedure including the risks, benefits and alternatives for the proposed anesthesia with the patient or authorized representative who has indicated his/her understanding and acceptance.     Plan Discussed with: CRNA, Anesthesiologist and Surgeon  Anesthesia Plan Comments:        Anesthesia Quick Evaluation

## 2012-12-05 NOTE — ED Provider Notes (Signed)
CSN: [id number]     Arrival date & time 12/05/12  0900 History   First MD Initiated Contact with Patient 12/05/12 [phone]     Chief Complaint  Patient presents with  . Abdominal Pain    Patient is a 57 y.o. male presenting with abdominal pain. The history is provided by the patient.  Abdominal Pain Pain location:  Periumbilical Pain quality: sharp   Pain radiates to:  Does not radiate Pain severity:  Severe Onset quality:  Sudden Duration: several hours. Timing:  Constant Progression:  Worsening Chronicity:  Recurrent Relieved by:  Nothing Worsened by:  Palpation Associated symptoms: nausea and vomiting   Associated symptoms: no chest pain and no shortness of breath   pt reports he woke up with abdominal pain around his umbilicus - he reports he has a hernia in this location and the hernia has gotten bigger and is now painful and he is vomiting No fever.  No cp/sob   Past Medical History  Diagnosis Date  . Systolic heart failure     11/09/10 echo with EF 35%, 11/11/10 cath with EF 20%  . Hypertension   . Rheumatic fever     as a child  . History of cocaine abuse     use to smoke crack cocaine, last use was more than 6 years ago   History reviewed. No pertinent past surgical history. Family History  Problem Relation Age of Onset  . Heart disease Neg Hx    History  Substance Use Topics  . Smoking status: Current Some Day Smoker    Types: Cigarettes  . Smokeless tobacco: Not on file  . Alcohol Use: Yes     Comment: drinks beer mainly on the weekends    Review of Systems  Respiratory: Negative for shortness of breath.   Cardiovascular: Negative for chest pain.  Gastrointestinal: Positive for nausea, vomiting and abdominal pain.  Neurological: Negative for weakness.  All other systems reviewed and are negative.    Allergies  Penicillins  Home Medications   Current Outpatient Rx  Name  Route  Sig  Dispense  Refill  . Aspirin-Salicylamide-Caffeine (BC HEADACHE  POWDER PO)   Oral   Take 1 packet by mouth daily as needed (pain).         . carvedilol (COREG) 12.5 MG tablet   Oral   Take 1 tablet (12.5 mg total) by mouth 2 (two) times daily with a meal.   60 tablet   6   . furosemide (LASIX) 40 MG tablet   Oral   Take 1 tablet (40 mg total) by mouth daily.   30 tablet   6     NEEDS FOLLOW UP APPOINTMENT FOR FURTHER REFILLS   . losartan (COZAAR) 100 MG tablet   Oral   Take 0.5 tablets (50 mg total) by mouth daily.   30 tablet   6   . potassium chloride SA (K-DUR,KLOR-CON) 20 MEQ tablet   Oral   Take 1 tablet (20 mEq total) by mouth 2 (two) times daily.   60 tablet   6   . spironolactone (ALDACTONE) 25 MG tablet   Oral   Take 25 mg by mouth daily.          BP 153/97  Pulse 67  Temp(Src) 97 F (36.1 C) (Oral)  Resp 20  SpO2 99% Physical Exam CONSTITUTIONAL: Well developed/well nourished, uncomfortable appearing HEAD: Normocephalic/atraumatic EYES: EOMI/PERRL ENMT: Mucous membranes moist NECK: supple no meningeal signs CV: S1/S2 noted, no murmurs/rubs/gallops  noted LUNGS: Lungs are clear to auscultation bilaterally, no apparent distress ABDOMEN: soft, large umbilical hernia is noted that is mildly discolored and very tender to palpation, the rest of abdominal exam is unremarkable NEURO: Pt is awake/alert, moves all extremitiesx4 EXTREMITIES: pulses normal, full ROM SKIN: warm, color normal PSYCH: no abnormalities of mood noted  ED Course  Procedures  Labs Review Labs Reviewed  CBC WITH DIFFERENTIAL  COMPREHENSIVE METABOLIC PANEL  9:37 AM Pt with abdominal pain and umbilical, concern for incarcerated hernia vs early strangulated hernia Call placed to gen. surgery 10:31 AM I spoke to surgery Will evaluate patient in the ED  Imaging Review No results found.  EKG Interpretation     Ventricular Rate:  57 PR Interval:  190 QRS Duration: 106 QT Interval:  461 QTC Calculation: 449 R Axis:   29 Text  Interpretation:  Sinus rhythm Posterior infarct, old Abnrm T, consider ischemia, anterolateral lds biphasic T wave in V3 changed from prior            MDM  No diagnosis found. Nursing notes including past medical history and social history reviewed and considered in documentation Labs/vital reviewed and considered     Joya Gaskins, MD 12/05/12 1032

## 2012-12-05 NOTE — Transfer of Care (Signed)
Immediate Anesthesia Transfer of Care Note  Patient: Shawn Perkins  Procedure(s) Performed: Procedure(s): SMALL BOWEL RESECTION  Patient Location: PACU  Anesthesia Type:General  Level of Consciousness: awake, alert  and oriented  Airway & Oxygen Therapy: Patient Spontanous Breathing and Patient connected to face mask oxygen  Post-op Assessment: Report given to PACU RN  Post vital signs: Reviewed and stable  Complications: No apparent anesthesia complications

## 2012-12-05 NOTE — H&P (Signed)
I have seen and examined the pt and agree with PA-Osborne's progress note. Incaerated possible strangulated UH. To OR for ex lap and possible bowel resection. Risks and benefits were d/w the pt which include but not limited to bowel resection, possible ostomy, possible wound infection, and possible need for further surgery.  Pt voiced understanding and wishes to proceed.

## 2012-12-05 NOTE — Preoperative (Signed)
Beta Blockers   Reason not to administer Beta Blockers:Not Applicable 

## 2012-12-05 NOTE — Op Note (Signed)
Pre Operative Diagnosis:  Strangulated UH  Post Operative Diagnosis: strangulated UH and ischemic bowel  Procedure: ex lap, small bowel resection  Surgeon: Dr. Axel Filler  Assistant: Aris Georgia, PA-C  Anesthesia: GETA  EBL: 20cc  Complications: none  Counts: reported as correct x 2  Findings:  Ischemic segment of bowel approximately 10-12cm in length.  The strangulated bowel was mid jejunum and incarcerated omnetum.  Indications for procedure:  Pt is a 57 y/o M with a long h/o UH.  He states in the last 12hr his hernia became rigid and painful.  He was seen in the ED and after evaulation and counseling, taken to the OR for exploration.  Details of the procedure:The patient was taken back to the operating room. The patient was placed in supine position with bilateral SCDs in place. After appropriate anitbiotics were confirmed, a time-out was confirmed and all facts were verified.  A #10 blade was used to incise the midline.  Bovie cautery was used to maintain hemostasis and dissection was taken down to the midline fascia.  His peritoneum was entered bluntly.  I incised his fascia toward the hernia, and the hernia entered.  There was a large amount of incarcerated omentum was then released.  The hernia was compromised of omentum and falciform lig.  After this was dissected and cleared from teh hernia sac, his bowel was eviscerated.  I ran his bowel from the ligamentum of treitz to his ileocecal valve.  There was an area in his midjejunum that was ischemic small bowel, approximately 12cm in length.  This portion was resected in the usual standard fashion with a GIA-75 staplers.  The mesentery was incised using a ligature device. Reanastomose was created using a GIA-75 stapler. The common enterotomy was then closed using a TA 40 stapler. A 3-0  Silk stitch was used as an apex stitch.  Any bleeding on the staple line was oversewn with a 3-0 silk.  The mesentaric defect was reapproximated using  a 3-0 silks in a figure of eight fashion.  The anastomosis was patent.  At this time the NG tube was secured after it was positioned into the stomach. The abdomen was washed out with sterile saline. An area of redundant skin was then cut away from the umbilicus to avoid a seroma. The fascia was then closed using a running #1 PDS in a standard fashion. The skin was closed with staples loosely.  The patient was taken to recovery room in stable condition.

## 2012-12-05 NOTE — ED Notes (Addendum)
Pt presents to ED via [facility] for evaluation of abdominal pain.  Pt has hx of umbilical hernia- when he woke up this morning he found it to be larger in size than normal- denies passing blood in stool or urine.  Pt appears pale and diaphoretic upon arrival to exam room.  Sinus rhythm on monitor.  Pt rated pain 10/10 for EMS- Fentanyl administered- pain currently 8/10.  Alert and oriented X 4.  Pt on cardiac monitor and IV in place.

## 2012-12-05 NOTE — H&P (Signed)
Shawn Perkins 26-Apr-1955   [id number].     Requesting MD: Dr. Bebe Shaggy Chief Complaint/Reason for Consult: periumbilical pain HPI:   57 y/o AA male with PMH systolic HF, severe HTN, h/o cocaine, alcohol, tobacco abuse presented to [facility] on 12/05/12 am with periumbilical abdominal pain as his hernia site.  Says it started this morning and became more severe.  Associated with nausea and vomiting.  Last BM was this morning was loose.  Last meal was last night.  Notes having the hernia for many years, but it was much smaller and became larger over the last 3 years.  No fevers/chills.  Drinks alcohol very heavily 24-48 beers over a weekend.  H/o crack cocaine use.  Denies swelling in his feet, no orthopnea, SOB/CP, no trouble urinating.   ROS: All systems reviewed and otherwise negative except for as above    Family History   Problem  Relation  Age of Onset   .  Heart disease  Neg Hx         Past Medical History   Diagnosis  Date   .  Systolic heart failure         11/09/10 echo with EF 35%, 11/11/10 cath with EF 20%   .  Hypertension     .  Rheumatic fever         as a child   .  History of cocaine abuse         use to smoke crack cocaine, last use was more than 6 years ago      PSH: tonsillectomy   Social History: reports that he has been smoking Cigarettes.  He smokes 1 pack per week. He does not have any smokeless tobacco history on file. He reports that he drinks alcohol 24-48 beers on weekends. He reports that he stopped using cocaine 7-8 years ago.  Divorced. Has kids   Allergies:   Allergies   Allergen  Reactions   .  Penicillins  Other (See Comments)       Childhood reaction         Blood pressure 153/97, pulse 67, temperature 97 F (36.1 C), temperature source Oral, resp. rate 20, SpO2 99.00%. Physical Exam: General: pleasant, WD/WN AA male who is laying in bed in NAD HEENT: head is normocephalic, atraumatic.  Sclera are noninjected.  PERRL.  Ears and nose without  any masses or lesions.  Mouth is pink and moist Heart: regular, rate, and rhythm.  No obvious murmurs, gallops, or rubs noted.  Palpable pedal pulses bilaterally. Lungs: CTAB, no wheezes, rhonchi, or rales noted.  Respiratory effort nonlabored Abd: soft, moderately tenderness to palpation to periumbilical hernia, ND, +BS, 10cm x 5cm umbilical hernia/mass, some light erythematous changes to skin covering the hernia.  This is not reducible. MS: all 4 extremities are symmetrical with no cyanosis, clubbing, minimal trace edema Skin: warm and dry with no masses, lesions, or rashes Psych: A&Ox3 with an appropriate affect.    Results for orders placed during the hospital encounter of 12/05/12 (from the past 48 hour(s))   CBC WITH DIFFERENTIAL     Status: Abnormal     Collection Time      12/05/12  9:25 AM       Result  Value  Range     WBC  16.8 (*)  4.0 - 10.5 K/uL     RBC  4.14 (*)  4.22 - 5.81 MIL/uL     Hemoglobin  14.6   13.0 - 17.0  g/dL     HCT  02.7   25.3 - 52.0 %     MCV  98.3   78.0 - 100.0 fL     MCH  35.3 (*)  26.0 - 34.0 pg     MCHC  35.9   30.0 - 36.0 g/dL     RDW  66.4   40.3 - 15.5 %     Platelets  230   150 - 400 K/uL     Neutrophils Relative %  84 (*)  43 - 77 %     Neutro Abs  14.1 (*)  1.7 - 7.7 K/uL     Lymphocytes Relative  12   12 - 46 %     Lymphs Abs  2.1   0.7 - 4.0 K/uL     Monocytes Relative  3   3 - 12 %     Monocytes Absolute  0.4   0.1 - 1.0 K/uL     Eosinophils Relative  1   0 - 5 %     Eosinophils Absolute  0.2   0.0 - 0.7 K/uL     Basophils Relative  1   0 - 1 %     Basophils Absolute  0.1   0.0 - 0.1 K/uL   COMPREHENSIVE METABOLIC PANEL     Status: Abnormal     Collection Time      12/05/12  9:25 AM       Result  Value  Range     Sodium  136   135 - 145 mEq/L     Potassium  5.0   3.5 - 5.1 mEq/L     Chloride  102   96 - 112 mEq/L     CO2  24   19 - 32 mEq/L     Glucose, Bld  192 (*)  70 - 99 mg/dL     BUN  21   6 - 23 mg/dL     Creatinine, Ser   4.74   0.50 - 1.35 mg/dL     Calcium  9.6   8.4 - 10.5 mg/dL     Total Protein  8.0   6.0 - 8.3 g/dL     Albumin  4.2   3.5 - 5.2 g/dL     AST  15   0 - 37 U/L     ALT  15   0 - 53 U/L     Alkaline Phosphatase  92   39 - 117 U/L     Total Bilirubin  0.3   0.3 - 1.2 mg/dL     GFR calc non Af Amer  62 (*)  >90 mL/min     GFR calc Af Amer  72 (*)  >90 mL/min     Comment:  (NOTE)        The eGFR has been calculated using the CKD EPI equation.        This calculation has not been validated in all clinical situations.        eGFR's persistently <90 mL/min signify possible Chronic Kidney        Disease.   CG4 I-STAT (LACTIC ACID)     Status: None     Collection Time      12/05/12  9:32 AM       Result  Value  Range     Lactic Acid, Venous  1.19   0.5 - 2.2 mmol/L    No results found.  Assessment/Plan Incarcerated umbilical hernia, possible ischemic bowel Leukocytosis Systolic HF with EF 35-40% due to medication non-compliance severe HTN Alcohol and tobacco abuse H/o crack cocaine abuse    Plan: 1.  Emergently to the OR for exploratory laparotomy with hernia repair, possible bowel resection.  He will likely not be able to be repaired with mesh given the high likelihood of ischemia.  I have informed the patient if this is the case, he has a greater chance of hernia reccurence post operatively.  I also discussed the procedure including risks, complications, and expected outcome with the patient.  He understands and is agreeable to proceed.  He does NOT want [place] to contact any family at this time or postoperatively. 2.  NPO, IVF, pain control, antiemetics, antibiotics (Cefoxitin) 3.  Consult Heart failure team for management chronic heart conditions and HTN.  Spoke with Karie Mainland and they will see him.

## 2012-12-06 DIAGNOSIS — R9431 Abnormal electrocardiogram [ECG] [EKG]: Secondary | ICD-10-CM

## 2012-12-06 DIAGNOSIS — I10 Essential (primary) hypertension: Secondary | ICD-10-CM

## 2012-12-06 DIAGNOSIS — I5022 Chronic systolic (congestive) heart failure: Secondary | ICD-10-CM

## 2012-12-06 LAB — CBC
HCT: 39.3 % (ref 39.0–52.0)
Hemoglobin: 13.8 g/dL (ref 13.0–17.0)
MCHC: 35.1 g/dL (ref 30.0–36.0)
MCV: 97.8 fL (ref 78.0–100.0)
RBC: 4.02 MIL/uL — ABNORMAL LOW (ref 4.22–5.81)
WBC: 9.9 10*3/uL (ref 4.0–10.5)

## 2012-12-06 LAB — BASIC METABOLIC PANEL
BUN: 13 mg/dL (ref 6–23)
Chloride: 108 mEq/L (ref 96–112)
GFR calc Af Amer: 84 mL/min — ABNORMAL LOW (ref >90)
Glucose, Bld: 102 mg/dL — ABNORMAL HIGH (ref 70–99)
Potassium: 4 mEq/L (ref 3.5–5.1)

## 2012-12-06 MED ORDER — METOPROLOL TARTRATE 1 MG/ML IV SOLN
5.0000 mg | Freq: Four times a day (QID) | INTRAVENOUS | Status: DC
  Administered 2012-12-06 – 2012-12-09 (×12): 5 mg via INTRAVENOUS
  Filled 2012-12-06 (×16): qty 5

## 2012-12-06 MED ORDER — CHLORHEXIDINE GLUCONATE 0.12 % MT SOLN
15.0000 mL | Freq: Two times a day (BID) | OROMUCOSAL | Status: DC
  Administered 2012-12-06 – 2012-12-09 (×8): 15 mL via OROMUCOSAL
  Filled 2012-12-06 (×8): qty 15

## 2012-12-06 MED ORDER — ENOXAPARIN SODIUM 40 MG/0.4ML ~~LOC~~ SOLN
40.0000 mg | SUBCUTANEOUS | Status: DC
Start: 2012-12-07 — End: 2012-12-11
  Administered 2012-12-07 – 2012-12-10 (×4): 40 mg via SUBCUTANEOUS
  Filled 2012-12-06 (×6): qty 0.4

## 2012-12-06 MED ORDER — BIOTENE DRY MOUTH MT LIQD
15.0000 mL | Freq: Two times a day (BID) | OROMUCOSAL | Status: DC
  Administered 2012-12-06 – 2012-12-09 (×6): 15 mL via OROMUCOSAL

## 2012-12-06 MED ORDER — HYDROMORPHONE 0.3 MG/ML IV SOLN
INTRAVENOUS | Status: DC
  Administered 2012-12-06: 11:00:00 via INTRAVENOUS
  Administered 2012-12-06: 0.599 mg via INTRAVENOUS
  Administered 2012-12-07: 0.99 mg via INTRAVENOUS
  Administered 2012-12-07: 0.4 mg via INTRAVENOUS
  Administered 2012-12-07: 0.2 mg via INTRAVENOUS
  Administered 2012-12-07: 0.599 mg via INTRAVENOUS
  Administered 2012-12-07: 0.4 mg via INTRAVENOUS
  Administered 2012-12-08: 10:00:00 via INTRAVENOUS
  Administered 2012-12-08 (×2): 0.6 mg via INTRAVENOUS
  Administered 2012-12-08: 0.3 mg via INTRAVENOUS
  Administered 2012-12-08: 0.6 mg via INTRAVENOUS
  Administered 2012-12-08 (×2): 0.3 mg via INTRAVENOUS
  Administered 2012-12-09: 1.5 mg via INTRAVENOUS
  Administered 2012-12-09: 0.9 mg via INTRAVENOUS
  Filled 2012-12-06 (×2): qty 25

## 2012-12-06 MED ORDER — LORAZEPAM 2 MG/ML IJ SOLN
1.0000 mg | Freq: Four times a day (QID) | INTRAMUSCULAR | Status: AC | PRN
  Filled 2012-12-06: qty 1

## 2012-12-06 MED ORDER — LORAZEPAM 2 MG/ML IJ SOLN
0.0000 mg | Freq: Two times a day (BID) | INTRAMUSCULAR | Status: AC
  Administered 2012-12-08: 1 mg via INTRAVENOUS
  Filled 2012-12-06: qty 1

## 2012-12-06 MED ORDER — ADULT MULTIVITAMIN W/MINERALS CH
1.0000 | ORAL_TABLET | Freq: Every day | ORAL | Status: DC
  Administered 2012-12-07 – 2012-12-11 (×5): 1 via ORAL
  Filled 2012-12-06 (×5): qty 1

## 2012-12-06 MED ORDER — LORAZEPAM 1 MG PO TABS: 1.0000 mg | ORAL_TABLET | Freq: Four times a day (QID) | ORAL | Status: AC | PRN

## 2012-12-06 MED ORDER — DIPHENHYDRAMINE HCL 50 MG/ML IJ SOLN: 12.5000 mg | Freq: Four times a day (QID) | INTRAMUSCULAR | Status: DC | PRN

## 2012-12-06 MED ORDER — THIAMINE HCL 100 MG/ML IJ SOLN
100.0000 mg | Freq: Every day | INTRAMUSCULAR | Status: DC
  Administered 2012-12-06: 100 mg via INTRAVENOUS
  Filled 2012-12-06 (×3): qty 1
  Filled 2012-12-06: qty 2

## 2012-12-06 MED ORDER — SODIUM CHLORIDE 0.9 % IJ SOLN: 9.0000 mL | INTRAMUSCULAR | Status: DC | PRN

## 2012-12-06 MED ORDER — VITAMIN B-1 100 MG PO TABS
100.0000 mg | ORAL_TABLET | Freq: Every day | ORAL | Status: DC
  Administered 2012-12-07 – 2012-12-11 (×5): 100 mg via ORAL
  Filled 2012-12-06 (×5): qty 1

## 2012-12-06 MED ORDER — HYDRALAZINE HCL 20 MG/ML IJ SOLN
10.0000 mg | INTRAMUSCULAR | Status: DC | PRN
  Administered 2012-12-06 – 2012-12-07 (×3): 10 mg via INTRAVENOUS
  Filled 2012-12-06 (×4): qty 1

## 2012-12-06 MED ORDER — NALOXONE HCL 0.4 MG/ML IJ SOLN: 0.4000 mg | INTRAMUSCULAR | Status: DC | PRN

## 2012-12-06 MED ORDER — DIPHENHYDRAMINE HCL 12.5 MG/5ML PO ELIX: 12.5000 mg | ORAL_SOLUTION | Freq: Four times a day (QID) | ORAL | Status: DC | PRN

## 2012-12-06 MED ORDER — LORAZEPAM 2 MG/ML IJ SOLN
0.0000 mg | Freq: Four times a day (QID) | INTRAMUSCULAR | Status: AC
  Administered 2012-12-06: 2 mg via INTRAVENOUS

## 2012-12-06 MED ORDER — ONDANSETRON HCL 4 MG/2ML IJ SOLN: 4.0000 mg | Freq: Four times a day (QID) | INTRAMUSCULAR | Status: DC | PRN

## 2012-12-06 MED ORDER — FOLIC ACID 1 MG PO TABS
1.0000 mg | ORAL_TABLET | Freq: Every day | ORAL | Status: DC
  Administered 2012-12-07 – 2012-12-11 (×5): 1 mg via ORAL
  Filled 2012-12-06 (×5): qty 1

## 2012-12-06 NOTE — Progress Notes (Signed)
Patient ID: Shawn Perkins, male   DOB: 1955/02/24, 57 y.o.   MRN: [id number] 1 Day Post-Op  Subjective: Pt feels ok today.  Pain ok, but not greatly controlled.  No flatus.  Objective: Vital signs in last 24 hours: Temp:  [97.9 F (36.6 C)-98.9 F (37.2 C)] 97.9 F (36.6 C) (10/17 0957) Pulse Rate:  [63-102] 94 (10/17 0957) Resp:  [11-22] 22 (10/17 0957) BP: (141-196)/(86-126) 148/92 mmHg (10/17 0957) SpO2:  [94 %-100 %] 99 % (10/17 0957) Last BM Date: 12/04/12  Intake/Output from previous day: 10/16 0701 - 10/17 0700 In: 3010 [I.V.:3010] Out: 1400 [Urine:1250; Emesis/NG output:150] Intake/Output this shift:    PE: Abd: soft, appropriately tender, a few BS, NG with minimal output.  Incisional dressing saturated with blood.  Some fresh oozing.  Staples intact. Heart: regular rate Lungs: CTAB  Lab Results:   Recent Labs  12/05/12 1908 12/06/12 0530  WBC 15.9* 9.9  HGB 14.6 13.8  HCT 41.4 39.3  PLT 223 197   BMET  Recent Labs  12/05/12 0925 12/05/12 1908 12/06/12 0530  NA 136  --  139  K 5.0  --  4.0  CL 102  --  108  CO2 24  --  22  GLUCOSE 192*  --  102*  BUN 21  --  13  CREATININE 1.25 1.01 1.10  CALCIUM 9.6  --  8.6   PT/INR No results found for this basename: LABPROT, INR,  in the last 72 hours CMP     Component Value Date/Time   NA 139 12/06/2012 0530   K 4.0 12/06/2012 0530   CL 108 12/06/2012 0530   CO2 22 12/06/2012 0530   GLUCOSE 102* 12/06/2012 0530   BUN 13 12/06/2012 0530   CREATININE 1.10 12/06/2012 0530   CALCIUM 8.6 12/06/2012 0530   PROT 8.0 12/05/2012 0925   ALBUMIN 4.2 12/05/2012 0925   AST 15 12/05/2012 0925   ALT 15 12/05/2012 0925   ALKPHOS 92 12/05/2012 0925   BILITOT 0.3 12/05/2012 0925   GFRNONAA 73* 12/06/2012 0530   GFRAA 84* 12/06/2012 0530   Lipase     Component Value Date/Time   LIPASE 40 02/14/2012 2105       Studies/Results: No results found.  Anti-infectives: Anti-infectives   Start     Dose/Rate  Route Frequency Ordered Stop   12/05/12 1400  cefOXitin (MEFOXIN) 2 g in dextrose 5 % 50 mL IVPB     2 g 100 mL/hr over 30 Minutes Intravenous 3 times per day 12/05/12 1028         Assessment/Plan  1. S/p ex lap with primary umbilical hernia repair 2. Severe CHF, managed by Dr. Gala Romney  Plan: 1. Will leave NGT for today, but if continues to have good BS, may be able to take his tube out tomorrow. 2. Will add some lopressor until cards sees patient to get him back on his beta-blocker.  Further management and adjustment of all home meds per cardiology.  Spoke to Roslyn yesterday. 3. Give the patient a PCA for better pain control 4. Hold DVT prophylaxis due to incision bleeding, resume tomorrow if bleeding has stopped 5. Dc foley tomorrow morning 6. Begin to mobilize and pulm toilet   LOS: 1 day    Henry Utsey E 12/06/2012, 10:24 AM Pager: [phone]

## 2012-12-06 NOTE — Progress Notes (Signed)
I have seen and examined the pt and agree with PA-Osborne's progress note. Con't NGT for another 2-3d to allow anastamosis to heal Pain meds Mobilize

## 2012-12-06 NOTE — Consult Note (Signed)
Advanced Heart Failure Team Consult Note  Referring Physician: Tresa Endo PA, General Surgery    Reason for Consultation: HF and BP management  HPI:   Shawn Perkins is a 57 year old African American male with a h/o severe HTN, tobacco and ETOH use, hyperkalemia, and chronic systolic HF. He was admitted yesterday for abdominal pain and was found to have a strangulated umbilical hernia and was taken for a bowel resection.   Diagnosed with CHF and NICM in 2012.Cath 9/12 with normal cors and EF 20% which improved to 55-60% 01/2011. He was lost to follow up in the HF clinic. Stopped taking meds and recently found to have recurrent HF with EF 35-40%. Meds restarted and now following in HF clinic again. He was just seen in the HF clinic 12/02/12 and was doing well.   He remains NPO and has a NGT. SBP running in the 140-150s. We are consulted to help with BP management while NPO. + ab pain. No dyspnea. On IVFs at 100/hr. No flatus yet.    Review of Systems: [y] = yes, [ ]  = no   General: Weight gain [ ] ; Weight loss [ ] ; Anorexia [ ] ; Fatigue [ ] ; Fever [ ] ; Chills [ ] ; Weakness [ ]   Cardiac: Chest pain/pressure [ N]; Resting SOB [ N]; Exertional SOB [ ] ; Orthopnea Klaus.Mock ]; Pedal Edema [ N]; Palpitations [ ] ; Syncope [ ] ; Presyncope [ ] ; Paroxysmal nocturnal dyspnea[ ]   Pulmonary: Cough [ ] ; Wheezing[ ] ; Hemoptysis[ ] ; Sputum [ ] ; Snoring [ ]   GI: Vomiting[ ] ; Dysphagia[ ] ; Melena[ ] ; Hematochezia [ ] ; Heartburn[ ] ; Abdominal pain [ Y]; Constipation [ ] ; Diarrhea [ ] ; BRBPR [ ]   GU: Hematuria[ ] ; Dysuria [ ] ; Nocturia[ ]   Vascular: Pain in legs with walking [ ] ; Pain in feet with lying flat [ ] ; Non-healing sores [ ] ; Stroke [ ] ; TIA [ ] ; Slurred speech [ ] ;  Neuro: Headaches[ ] ; Vertigo[ ] ; Seizures[ ] ; Paresthesias[ ] ;Blurred vision [ ] ; Diplopia [ ] ; Vision changes [ ]   Ortho/Skin: Arthritis [ ] ; Joint pain [ ] ; Muscle pain [ ] ; Joint swelling [ ] ; Back Pain [ ] ; Rash [ ]   Psych: Depression[ ] ; Anxiety[ ]    Heme: Bleeding problems [ ] ; Clotting disorders [ ] ; Anemia [ ]   Endocrine: Diabetes [ ] ; Thyroid dysfunction[ ]   Home Medications Prior to Admission medications   Medication Sig Start Date End Date Taking? Authorizing Provider  Aspirin-Salicylamide-Caffeine (BC HEADACHE POWDER PO) Take 1 packet by mouth daily as needed (pain).   Yes Historical Provider, MD  carvedilol (COREG) 12.5 MG tablet Take 1 tablet (12.5 mg total) by mouth 2 (two) times daily with a meal. 12/02/12  Yes Amy D Clegg, NP  furosemide (LASIX) 40 MG tablet Take 1 tablet (40 mg total) by mouth daily. 12/02/12  Yes Amy D Clegg, NP  losartan (COZAAR) 100 MG tablet Take 0.5 tablets (50 mg total) by mouth daily. 11/25/12 11/25/13 Yes Amy D Clegg, NP  potassium chloride SA (K-DUR,KLOR-CON) 20 MEQ tablet Take 1 tablet (20 mEq total) by mouth 2 (two) times daily. 11/25/12  Yes Amy D Clegg, NP  spironolactone (ALDACTONE) 25 MG tablet Take 25 mg by mouth daily.   Yes Historical Provider, MD    Past Medical History: Past Medical History  Diagnosis Date  . Systolic heart failure     11/09/10 echo with EF 35%, 11/11/10 cath with EF 20%  . Hypertension   . Rheumatic fever     as  a child  . History of cocaine abuse     use to smoke crack cocaine, last use was more than 6 years ago  . Heart murmur   . Shortness of breath     "came in 2 wk ago for this; had fluid in my lungs" (12/05/2012)  . CHF (congestive heart failure)     acute onset/notes 11/22/2012 (12/05/2012)    Past Surgical History: Past Surgical History  Procedure Laterality Date  . Small intestine surgery  12/05/2012  . Tonsillectomy      Family History: Family History  Problem Relation Age of Onset  . Heart disease Neg Hx     Social History: History   Social History  . Marital Status: Single    Spouse Name: N/A    Number of Children: N/A  . Years of Education: N/A   Social History Main Topics  . Smoking status: Current Some Day Smoker -- 0.12 packs/day  for 42 years    Types: Cigarettes  . Smokeless tobacco: Never Used  . Alcohol Use: Yes     Comment: 12/05/2012 "no more than 12 pack beer/week; haven't had anything in 3 wks"  . Drug Use: Yes    Special: Marijuana, "Crack" cocaine     Comment: 12/05/2012 use to smoke weed & crack cocaine; none in over 6 years ago"  . Sexual Activity: Not Currently   Other Topics Concern  . None   Social History Narrative  . None    Allergies:  Allergies  Allergen Reactions  . Penicillins Other (See Comments)    Childhood reaction    Objective:    Vital Signs:   Temp:  [97.9 F (36.6 C)-98.9 F (37.2 C)] 97.9 F (36.6 C) (10/17 0957) Pulse Rate:  [63-102] 94 (10/17 0957) Resp:  [11-22] 19 (10/17 1020) BP: (141-196)/(86-126) 148/92 mmHg (10/17 0957) SpO2:  [94 %-100 %] 95 % (10/17 1020) Last BM Date: 12/04/12  Weight change: Filed Weights   12/05/12 0938  Weight: 216 lb 14.9 oz (98.4 kg)    Intake/Output:   Intake/Output Summary (Last 24 hours) at 12/06/12 1118 Last data filed at 12/06/12 0540  Gross per 24 hour  Intake   3010 ml  Output   1400 ml  Net   1610 ml     Physical Exam: General:  Fatigued appearing. No resp difficulty HEENT: normal  NGT in place Neck: supple. JVP 5-6 . Carotids 2+ bilat; no bruits. No lymphadenopathy or thryomegaly appreciated. Cor: PMI nondisplaced. Regular rate & rhythm. No rubs, gallops or murmurs. Lungs: clear Abdomen: Post-op. Dressing in place. Distended. Appropriately tender. No BS.Extremities: no cyanosis, clubbing, rash, edema Neuro: alert & orientedx3, cranial nerves grossly intact. moves all 4 extremities w/o difficulty. Affect pleasant   Labs: Basic Metabolic Panel:  Recent Labs Lab 12/02/12 1047 12/05/12 0925 12/05/12 1908 12/06/12 0530  NA 133* 136  --  139  K 5.1 5.0  --  4.0  CL 101 102  --  108  CO2 20 24  --  22  GLUCOSE 85 192*  --  102*  BUN 30* 21  --  13  CREATININE 1.65* 1.25 1.01 1.10  CALCIUM 10.1 9.6  --   8.6    Liver Function Tests:  Recent Labs Lab 12/05/12 0925  AST 15  ALT 15  ALKPHOS 92  BILITOT 0.3  PROT 8.0  ALBUMIN 4.2   No results found for this basename: LIPASE, AMYLASE,  in the last 168 hours No results found  for this basename: AMMONIA,  in the last 168 hours  CBC:  Recent Labs Lab 12/05/12 0925 12/05/12 1908 12/06/12 0530  WBC 16.8* 15.9* 9.9  NEUTROABS 14.1*  --   --   HGB 14.6 14.6 13.8  HCT 40.7 41.4 39.3  MCV 98.3 97.9 97.8  PLT 230 223 197    Cardiac Enzymes: No results found for this basename: CKTOTAL, CKMB, CKMBINDEX, TROPONINI,  in the last 168 hours  BNP: BNP (last 3 results)  Recent Labs  11/22/12 0921 11/25/12 0510  PROBNP 367.4* 63.0    CBG: No results found for this basename: GLUCAP,  in the last 168 hours  Coagulation Studies: No results found for this basename: LABPROT, INR,  in the last 72 hours  Other results: EKG: SR with LVH new TWI in anterolateral leads   Imaging:  No results found.   Medications:     Current Medications: . antiseptic oral rinse  15 mL Mouth Rinse q12n4p  . cefOXitin  2 g Intravenous Q8H  . chlorhexidine  15 mL Mouth Rinse BID  . [START ON 12/07/2012] enoxaparin (LOVENOX) injection  40 mg Subcutaneous Q24H  . folic acid  1 mg Oral Daily  . HYDROmorphone PCA 0.3 mg/mL   Intravenous Q4H  . LORazepam  0-4 mg Intravenous Q6H   Followed by  . [START ON 12/08/2012] LORazepam  0-4 mg Intravenous Q12H  . metoprolol  5 mg Intravenous Q6H  . multivitamin with minerals  1 tablet Oral Daily  . pantoprazole (PROTONIX) IV  40 mg Intravenous QHS  . thiamine  100 mg Oral Daily   Or  . thiamine  100 mg Intravenous Daily     Infusions: . 0.9 % NaCl with KCl 20 mEq / L 100 mL/hr at 12/06/12 0602      Assessment:   1) Strangulated umbilical hernia, s/p bowel resection 2) Chronic systolic HF, EF 35-40% (11/2012) 3) HTN 4) TWI on ECG  Plan/Discussion:    Shawn Perkins is well known to are team  and is a pleasant gentleman. His HF seems stable and does not appear to have any fluid on board. Agree with surgery for IV lopressor for blood pressure control.   Can leave lasix off currently, but if not taking any PO meds tomorrow would give one dose of 40 mg IV.  Ulla Potash, NP-C  Attending: Patient seen and examined with Ulla Potash, NP. We discussed all aspects of the encounter. I agree with the assessment and plan as stated above.   He looks good post-op. Appreciate [facility] care.  SBP now 120-130 on IV lopressor. Would continue. Can use hydralazine 10mg  IV q2 hr prn for SBP > 150.   Volume status looks good now. Would turn IVFs down to 75/hr. Check daily weights on standing scale.  Keep I/Os even. Can give 40 IV lasix for weight > 218 or greater.   ECG has new TWI. Cath from 10/2010 reviewed with normal cors. No CP. Suspect these are just hypertensive changes. No further w/u at this point.   We will follow.   Truman Hayward 12:48 PM  Advanced Heart Failure Team Pager [phone] (M-F; 7a - 4p)  Please contact [facility] for night-coverage after hours (4p -7a ) and weekends on [email or web address]

## 2012-12-07 LAB — CBC
HCT: 39.3 % (ref 39.0–52.0)
MCH: 33.8 pg (ref 26.0–34.0)
MCV: 97.8 fL (ref 78.0–100.0)
Platelets: 178 10*3/uL (ref 150–400)
RBC: 4.02 MIL/uL — ABNORMAL LOW (ref 4.22–5.81)
RDW: 13.2 % (ref 11.5–15.5)

## 2012-12-07 LAB — BASIC METABOLIC PANEL
BUN: 11 mg/dL (ref 6–23)
CO2: 21 mEq/L (ref 19–32)
Calcium: 8.7 mg/dL (ref 8.4–10.5)
Creatinine, Ser: 1.11 mg/dL (ref 0.50–1.35)
GFR calc Af Amer: 83 mL/min — ABNORMAL LOW (ref >90)
GFR calc non Af Amer: 72 mL/min — ABNORMAL LOW (ref >90)
Sodium: 143 mEq/L (ref 135–145)

## 2012-12-07 MED ORDER — FUROSEMIDE 10 MG/ML IJ SOLN
40.0000 mg | Freq: Once | INTRAMUSCULAR | Status: AC
  Administered 2012-12-07: 40 mg via INTRAVENOUS
  Filled 2012-12-07: qty 4

## 2012-12-07 NOTE — Progress Notes (Signed)
2 Days Post-Op  Subjective: Awake and overt. No new problems. Not nauseated, but no stool or flatus yet. NG functioning, but output low.no further bleeding from his wound. Plan to start Lovenox for DVT prophylaxis today.  Hemoglobin stable, 13.6. CBC normal now 7800. Potassium 3.8. Glucose 90. Creatinine 1.11  Appreciate management of his heart failure by cardiology.  Objective: Vital signs in last 24 hours: Temp:  [97.9 F (36.6 C)-100 F (37.8 C)] 98.7 F (37.1 C) (10/18 0551) Pulse Rate:  [91-114] 106 (10/18 0551) Resp:  [16-22] 16 (10/18 0551) BP: (148-158)/(92-102) 148/101 mmHg (10/18 0551) SpO2:  [94 %-100 %] 100 % (10/18 0551) FiO2 (%):  [35 %-40 %] 40 % (10/18 0400) Weight:  [217 lb 6.4 oz (98.612 kg)] 217 lb 6.4 oz (98.612 kg) (10/18 0500) Last BM Date: 12/04/12  Intake/Output from previous day: 10/17 0701 - 10/18 0700 In: 3162.3 [P.O.:120; I.V.:2792.3; IV Piggyback:250] Out: 1625 [Urine:1525; Emesis/NG output:100] Intake/Output this shift:    General appearance: alert. Cooperative. Mental status normal. Minimal distress. Resp: clear to auscultation bilaterally GI: abdomen is very soft. Nondistended. Silent. Wound is clean. No blood on bandage in 24 hours.  Lab Results:  Results for orders placed during the hospital encounter of 12/05/12 (from the past 24 hour(s))  CBC     Status: Abnormal   Collection Time    12/07/12  5:00 AM      Result Value Range   WBC 7.8  4.0 - 10.5 K/uL   RBC 4.02 (*) 4.22 - 5.81 MIL/uL   Hemoglobin 13.6  13.0 - 17.0 g/dL   HCT 16.1  09.6 - 04.5 %   MCV 97.8  78.0 - 100.0 fL   MCH 33.8  26.0 - 34.0 pg   MCHC 34.6  30.0 - 36.0 g/dL   RDW 40.9  81.1 - 91.4 %   Platelets 178  150 - 400 K/uL  BASIC METABOLIC PANEL     Status: Abnormal   Collection Time    12/07/12  5:00 AM      Result Value Range   Sodium 143  135 - 145 mEq/L   Potassium 3.8  3.5 - 5.1 mEq/L   Chloride 109  96 - 112 mEq/L   CO2 21  19 - 32 mEq/L   Glucose, Bld 90   70 - 99 mg/dL   BUN 11  6 - 23 mg/dL   Creatinine, Ser 7.82  0.50 - 1.35 mg/dL   Calcium 8.7  8.4 - 95.6 mg/dL   GFR calc non Af Amer 72 (*) >90 mL/min   GFR calc Af Amer 83 (*) >90 mL/min     Studies/Results: @RISRSLT24 @  . antiseptic oral rinse  15 mL Mouth Rinse q12n4p  . cefOXitin  2 g Intravenous Q8H  . chlorhexidine  15 mL Mouth Rinse BID  . enoxaparin (LOVENOX) injection  40 mg Subcutaneous Q24H  . folic acid  1 mg Oral Daily  . HYDROmorphone PCA 0.3 mg/mL   Intravenous Q4H  . LORazepam  0-4 mg Intravenous Q6H   Followed by  . [START ON 12/08/2012] LORazepam  0-4 mg Intravenous Q12H  . metoprolol  5 mg Intravenous Q6H  . multivitamin with minerals  1 tablet Oral Daily  . pantoprazole (PROTONIX) IV  40 mg Intravenous QHS  . thiamine  100 mg Oral Daily   Or  . thiamine  100 mg Intravenous Daily     Assessment/Plan: s/p Procedure(s): SMALL BOWEL RESECTION  By mouth daily #2. Exploratory  laparotomy with small bowel resection. Stable. Usual expected ileus. Continue NG tube Discontinue Foley Mobilize and ambulate in hall  DVT prophylaxis. Since no more bleeding from wound, to start Lovenox today  Chronic systolic heart failure, EF 35-40%. Defer cardiac medications selection and diuresis this to cardiology. Possible IV Lasix 40 mg today since will not be taking po meds today.  HTN    @PROBHOSP @  LOS: 2 days    Zanae Kuehnle M. Derrell Lolling, M.D., [facility] General and Minimally invasive Surgery Breast and Colorectal Surgery Office:   [phone] Pager:   [phone]  12/07/2012  . .prob

## 2012-12-07 NOTE — Progress Notes (Signed)
Primary cardiologist: Dr. Danton Sewer  Subjective:   No shortness of breath or chest pain. Some nausea.   Objective:   Temp:  [97.9 F (36.6 C)-100 F (37.8 C)] 98.7 F (37.1 C) (10/18 0551) Pulse Rate:  [91-114] 106 (10/18 0551) Resp:  [16-22] 16 (10/18 0551) BP: (148-158)/(92-102) 148/101 mmHg (10/18 0551) SpO2:  [94 %-100 %] 100 % (10/18 0551) FiO2 (%):  [35 %-40 %] 40 % (10/18 0400) Weight:  [217 lb 6.4 oz (98.612 kg)] 217 lb 6.4 oz (98.612 kg) (10/18 0500) Last BM Date: 12/04/12  Filed Weights   12/05/12 0938 12/07/12 0500  Weight: 216 lb 14.9 oz (98.4 kg) 217 lb 6.4 oz (98.612 kg)    Intake/Output Summary (Last 24 hours) at 12/07/12 0812 Last data filed at 12/07/12 0600  Gross per 24 hour  Intake 3162.33 ml  Output   1625 ml  Net 1537.33 ml   Telemetry: Sinus rhythm and tachycardia.  Exam:  General: No distress. NGT in place.  Lungs: Clear, nonlabored.  Cardiac: RRR, no gallop.  Extremities: No pitting.  Lab Results:  Basic Metabolic Panel:  Recent Labs Lab 12/05/12 0925 12/05/12 1908 12/06/12 0530 12/07/12 0500  NA 136  --  139 143  K 5.0  --  4.0 3.8  CL 102  --  108 109  CO2 24  --  22 21  GLUCOSE 192*  --  102* 90  BUN 21  --  13 11  CREATININE 1.25 1.01 1.10 1.11  CALCIUM 9.6  --  8.6 8.7    Liver Function Tests:  Recent Labs Lab 12/05/12 0925  AST 15  ALT 15  ALKPHOS 92  BILITOT 0.3  PROT 8.0  ALBUMIN 4.2    CBC:  Recent Labs Lab 12/05/12 1908 12/06/12 0530 12/07/12 0500  WBC 15.9* 9.9 7.8  HGB 14.6 13.8 13.6  HCT 41.4 39.3 39.3  MCV 97.9 97.8 97.8  PLT 223 197 178     Medications:   Scheduled Medications: . antiseptic oral rinse  15 mL Mouth Rinse q12n4p  . cefOXitin  2 g Intravenous Q8H  . chlorhexidine  15 mL Mouth Rinse BID  . enoxaparin (LOVENOX) injection  40 mg Subcutaneous Q24H  . folic acid  1 mg Oral Daily  . HYDROmorphone PCA 0.3 mg/mL   Intravenous Q4H  . LORazepam  0-4 mg Intravenous  Q6H   Followed by  . [START ON 12/08/2012] LORazepam  0-4 mg Intravenous Q12H  . metoprolol  5 mg Intravenous Q6H  . multivitamin with minerals  1 tablet Oral Daily  . pantoprazole (PROTONIX) IV  40 mg Intravenous QHS  . thiamine  100 mg Oral Daily   Or  . thiamine  100 mg Intravenous Daily     Infusions: . 0.9 % NaCl with KCl 20 mEq / L 75 mL/hr at 12/07/12 0556     PRN Medications:  diphenhydrAMINE, diphenhydrAMINE, hydrALAZINE, HYDROcodone-acetaminophen, LORazepam, LORazepam, naloxone, ondansetron (ZOFRAN) IV, sodium chloride   Assessment:   1. POD #2 status post exploratory laparotomy with small bowel resection. Still NPO.  2. NICM with LVEF 35-40%. In greater than out by 1500 cc and weight up a few pounds. Will give single dose IV Lasix. Continue IV Lopressor with PRN IV Hydrazine. Can convert back to more stable regimen once able to take POs.  3. Hypertension. SBP 140-150s.   Plan/Discussion:    Will give Lasix 40 mg IV today. Watch intake and output. Continue IV Lopressor and Hydralazine.  Hopefully will be able to take POs soon - can convert back to oral cardiac regimen and titrate. BMET AM.   Jonelle Sidle, M.D., F.A.C.C.

## 2012-12-08 LAB — CBC
MCH: 33.1 pg (ref 26.0–34.0)
Platelets: 171 10*3/uL (ref 150–400)
RBC: 3.78 MIL/uL — ABNORMAL LOW (ref 4.22–5.81)
WBC: 6.5 10*3/uL (ref 4.0–10.5)

## 2012-12-08 LAB — BASIC METABOLIC PANEL
Calcium: 8.8 mg/dL (ref 8.4–10.5)
GFR calc Af Amer: 84 mL/min — ABNORMAL LOW (ref >90)
GFR calc non Af Amer: 73 mL/min — ABNORMAL LOW (ref >90)
Glucose, Bld: 91 mg/dL (ref 70–99)
Potassium: 3.7 mEq/L (ref 3.5–5.1)
Sodium: 141 mEq/L (ref 135–145)

## 2012-12-08 MED ORDER — FUROSEMIDE 10 MG/ML IJ SOLN
40.0000 mg | Freq: Once | INTRAMUSCULAR | Status: AC
  Administered 2012-12-08: 40 mg via INTRAVENOUS
  Filled 2012-12-08 (×2): qty 4

## 2012-12-08 MED ORDER — BISACODYL 10 MG RE SUPP
10.0000 mg | Freq: Once | RECTAL | Status: AC
  Administered 2012-12-08: 10 mg via RECTAL
  Filled 2012-12-08: qty 1

## 2012-12-08 NOTE — Progress Notes (Signed)
3 Days Post-Op  Subjective: Feels better. Denies nausea. Minimal NG output. No stool or flatus. Lab work looks good. Potassium 3.7. Creatinine 1.10. Glucose 91. The CBC 6500. Hemoglobin 12.5. Proceeding IV Lasix  For diuresis per cardiology.   Objective: Vital signs in last 24 hours: Temp:  [97.7 F (36.5 C)-99.5 F (37.5 C)] 97.7 F (36.5 C) (10/19 0947) Pulse Rate:  [90-110] 107 (10/19 0947) Resp:  [15-20] 20 (10/19 0947) BP: (120-160)/(88-102) 120/88 mmHg (10/19 0947) SpO2:  [95 %-100 %] 100 % (10/19 0947) Weight:  [211 lb 6.7 oz (95.9 kg)] 211 lb 6.7 oz (95.9 kg) (10/19 0500) Last BM Date: 12/05/12  Intake/Output from previous day: 10/18 0701 - 10/19 0700 In: 1995 [P.O.:120; I.V.:1875] Out: 1700 [Urine:1600; Emesis/NG output:100] Intake/Output this shift: Total I/O In: -  Out: 600 [Emesis/NG output:600]  General appearance: alert. Mental status normal. No distress.  Looks better. Resp: clear to auscultation bilaterally GI: aabdomen is soft. Active bowel sounds. Wound clean.  Lab Results:  Results for orders placed during the hospital encounter of 12/05/12 (from the past 24 hour(s))  BASIC METABOLIC PANEL     Status: Abnormal   Collection Time    12/08/12  6:20 AM      Result Value Range   Sodium 141  135 - 145 mEq/L   Potassium 3.7  3.5 - 5.1 mEq/L   Chloride 106  96 - 112 mEq/L   CO2 21  19 - 32 mEq/L   Glucose, Bld 91  70 - 99 mg/dL   BUN 13  6 - 23 mg/dL   Creatinine, Ser 4.09  0.50 - 1.35 mg/dL   Calcium 8.8  8.4 - 81.1 mg/dL   GFR calc non Af Amer 73 (*) >90 mL/min   GFR calc Af Amer 84 (*) >90 mL/min  CBC     Status: Abnormal   Collection Time    12/08/12  6:20 AM      Result Value Range   WBC 6.5  4.0 - 10.5 K/uL   RBC 3.78 (*) 4.22 - 5.81 MIL/uL   Hemoglobin 12.5 (*) 13.0 - 17.0 g/dL   HCT 91.4 (*) 78.2 - 95.6 %   MCV 98.1  78.0 - 100.0 fL   MCH 33.1  26.0 - 34.0 pg   MCHC 33.7  30.0 - 36.0 g/dL   RDW 21.3  08.6 - 57.8 %   Platelets 171  150 -  400 K/uL     Studies/Results: @RISRSLT24 @  . antiseptic oral rinse  15 mL Mouth Rinse q12n4p  . cefOXitin  2 g Intravenous Q8H  . chlorhexidine  15 mL Mouth Rinse BID  . enoxaparin (LOVENOX) injection  40 mg Subcutaneous Q24H  . folic acid  1 mg Oral Daily  . HYDROmorphone PCA 0.3 mg/mL   Intravenous Q4H  . LORazepam  0-4 mg Intravenous Q12H  . metoprolol  5 mg Intravenous Q6H  . multivitamin with minerals  1 tablet Oral Daily  . pantoprazole (PROTONIX) IV  40 mg Intravenous QHS  . thiamine  100 mg Oral Daily   Or  . thiamine  100 mg Intravenous Daily     Assessment/Plan: s/p Procedure(s): SMALL BOWEL RESECTION  POD #3. Exploratory laparotomy with small bowel resection. Stable.  Ileus resolving.  D/c NG tube  Clear liqs.  Mobilize and ambulate in hall   DVT prophylaxis. Since no more bleeding from wound, to start Lovenox today   Chronic systolic heart failure, EF 35-40%.   Defer cardiac  medications selection and diuresis this to cardiology.   .  HTN   @PROBHOSP @  LOS: 3 days    Tamzin Bertling M 12/08/2012  . .prob

## 2012-12-08 NOTE — Progress Notes (Signed)
     Primary cardiologist: Dr. Danton Sewer  Subjective:   States he feels hungry, has had some flatus. No breathlessness.   Objective:   Temp:  [98 F (36.7 C)-99.5 F (37.5 C)] 98 F (36.7 C) (10/19 0605) Pulse Rate:  [90-117] 90 (10/19 0605) Resp:  [16-20] 18 (10/19 0605) BP: (126-160)/(85-102) 158/91 mmHg (10/19 0605) SpO2:  [95 %-98 %] 98 % (10/19 0605) Weight:  [211 lb 6.7 oz (95.9 kg)] 211 lb 6.7 oz (95.9 kg) (10/19 0500) Last BM Date: 12/04/12  Filed Weights   12/05/12 0938 12/07/12 0500 12/08/12 0500  Weight: 216 lb 14.9 oz (98.4 kg) 217 lb 6.4 oz (98.612 kg) 211 lb 6.7 oz (95.9 kg)    Intake/Output Summary (Last 24 hours) at 12/08/12 0758 Last data filed at 12/08/12 0600  Gross per 24 hour  Intake   1995 ml  Output   1700 ml  Net    295 ml   Telemetry: Sinus rhythm and tachycardia.  Exam:  General: No distress. NGT in place.  Lungs: Clear, nonlabored.  Cardiac: RRR, no gallop.  Extremities: No pitting.  Lab Results:  Basic Metabolic Panel:  Recent Labs Lab 12/06/12 0530 12/07/12 0500 12/08/12 0620  NA 139 143 141  K 4.0 3.8 3.7  CL 108 109 106  CO2 22 21 21   GLUCOSE 102* 90 91  BUN 13 11 13   CREATININE 1.10 1.11 1.10  CALCIUM 8.6 8.7 8.8    Liver Function Tests:  Recent Labs Lab 12/05/12 0925  AST 15  ALT 15  ALKPHOS 92  BILITOT 0.3  PROT 8.0  ALBUMIN 4.2    CBC:  Recent Labs Lab 12/06/12 0530 12/07/12 0500 12/08/12 0620  WBC 9.9 7.8 6.5  HGB 13.8 13.6 12.5*  HCT 39.3 39.3 37.1*  MCV 97.8 97.8 98.1  PLT 197 178 171     Medications:   Scheduled Medications: . antiseptic oral rinse  15 mL Mouth Rinse q12n4p  . cefOXitin  2 g Intravenous Q8H  . chlorhexidine  15 mL Mouth Rinse BID  . enoxaparin (LOVENOX) injection  40 mg Subcutaneous Q24H  . folic acid  1 mg Oral Daily  . HYDROmorphone PCA 0.3 mg/mL   Intravenous Q4H  . LORazepam  0-4 mg Intravenous Q6H   Followed by  . LORazepam  0-4 mg Intravenous Q12H  .  metoprolol  5 mg Intravenous Q6H  . multivitamin with minerals  1 tablet Oral Daily  . pantoprazole (PROTONIX) IV  40 mg Intravenous QHS  . thiamine  100 mg Oral Daily   Or  . thiamine  100 mg Intravenous Daily    Infusions: . 0.9 % NaCl with KCl 20 mEq / L 75 mL/hr at 12/07/12 2121    PRN Medications: diphenhydrAMINE, diphenhydrAMINE, hydrALAZINE, HYDROcodone-acetaminophen, LORazepam, LORazepam, naloxone, ondansetron (ZOFRAN) IV, sodium chloride   Assessment:   1. POD #3 status post exploratory laparotomy with small bowel resection. Still NPO with NGT.  2. NICM with LVEF 35-40%. Intake and output are more even with dose of IV Lasix yesterday. Continues on IV Lopressor with PRN IV Hydrazine.  3. Hypertension. SBP 140-150s.   Plan/Discussion:    Will give Lasix 40 mg IV again today. Watch intake and output. Continue IV Lopressor and Hydralazine for now. If he is able to tolerate POs today, we can convert back to oral cardiac regimen tomorrow. Followup BMET.   Jonelle Sidle, M.D., F.A.C.C.

## 2012-12-09 ENCOUNTER — Encounter (HOSPITAL_COMMUNITY): Payer: Self-pay | Admitting: General Surgery

## 2012-12-09 ENCOUNTER — Other Ambulatory Visit: Payer: BC Managed Care – PPO

## 2012-12-09 DIAGNOSIS — E876 Hypokalemia: Secondary | ICD-10-CM

## 2012-12-09 LAB — BASIC METABOLIC PANEL
CO2: 23 mEq/L (ref 19–32)
Calcium: 8.7 mg/dL (ref 8.4–10.5)
Chloride: 103 mEq/L (ref 96–112)
Potassium: 3.3 mEq/L — ABNORMAL LOW (ref 3.5–5.1)
Sodium: 135 mEq/L (ref 135–145)

## 2012-12-09 MED ORDER — LOSARTAN POTASSIUM 25 MG PO TABS
25.0000 mg | ORAL_TABLET | Freq: Every day | ORAL | Status: DC
  Administered 2012-12-09: 25 mg via ORAL
  Filled 2012-12-09 (×2): qty 1

## 2012-12-09 MED ORDER — HYDROMORPHONE HCL PF 1 MG/ML IJ SOLN
1.0000 mg | INTRAMUSCULAR | Status: DC | PRN
  Administered 2012-12-09: 1 mg via INTRAVENOUS
  Filled 2012-12-09: qty 1

## 2012-12-09 MED ORDER — FUROSEMIDE 40 MG PO TABS: 40.0000 mg | ORAL_TABLET | Freq: Every day | ORAL | Status: DC

## 2012-12-09 MED ORDER — CARVEDILOL 6.25 MG PO TABS
6.2500 mg | ORAL_TABLET | Freq: Two times a day (BID) | ORAL | Status: DC
  Administered 2012-12-09 – 2012-12-11 (×4): 6.25 mg via ORAL
  Filled 2012-12-09 (×7): qty 1

## 2012-12-09 MED ORDER — POTASSIUM CHLORIDE CRYS ER 20 MEQ PO TBCR
40.0000 meq | EXTENDED_RELEASE_TABLET | Freq: Once | ORAL | Status: AC
  Administered 2012-12-09: 40 meq via ORAL
  Filled 2012-12-09: qty 2

## 2012-12-09 MED ORDER — FUROSEMIDE 40 MG PO TABS
40.0000 mg | ORAL_TABLET | Freq: Every day | ORAL | Status: DC
  Administered 2012-12-10 – 2012-12-11 (×2): 40 mg via ORAL
  Filled 2012-12-09 (×2): qty 1

## 2012-12-09 MED ORDER — OXYCODONE HCL 5 MG PO TABS: 5.0000 mg | ORAL_TABLET | ORAL | Status: DC | PRN

## 2012-12-09 MED ORDER — PANTOPRAZOLE SODIUM 40 MG PO TBEC
40.0000 mg | DELAYED_RELEASE_TABLET | Freq: Every day | ORAL | Status: DC
  Administered 2012-12-09 – 2012-12-10 (×2): 40 mg via ORAL
  Filled 2012-12-09 (×2): qty 1

## 2012-12-09 NOTE — Progress Notes (Signed)
Patient interviewed and examined, agree with PA note above.  Mariella Saa MD, FACS  12/09/2012 9:56 AM

## 2012-12-09 NOTE — Progress Notes (Signed)
4 Days Post-Op  Subjective: Pt had 2 bowel movements. Voiding well.  Tolerating clears.  Minimal PCA use.  Denies sob, chest pains  Objective: Vital signs in last 24 hours: Temp:  [97.7 F (36.5 C)-99.9 F (37.7 C)] 98.2 F (36.8 C) (10/20 0525) Pulse Rate:  [88-110] 92 (10/20 0525) Resp:  [15-24] 15 (10/20 0747) BP: (115-129)/(79-93) 125/79 mmHg (10/20 0525) SpO2:  [94 %-100 %] 98 % (10/20 0747) Weight:  [218 lb 7.6 oz (99.1 kg)-220 lb 3.8 oz (99.9 kg)] 218 lb 7.6 oz (99.1 kg) (10/20 0536) Last BM Date: 12/08/12  Intake/Output from previous day: 10/19 0701 - 10/20 0700 In: 3015 [P.O.:850; I.V.:2165] Out: 1400 [Urine:800; Emesis/NG output:600] Intake/Output this shift:    General appearance: alert, cooperative, appears stated age and no distress GI: +bs abdomen is soft round and appropriately tender.  midline incison with minimal serosanguinous drainage, staples are intact.  No evidence of peritonitis.  Lab Results:   Recent Labs  12/07/12 0500 12/08/12 0620  WBC 7.8 6.5  HGB 13.6 12.5*  HCT 39.3 37.1*  PLT 178 171   BMET  Recent Labs  12/08/12 0620 12/09/12 0520  NA 141 135  K 3.7 3.3*  CL 106 103  CO2 21 23  GLUCOSE 91 91  BUN 13 12  CREATININE 1.10 1.06  CALCIUM 8.8 8.7    Anti-infectives: Anti-infectives   Start     Dose/Rate Route Frequency Ordered Stop   12/05/12 1400  cefOXitin (MEFOXIN) 2 g in dextrose 5 % 50 mL IVPB     2 g 100 mL/hr over 30 Minutes Intravenous 3 times per day 12/05/12 1028        Assessment/Plan: S/p ex lap with primary umbilical hernia repair (Dr. Derrell Lolling 12/05/12) -advance diet -DC PCA and start oral pain medication -mobilize -IS -add colace to help prevent constipation from pain meds Post operative ileus -resolved Hypokalemia -supplement Severe CHF -appreciate cardiology consult VTE prophylaxis -SCDs, lovenox  Dispo: home 1-2 days   LOS: 4 days    Bonner Puna [facility] ANP-BC Pager [phone]  12/09/2012 8:23  AM

## 2012-12-09 NOTE — Progress Notes (Signed)
Wasted PCA dilauadid 3.9 mg

## 2012-12-09 NOTE — Progress Notes (Signed)
Primary cardiologist: Dr. Danton Sewer  Subjective:    POD #4 Exploratory Lap with primary umbilical hernia repair   Yesterday he received IV metoprolol. Now taking pos. Mild abdominal pain. + BM x 2   Denies SOB/PND/Orthopnea.   BP well controlled. Still getting IVF     Objective:   Temp:  [98 F (36.7 C)-99.9 F (37.7 C)] 98 F (36.7 C) (10/20 0951) Pulse Rate:  [88-110] 96 (10/20 0951) Resp:  [14-24] 14 (10/20 0951) BP: (112-129)/(75-93) 112/75 mmHg (10/20 0951) SpO2:  [94 %-99 %] 99 % (10/20 0951) Weight:  [218 lb 7.6 oz (99.1 kg)-220 lb 3.8 oz (99.9 kg)] 218 lb 7.6 oz (99.1 kg) (10/20 0536) Last BM Date: 12/08/12  Filed Weights   12/08/12 0500 12/08/12 1712 12/09/12 0536  Weight: 211 lb 6.7 oz (95.9 kg) 220 lb 3.8 oz (99.9 kg) 218 lb 7.6 oz (99.1 kg)    Intake/Output Summary (Last 24 hours) at 12/09/12 0953 Last data filed at 12/09/12 0600  Gross per 24 hour  Intake   3015 ml  Output    800 ml  Net   2215 ml  Physical Exam:  General: Sitting in chair. No resp difficulty  HEENT: normal   Neck: supple. JVP 7-8 . Carotids 2+ bilat; no bruits. No lymphadenopathy or thryomegaly appreciated.  Cor: PMI nondisplaced. Regular rate & rhythm. No rubs, gallops or murmurs.  Lungs: clear  Abdomen: Post-op. Dressing in place. Nondistended. Appropriately tender. No BS.Extremities: no cyanosis, clubbing, rash, edema  Neuro: alert & orientedx3, cranial nerves grossly intact. moves all 4 extremities w/o difficulty. Affect pleasant    Lab Results:  Basic Metabolic Panel:  Recent Labs Lab 12/07/12 0500 12/08/12 0620 12/09/12 0520  NA 143 141 135  K 3.8 3.7 3.3*  CL 109 106 103  CO2 21 21 23   GLUCOSE 90 91 91  BUN 11 13 12   CREATININE 1.11 1.10 1.06  CALCIUM 8.7 8.8 8.7    Liver Function Tests:  Recent Labs Lab 12/05/12 0925  AST 15  ALT 15  ALKPHOS 92  BILITOT 0.3  PROT 8.0  ALBUMIN 4.2    CBC:  Recent Labs Lab 12/06/12 0530 12/07/12 0500  12/08/12 0620  WBC 9.9 7.8 6.5  HGB 13.8 13.6 12.5*  HCT 39.3 39.3 37.1*  MCV 97.8 97.8 98.1  PLT 197 178 171     Medications:   Scheduled Medications: . antiseptic oral rinse  15 mL Mouth Rinse q12n4p  . cefOXitin  2 g Intravenous Q8H  . chlorhexidine  15 mL Mouth Rinse BID  . enoxaparin (LOVENOX) injection  40 mg Subcutaneous Q24H  . folic acid  1 mg Oral Daily  . LORazepam  0-4 mg Intravenous Q12H  . metoprolol  5 mg Intravenous Q6H  . multivitamin with minerals  1 tablet Oral Daily  . pantoprazole (PROTONIX) IV  40 mg Intravenous QHS  . thiamine  100 mg Oral Daily   Or  . thiamine  100 mg Intravenous Daily    Infusions:    PRN Medications: diphenhydrAMINE, diphenhydrAMINE, hydrALAZINE, HYDROcodone-acetaminophen, HYDROmorphone (DILAUDID) injection, LORazepam, LORazepam, ondansetron (ZOFRAN) IV, oxyCODONE   Assessment:   1. POD #4 status post exploratory laparotomy with small bowel resection for incarcerated umbilical hernia.  2. NICM with LVEF 35-40%.  3. Hypertension 4. Hypokalemia   Plan/Discussion:    Now taking pos. Volume status ok. Start lasix 40 mg po daily tomorrow. Will not place on spiro due to previous hyperkalemia and elevated  creatinine associated with addition of spiro. Stop IV metoprolol. Restart carvedilol 6.25 mg twice a day. Add losartan 25 mg daily. Renal function ok. K low but  already replaced.   Tonye Becket, NP-C  Patient seen and examined with Tonye Becket, NP. We discussed all aspects of the encounter. I agree with the assessment and plan as stated above.   Doing well post-op. Now taking pos. Volume status and BP look great. Agree with above med changes. Stop IVF.   Daniel Bensimhon,MD 10:54 AM

## 2012-12-10 LAB — CBC
MCHC: 34 g/dL (ref 30.0–36.0)
Platelets: 154 10*3/uL (ref 150–400)
RBC: 3.26 MIL/uL — ABNORMAL LOW (ref 4.22–5.81)
RDW: 12.9 % (ref 11.5–15.5)

## 2012-12-10 LAB — BASIC METABOLIC PANEL
Calcium: 8.8 mg/dL (ref 8.4–10.5)
Chloride: 105 mEq/L (ref 96–112)
GFR calc Af Amer: >90 mL/min (ref >90)
GFR calc non Af Amer: >90 mL/min (ref >90)
Glucose, Bld: 82 mg/dL (ref 70–99)
Potassium: 3.5 mEq/L (ref 3.5–5.1)
Sodium: 138 mEq/L (ref 135–145)

## 2012-12-10 MED ORDER — LOSARTAN POTASSIUM 50 MG PO TABS
50.0000 mg | ORAL_TABLET | Freq: Every day | ORAL | Status: DC
  Administered 2012-12-10 – 2012-12-11 (×2): 50 mg via ORAL
  Filled 2012-12-10 (×2): qty 1

## 2012-12-10 NOTE — Progress Notes (Signed)
Primary cardiologist: Dr. Danton Sewer  Subjective:    POD #4 Exploratory Lap with primary umbilical hernia repair   Yesterday he was restarted on carvedilol and losartan.     Wants solid foods. Denies SOB/PND/Orthopnea.        Objective:   Temp:  [98 F (36.7 C)-98.5 F (36.9 C)] 98.1 F (36.7 C) (10/21 0526) Pulse Rate:  [81-96] 81 (10/21 0526) Resp:  [14-19] 16 (10/21 0526) BP: (112-145)/(73-77) 126/75 mmHg (10/21 0526) SpO2:  [97 %-99 %] 98 % (10/21 0526) Weight:  [217 lb 9.5 oz (98.7 kg)] 217 lb 9.5 oz (98.7 kg) (10/21 0526) Last BM Date: 12/09/12  Filed Weights   12/08/12 1712 12/09/12 0536 12/10/12 0526  Weight: 220 lb 3.8 oz (99.9 kg) 218 lb 7.6 oz (99.1 kg) 217 lb 9.5 oz (98.7 kg)   No intake or output data in the 24 hours ending 12/10/12 0832 Physical Exam:  General: In bed . No resp difficulty  HEENT: normal   Neck: supple. JVP 7-8 . Carotids 2+ bilat; no bruits. No lymphadenopathy or thryomegaly appreciated.  Cor: PMI nondisplaced. Regular rate & rhythm. No rubs, gallops or murmurs.  Lungs: clear  Abdomen: Post-op. Dressing in place. Nondistended. Appropriately tender. No BS.Extremities: no cyanosis, clubbing, rash, edema  Neuro: alert & orientedx3, cranial nerves grossly intact. moves all 4 extremities w/o difficulty. Affect pleasant    Lab Results:  Basic Metabolic Panel:  Recent Labs Lab 12/08/12 0620 12/09/12 0520 12/10/12 0522  NA 141 135 138  K 3.7 3.3* 3.5  CL 106 103 105  CO2 21 23 21   GLUCOSE 91 91 82  BUN 13 12 9   CREATININE 1.10 1.06 0.96  CALCIUM 8.8 8.7 8.8    Liver Function Tests:  Recent Labs Lab 12/05/12 0925  AST 15  ALT 15  ALKPHOS 92  BILITOT 0.3  PROT 8.0  ALBUMIN 4.2    CBC:  Recent Labs Lab 12/07/12 0500 12/08/12 0620 12/10/12 0522  WBC 7.8 6.5 5.4  HGB 13.6 12.5* 10.9*  HCT 39.3 37.1* 32.1*  MCV 97.8 98.1 98.5  PLT 178 171 154     Medications:   Scheduled Medications: . antiseptic  oral rinse  15 mL Mouth Rinse q12n4p  . carvedilol  6.25 mg Oral BID WC  . cefOXitin  2 g Intravenous Q8H  . chlorhexidine  15 mL Mouth Rinse BID  . enoxaparin (LOVENOX) injection  40 mg Subcutaneous Q24H  . folic acid  1 mg Oral Daily  . furosemide  40 mg Oral Daily  . LORazepam  0-4 mg Intravenous Q12H  . losartan  25 mg Oral Daily  . multivitamin with minerals  1 tablet Oral Daily  . pantoprazole  40 mg Oral QHS  . thiamine  100 mg Oral Daily    Infusions:    PRN Medications: diphenhydrAMINE, diphenhydrAMINE, hydrALAZINE, HYDROcodone-acetaminophen, HYDROmorphone (DILAUDID) injection, ondansetron (ZOFRAN) IV, oxyCODONE   Assessment:   1. POD #4 status post exploratory laparotomy with small bowel resection for incarcerated umbilical hernia.  2. NICM with LVEF 35-40%.  3. Hypertension-  4. Hypokalemia-resolved.    Plan/Discussion:     Volume status stable.  Start lasix 40 mg po daily. Continue carvedilol 6.25 mg twice a day and resume home dose of losartan 50 mg daily.   He has follow up in HF clinic scheduled for 12/23/12 at 9:30.   Tonye Becket, NP-C  Patient seen and examined with Tonye Becket, NP. We discussed all aspects of  the encounter. I agree with the assessment and plan as stated above. Cardiac status stable. Hopefully we will be able to be discharged soon. Will follow at a distance.   Daniel Bensimhon,MD 9:00 AM

## 2012-12-10 NOTE — Progress Notes (Signed)
Patient ID: Shawn Perkins, male   DOB: 1956/02/03, 57 y.o.   MRN: [id number] 5 Days Post-Op  Subjective: No complaints this morning except that he wants solid food to eat. Minimal pain.  Objective: Vital signs in last 24 hours: Temp:  [98 F (36.7 C)-98.5 F (36.9 C)] 98.1 F (36.7 C) (10/21 0526) Pulse Rate:  [81-96] 81 (10/21 0526) Resp:  [14-19] 16 (10/21 0526) BP: (112-145)/(73-77) 126/75 mmHg (10/21 0526) SpO2:  [97 %-99 %] 98 % (10/21 0526) Weight:  [217 lb 9.5 oz (98.7 kg)] 217 lb 9.5 oz (98.7 kg) (10/21 0526) Last BM Date: 12/09/12  Intake/Output from previous day:   Intake/Output this shift:    General appearance: alert, cooperative and no distress GI: normal findings: soft, non-tender Incision/Wound: clean and dry without evidence of infection  Lab Results:   Recent Labs  12/08/12 0620 12/10/12 0522  WBC 6.5 5.4  HGB 12.5* 10.9*  HCT 37.1* 32.1*  PLT 171 154   BMET  Recent Labs  12/09/12 0520 12/10/12 0522  NA 135 138  K 3.3* 3.5  CL 103 105  CO2 23 21  GLUCOSE 91 82  BUN 12 9  CREATININE 1.06 0.96  CALCIUM 8.7 8.8     Studies/Results: No results found.  Anti-infectives: Anti-infectives   Start     Dose/Rate Route Frequency Ordered Stop   12/05/12 1400  cefOXitin (MEFOXIN) 2 g in dextrose 5 % 50 mL IVPB     2 g 100 mL/hr over 30 Minutes Intravenous 3 times per day 12/05/12 1028        Assessment/Plan: s/p Procedure(s): SMALL BOWEL RESECTION Doing well without apparent complication. Advance to regular diet. Anticipate possible discharge tomorrow.   LOS: 5 days    Sabra Sessler T 12/10/2012

## 2012-12-11 DIAGNOSIS — K420 Umbilical hernia with obstruction, without gangrene: Principal | ICD-10-CM

## 2012-12-11 LAB — BASIC METABOLIC PANEL
Chloride: 106 mEq/L (ref 96–112)
Creatinine, Ser: 1.05 mg/dL (ref 0.50–1.35)
GFR calc Af Amer: 89 mL/min — ABNORMAL LOW (ref >90)
GFR calc non Af Amer: 77 mL/min — ABNORMAL LOW (ref >90)
Potassium: 3.4 mEq/L — ABNORMAL LOW (ref 3.5–5.1)

## 2012-12-11 MED ORDER — POTASSIUM CHLORIDE CRYS ER 20 MEQ PO TBCR: 20.0000 meq | EXTENDED_RELEASE_TABLET | Freq: Every day | ORAL | Status: DC

## 2012-12-11 MED ORDER — CARVEDILOL 12.5 MG PO TABS: 12.5000 mg | ORAL_TABLET | Freq: Two times a day (BID) | ORAL | Status: DC

## 2012-12-11 MED ORDER — POTASSIUM CHLORIDE CRYS ER 20 MEQ PO TBCR
40.0000 meq | EXTENDED_RELEASE_TABLET | Freq: Once | ORAL | Status: AC
  Administered 2012-12-11: 40 meq via ORAL
  Filled 2012-12-11: qty 2

## 2012-12-11 MED ORDER — HYDROCODONE-ACETAMINOPHEN 5-325 MG PO TABS: 1.0000 | ORAL_TABLET | ORAL | Status: DC | PRN

## 2012-12-11 NOTE — Progress Notes (Signed)
Primary cardiologist: Dr. Danton Sewer  Subjective:    POD #5 Exploratory Lap with primary umbilical hernia repair   Denies SOB/PND/Orthopnea.   Po intake improving. Weight stable. On po lasix.    Objective:   Temp:  [98.1 F (36.7 C)-98.5 F (36.9 C)] 98.5 F (36.9 C) (10/22 0526) Pulse Rate:  [71-87] 87 (10/22 0526) Resp:  [19] 19 (10/22 0526) BP: (101-135)/(65-94) 135/94 mmHg (10/22 0526) SpO2:  [96 %-98 %] 98 % (10/22 0526) Weight:  [224 lb (101.606 kg)] 224 lb (101.606 kg) (10/22 0500) Last BM Date: 12/09/12  Filed Weights   12/09/12 0536 12/10/12 0526 12/11/12 0500  Weight: 218 lb 7.6 oz (99.1 kg) 217 lb 9.5 oz (98.7 kg) 224 lb (101.606 kg)    Intake/Output Summary (Last 24 hours) at 12/11/12 0749 Last data filed at 12/10/12 1800  Gross per 24 hour  Intake    240 ml  Output      0 ml  Net    240 ml   Physical Exam:  General: In bed . No resp difficulty  HEENT: normal   Neck: supple. JVP 7-8 . Carotids 2+ bilat; no bruits. No lymphadenopathy or thryomegaly appreciated.  Cor: PMI nondisplaced. Regular rate & rhythm. No rubs, gallops or murmurs.  Lungs: clear  Abdomen: Post-op. Dressing in place. Nondistended. Appropriately tender. No BS.Extremities: no cyanosis, clubbing, rash, edema  Neuro: alert & orientedx3, cranial nerves grossly intact. moves all 4 extremities w/o difficulty. Affect pleasant    Lab Results:  Basic Metabolic Panel:  Recent Labs Lab 12/09/12 0520 12/10/12 0522 12/11/12 0353  NA 135 138 139  K 3.3* 3.5 3.4*  CL 103 105 106  CO2 23 21 23   GLUCOSE 91 82 86  BUN 12 9 9   CREATININE 1.06 0.96 1.05  CALCIUM 8.7 8.8 8.6    Liver Function Tests:  Recent Labs Lab 12/05/12 0925  AST 15  ALT 15  ALKPHOS 92  BILITOT 0.3  PROT 8.0  ALBUMIN 4.2    CBC:  Recent Labs Lab 12/07/12 0500 12/08/12 0620 12/10/12 0522  WBC 7.8 6.5 5.4  HGB 13.6 12.5* 10.9*  HCT 39.3 37.1* 32.1*  MCV 97.8 98.1 98.5  PLT 178 171 154      Medications:   Scheduled Medications: . carvedilol  6.25 mg Oral BID WC  . cefOXitin  2 g Intravenous Q8H  . enoxaparin (LOVENOX) injection  40 mg Subcutaneous Q24H  . folic acid  1 mg Oral Daily  . furosemide  40 mg Oral Daily  . losartan  50 mg Oral Daily  . multivitamin with minerals  1 tablet Oral Daily  . pantoprazole  40 mg Oral QHS  . thiamine  100 mg Oral Daily    Infusions:    PRN Medications: diphenhydrAMINE, diphenhydrAMINE, HYDROcodone-acetaminophen, HYDROmorphone (DILAUDID) injection, ondansetron (ZOFRAN) IV, oxyCODONE   Assessment:   1. POD #4 status post exploratory laparotomy with small bowel resection for incarcerated umbilical hernia.  2. NICM with LVEF 35-40%.  3. Hypertension-  4. Hypokalemia-resolved.    Plan/Discussion:    Will supp K. Resume carvedilol.   HF meds on d/c:  Carvedilol 12.5 mg twice a day Losartan 50 mg daily Lasix 40 mg daily Kdur 20 meq daily  He has follow up in HF clinic scheduled for 12/23/12 at 9:30.   Tonye Becket, NP-C   Patient seen and examined with Tonye Becket, NP. We discussed all aspects of the encounter. I agree with the assessment and  plan as stated above. Doing well.Agree with med changes as above. Volume status and BP stable.  We will see as outpatient. Please call if questions.   Daniel Bensimhon,MD 8:22 AM

## 2012-12-11 NOTE — Discharge Summary (Signed)
Physician Discharge Summary  Shawn Perkins EAV:[id number] DOB: 06/12/1955 DOA: 12/05/2012  PCP: Default, Provider, MD  Consultation: Dr. Sherald Barge heart failure team)  Admit date: 12/05/2012 Discharge date: 12/11/2012  Recommendations for Outpatient Follow-up:   Follow-up Information   Call Marigene Ehlers., Jed Limerick, MD. (you need a post op follow up to have staples removed.  if our office does not call by tomorrow, please give [place] a call and schedule.)    Specialty:  General Surgery   Contact information:   [address] [place] [address] [phone]       Follow up with Arvilla Meres, MD On 12/23/2012. (appt time: 9:30AM)    Specialty:  Cardiology   Contact information:   [address] [place] [address] [phone]      Discharge Diagnoses:  1. Incarcerated umbilical hernia 2. Leukocytosis 3. Systolic heart failure  4. Severe hypertension 5. Alcohol and tobacco abuse   Surgical Procedure: exploratory laparotomy with small bowel resection(Dr. Derrell Lolling 12/05/12)   Discharge Condition: stable Disposition: home  Diet recommendation: low sodium  Filed Weights   12/10/12 0526 12/11/12 0500 12/11/12 0805  Weight: 217 lb 9.5 oz (98.7 kg) 224 lb (101.606 kg) 217 lb 2.5 oz (98.5 kg)    Filed Vitals:   12/11/12 0526  BP: 135/94  Pulse: 87  Temp: 98.5 F (36.9 C)  Resp: 19      Hospital Course:  57 y/o AA male with PMH systolic HF, severe HTN, h/o cocaine, alcohol, tobacco abuse presented to [address] [facility] on 12/05/12 am with periumbilical abdominal pain at his hernia site. Says it started this morning and became more severe. Associated with nausea and vomiting. His work up showed incarcerated umbilical hernia and possible ischemic bowel. He underwent an emergent exploratory laparotomy with a SBP and hernia repair.  Dr. Kittie Plater was consulted to help manage the patients heart failure.  Post operatively, he had an NGT, PCA for pain  control.  He was mobilized. He was diuresed per cards, given IV lopressor for his high blood pressure. His diet was advanced as tolerated, PCA discontinued and oral medication started.  Hypokalemia was supplemented.  He was started on ARB, spironolactone was stopped due to renal function.  His vital signs remained stable.  He had mild anemia, which also remained stable.  White count was normal, treated with antibiotics post operatively.  On POD #6 he was tolerating a diet, ambulating in hallways, having bowel movements, pain was under good control and heart failure was stable.  He was therefore felt stable for discharge. We had a lengthy discussion regarding discharge plan, he wishes to back to work on Friday.  I explained to him the usual coarse of 3-4 weeks, avoiding lifting anything over 1 gallon, pain medication.  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  We discussed warning signs that warrant immediate attention.  He knows to follow up with Dr. Derrell Lolling to have his staples removed.    Physical Exam: General appearance: alert, cooperative, appears stated age and no distress  GI: +bs abdomen is soft round and appropriately tender. midline incison with minimal serosanguinous drainage, staples are intact. No evidence of peritonitis.   Discharge Instructions   Future Appointments Provider Department Dept Phone   12/23/2012 9:20 AM [facility] [phone]       Medication List    STOP  taking these medications       spironolactone 25 MG tablet  Commonly known as:  ALDACTONE      TAKE these medications       BC HEADACHE POWDER PO  Take 1 packet by mouth daily as needed (pain).     carvedilol 12.5 MG tablet  Commonly known as:  COREG  Take 1 tablet (12.5 mg total) by mouth 2 (two) times daily with a meal.     furosemide 40 MG tablet   Commonly known as:  LASIX  Take 1 tablet (40 mg total) by mouth daily.     HYDROcodone-acetaminophen 5-325 MG per tablet  Commonly known as:  NORCO/VICODIN  Take 1-2 tablets by mouth every 4 (four) hours as needed.     losartan 100 MG tablet  Commonly known as:  COZAAR  Take 0.5 tablets (50 mg total) by mouth daily.     potassium chloride SA 20 MEQ tablet  Commonly known as:  K-DUR,KLOR-CON  Take 1 tablet (20 mEq total) by mouth daily.           Follow-up Information   Call Marigene Ehlers., Jed Limerick, MD. (you need a post op follow up to have staples removed.  if our office does not call by tomorrow, please give [place] a call and schedule.)    Specialty:  General Surgery   Contact information:   [address] [place] [address] [phone]       Follow up with Arvilla Meres, MD On 12/23/2012. (appt time: 9:30AM)    Specialty:  Cardiology   Contact information:   [address] [place] [address] [phone]        The results of significant diagnostics from this hospitalization (including imaging, microbiology, ancillary and laboratory) are listed below for reference.    Significant Diagnostic Studies: Dg Chest Port 1 View  11/22/2012   CLINICAL DATA:  Short of breath  EXAM: PORTABLE CHEST - 1 VIEW  COMPARISON:  None.  FINDINGS: Severe diffuse bilateral airspace disease is relatively symmetric. The heart is enlarged. No significant effusion.  IMPRESSION: Severe diffuse bilateral airspace disease most consistent with pulmonary edema.   Electronically Signed   By: Marlan Palau M.D.   On: 11/22/2012 09:35    Microbiology: No results found for this or any previous visit (from the past 240 hour(s)).   Labs: Basic Metabolic Panel:  Recent Labs Lab 12/07/12 0500 12/08/12 0620 12/09/12 0520 12/10/12 0522 12/11/12 0353  NA 143 141 135 138 139  K 3.8 3.7 3.3* 3.5 3.4*  CL 109 106 103 105 106  CO2 21 21 23 21 23   GLUCOSE 90 91 91 82 86   BUN 11 13 12 9 9   CREATININE 1.11 1.10 1.06 0.96 1.05  CALCIUM 8.7 8.8 8.7 8.8 8.6   Liver Function Tests:  Recent Labs Lab 12/05/12 0925  AST 15  ALT 15  ALKPHOS 92  BILITOT 0.3  PROT 8.0  ALBUMIN 4.2   No results found for this basename: LIPASE, AMYLASE,  in the last 168 hours No results found for this basename: AMMONIA,  in the last 168 hours CBC:  Recent Labs Lab 12/05/12 0925 12/05/12 1908 12/06/12 0530 12/07/12 0500 12/08/12 0620 12/10/12 0522  WBC 16.8* 15.9* 9.9 7.8 6.5 5.4  NEUTROABS 14.1*  --   --   --   --   --   HGB 14.6 14.6 13.8 13.6 12.5* 10.9*  HCT 40.7 41.4 39.3 39.3 37.1* 32.1*  MCV 98.3 97.9 97.8 97.8 98.1 98.5  PLT 230 223 197 178 171 154   Cardiac Enzymes: No results found for this basename: CKTOTAL, CKMB, CKMBINDEX, TROPONINI,  in the last 168 hours BNP: BNP (last 3 results)  Recent Labs  11/22/12 0921 11/25/12 0510  PROBNP 367.4* 63.0   CBG: No results found for this basename: GLUCAP,  in the last 168 hours  Active Problems:   Abnormal ECG   Essential hypertension, benign   Time coordinating discharge: 30 mins  Signed:  Diania Co, ANP-BC

## 2012-12-11 NOTE — Progress Notes (Signed)
Discharge home. Home discharge instrfuction given, no question verbalized.

## 2012-12-12 ENCOUNTER — Telehealth (INDEPENDENT_AMBULATORY_CARE_PROVIDER_SITE_OTHER): Payer: Self-pay

## 2012-12-12 NOTE — Telephone Encounter (Signed)
Message copied by Maryan Puls on Thu Dec 12, 2012  3:45 PM ------      Message from: Ethlyn Gallery      Created: Wed Dec 11, 2012 10:24 AM         Hey this was sent to me but I know you were working with his schedule the other day can you please do this for him.       Thanks      ----- Message -----         From: Joanette Gula, LPN         Sent: 12/11/2012   9:15 AM           To: Ethlyn Gallery, MA                        ----- Message -----         From: Ashok Norris, NP         Sent: 12/11/2012   9:04 AM           To: [facility] Clinical Pool            Please forward to dr. Derrell Lolling nurse                  Ex lap 10/16 by dr. Derrell Lolling            Needs follow up in 5-7 days to have staples removed       ------

## 2012-12-12 NOTE — Telephone Encounter (Signed)
Called patient with nurse visit appointment for staple removal scheduled for 12/18/12 @ 11:10 am w/Nurse and po appointment scheduled for 12/26/12 @ 11:30 am w/Dr. Derrell Lolling.  Patient reports that he's doing well since his DC from the hospital.  Patient reports his bowels are moving and his voiding without difficulty.  Patient advised to contact our office if he has any questions or concerns.  Patient verbalized understanding.

## 2012-12-18 ENCOUNTER — Ambulatory Visit (INDEPENDENT_AMBULATORY_CARE_PROVIDER_SITE_OTHER): Payer: BC Managed Care – PPO | Admitting: General Surgery

## 2012-12-18 DIAGNOSIS — Z4802 Encounter for removal of sutures: Secondary | ICD-10-CM

## 2012-12-18 NOTE — Progress Notes (Signed)
Patient comes in status post small bowel resection on 12/05/2012 with staples along his midline wound. Wound is intact. No redness or drainage. Staples removed and benzoin/steri strips placed. Patient is feeling well with minimal pain. He is eating without difficulty and moving his bowels. His only complaint is itching at incision, which I advised would get better. He also questioned whether his incision would "always look like this" - I advised that the swelling would go down and it should flatten out, that it does take time, but he would always have a scar. He will keep follow up appt with Dr Derrell Lolling and call with any problems prior.

## 2012-12-18 NOTE — Patient Instructions (Signed)
Keep follow up with Dr Derrell Lolling. Call if any drainage or redness from wound. Call with any increased pain or fevers.

## 2012-12-23 ENCOUNTER — Encounter (HOSPITAL_COMMUNITY): Payer: Self-pay

## 2012-12-23 ENCOUNTER — Ambulatory Visit (HOSPITAL_COMMUNITY)
Admission: RE | Admit: 2012-12-23 | Discharge: 2012-12-23 | Disposition: A | Discharge status: Home / Self Care | Payer: BC Managed Care – PPO | Source: Ambulatory Visit | Attending: Internal Medicine | Admitting: Internal Medicine

## 2012-12-23 VITALS — BP 110/80 | HR 72 | Wt 218.8 lb

## 2012-12-23 DIAGNOSIS — I5022 Chronic systolic (congestive) heart failure: Secondary | ICD-10-CM | POA: Insufficient documentation

## 2012-12-23 DIAGNOSIS — F17200 Nicotine dependence, unspecified, uncomplicated: Secondary | ICD-10-CM | POA: Insufficient documentation

## 2012-12-23 DIAGNOSIS — I10 Essential (primary) hypertension: Secondary | ICD-10-CM

## 2012-12-23 DIAGNOSIS — Z720 Tobacco use: Secondary | ICD-10-CM

## 2012-12-23 HISTORY — DX: Umbilical hernia with obstruction, without gangrene: K42.0

## 2012-12-23 LAB — BASIC METABOLIC PANEL
BUN: 17 mg/dL (ref 6–23)
CO2: 23 mEq/L (ref 19–32)
Calcium: 10 mg/dL (ref 8.4–10.5)
Chloride: 103 mEq/L (ref 96–112)
GFR calc Af Amer: 78 mL/min — ABNORMAL LOW (ref >90)
GFR calc non Af Amer: 68 mL/min — ABNORMAL LOW (ref >90)
Sodium: 138 mEq/L (ref 135–145)

## 2012-12-23 MED ORDER — CARVEDILOL 12.5 MG PO TABS: ORAL_TABLET | ORAL | Status: DC

## 2012-12-23 NOTE — Progress Notes (Signed)
Patient ID: Shawn Perkins, male   DOB: 17-May-1955, 57 y.o.   MRN: [id number]  HPI: Mr. Biggar is a 57 year old African  American male with a h/o severe HTN, tobacco and ETOH use who was admitted to [facility] in September 2012 due to ADHF.     Workup showed EF 20%. Underwent cath (11/11/10) which showed normal cors. EF 20% RA 7, RV 51/4, PA 61/30, with a mean of 47, PCWP 30.  Cardiac output was 4.0.  He was diuresed aggressively and started on lisinopril, spiro, carvedilol and lasix. His discharge weight was 99.8 kg.   Spironolactone due to a potassium 6.2, 2012    Admitted to [facility] 11/22/2012 through 11/25/12 with acute/chronic systolic heart failure in setting of medication noncompliance x several months.Initially severe HTN. HF medications restarted. Repeat ECHO revealed  EF back down to 35-40% from 55%.  D/C weight was 214 pounds.   02/06/2011 ECHO 55-60% 10/3/14ECHO  EF 35-40%   Follow up: Since last visit was readmitted to the hospital 10/16-10/22/14 for incarcerated umbilical hernia s/p exploratory lap with small bowel resection. Feels pretty good. Still mild abdominal pain. Denies SOB, orthopnea, or CP. Able to do all ADLs and already back to work after surgery. Weight 213-215 lbs at home. Smokes an occasional cigarette, however does not drink ETOH.  ROS: All systems negative except as listed in HPI, PMH and Problem List.  Past Medical History  Diagnosis Date  . Systolic heart failure     a. 5/36/64 echo EF 35%, b. 11/11/10 cath norm cors c. ECHO (11/2012) EF 35-50%  . Hypertension   . Rheumatic fever     as a child  . History of cocaine abuse     use to smoke crack cocaine, last use was more than 6 years ago  . Heart murmur   . Shortness of breath   . CHF (congestive heart failure)   . Umbilical hernia, incarcerated 11/2012    11/2012 exploratory lap with small bwl resection    Current Outpatient Prescriptions  Medication Sig Dispense Refill  .  Aspirin-Salicylamide-Caffeine (BC HEADACHE POWDER PO) Take 1 packet by mouth daily as needed (pain).      . carvedilol (COREG) 12.5 MG tablet Take 1 tablet (12.5 mg total) by mouth 2 (two) times daily with a meal.  60 tablet  6  . furosemide (LASIX) 40 MG tablet Take 1 tablet (40 mg total) by mouth daily.  30 tablet  6  . HYDROcodone-acetaminophen (NORCO/VICODIN) 5-325 MG per tablet Take 1-2 tablets by mouth every 4 (four) hours as needed.  50 tablet  0  . losartan (COZAAR) 100 MG tablet Take 0.5 tablets (50 mg total) by mouth daily.  30 tablet  6  . potassium chloride SA (K-DUR,KLOR-CON) 20 MEQ tablet Take 20 mEq by mouth 2 (two) times daily.       No current facility-administered medications for this encounter.   Filed Vitals:   12/23/12 0934  BP: 110/80  Pulse: 72  Weight: 218 lb 12 oz (99.224 kg)  SpO2: 99%   PHYSICAL EXAM: General:  Well appearing. No resp difficulty HEENT: normal Neck: supple. JVP 7 Carotids 2+ bilaterally; no bruits. No lymphadenopathy or thryomegaly appreciated. Cor: PMI normal. Regular rate & rhythm. No rubs, gallops or murmurs. Lungs: clear Abdomen: soft, tender to palpation, midline wound intact with no drainage; nondistended. No hepatosplenomegaly. No bruits or masses. Good bowel sounds. Extremities: no cyanosis, clubbing, rash, no edema Neuro: alert & orientedx3, cranial nerves  grossly intact. Moves all 4 extremities w/o difficulty. Affect pleasant.   ASSESSMENT & PLAN:  1. Chronic Systolic Heart Failure 11/2012 EF 35-40% -  NYHA II symptoms and volume status stable. Continue lasix 40 mg daily. -  Will increase coreg to 12.5 mg/ 18.75 mg. Instructed the patient to call if he notices any increase in fatigue. Continue losartan 50 mg daily. Cleda Daub was stopped after last visit d/t K+ 5.1, but K+ 20 meq BID was continued. Will get BMET today. If K+ remains low likely could stop daily potassium and restart low dose Cleda Daub. - Reinforced the need and importance of  daily weights, a low sodium diet, and fluid restriction (less than 2 L a day). Instructed to call the HF clinic if weight increases more than 3 lbs overnight or 5 lbs in a week.  - F/u 6-8 weeks for continued medication titration, will need a repeat ECHO once meds titrated to reassess EF. 2. HTN - As above will increase coreg to 12.5/18.75 mg. Continue losartan 50 mg daily. 3. Tobacco - Praised patient for decrease in smoking and encouraged him completley stop.  Ulla Potash B NP-C 12:52 PM

## 2012-12-23 NOTE — Patient Instructions (Addendum)
Increase carvedilol (coreg) to 12.5 mg (1 tablet) in the morning and 18.75 mg (1 1/2 tablets) in the evening.  Call any issues.  Doing great.  Will call with lab results.   F/U 6 weeks  PLEASE BRING ALL MEDICATIONS TO YOUR NEXT APPOINTMENT  Do the following things EVERYDAY: 1) Weigh yourself in the morning before breakfast. Write it down and keep it in a log. 2) Take your medicines as prescribed 3) Eat low salt foods-Limit salt (sodium) to 2000 mg per day.  4) Stay as active as you can everyday 5) Limit all fluids for the day to less than 2 liters

## 2012-12-26 ENCOUNTER — Ambulatory Visit (INDEPENDENT_AMBULATORY_CARE_PROVIDER_SITE_OTHER): Payer: BC Managed Care – PPO | Admitting: General Surgery

## 2012-12-26 ENCOUNTER — Encounter (INDEPENDENT_AMBULATORY_CARE_PROVIDER_SITE_OTHER): Payer: Self-pay | Admitting: General Surgery

## 2012-12-26 VITALS — BP 114/78 | HR 64 | Resp 16 | Ht 71.0 in | Wt 219.2 lb

## 2012-12-26 DIAGNOSIS — Z9889 Other specified postprocedural states: Secondary | ICD-10-CM

## 2012-12-26 NOTE — Progress Notes (Signed)
Patient ID: AZEEZ DUNKER, male   DOB: 1955-06-03, 57 y.o.   MRN: [id number] Post op course The patient is a 57 year old male status post exploratory laparoscopy for an incarcerated umbilical hernia with necrotic bowel and bowel resection. Patient has been doing well postoperatively. He has been tolerating a normal diet and having normal bowel function. He's had no abdominal pain.  On Exam: His midline wound is clean dry and intact.   Assessment and Plan 57 year old male status post exploratory laparoscopy for an incarcerated umbilical hernia with small bowel resection and anastomosis 1. We discussed with her sections for a full 6 weeks after surgery. 2. Andrey Campanile discussed that history can potentially return in the future 3. Patient follow up as needed   Axel Filler, MD [facility] General & Minimally Invasive Surgery Trauma & Emergency Surgery

## 2013-01-02 ENCOUNTER — Encounter (INDEPENDENT_AMBULATORY_CARE_PROVIDER_SITE_OTHER): Payer: BC Managed Care – PPO | Admitting: General Surgery

## 2013-02-04 NOTE — Progress Notes (Signed)
Patient ID: Shawn Perkins, male   DOB: 1955-10-21, 57 y.o.   MRN: [id number] HPI: Shawn Perkins is a 57 year old African  American male with a h/o severe HTN, tobacco and ETOH use, and chronic systolic HF.   Cath (11/11/10) which showed normal cors. EF 20% RA 7, RV 51/4, PA 61/30, with a mean of 47, PCWP 30.  Cardiac output was 4.0.  He was diuresed aggressively and started on lisinopril, spiro, carvedilol and lasix. His discharge weight was 99.8 kg.   Spironolactone stopped due to a potassium 6.2, 2012    Admitted to [facility] 11/22/2012 -11/25/12 with acute/chronic systolic heart failure in setting of medication noncompliance x several months and severe HTN. HF medications restarted. Repeat ECHO revealed  EF back down to 35-40% from 55%.  D/C weight was 214 pounds.   02/06/2011 ECHO 55-60% 10/3/14ECHO  EF 35-40%   Follow up: Last visit increased coreg to 12.5/18.75. His Shawn Perkins was stopped d/t K+ of 5.1, however at that time was also still on K+ 20 meq BID. Feeling great. Gained weight back to baseline from before he was in hospital for incarcerated hernia. Denies SOB, orthopnea, CP or edema. Can go up and downstairs with no issues. Weight at home 218-220 lbs. Still smoking an occasional cigarette. Does not drink ETOH.   Labs 12/02/12: K+ 5.1, creatinine 1.6 12/10/12: K+ 3.5, creatinine 0.6 12/23/12: K+ 4.4, creatinine 1.17  ROS: All systems negative except as listed in HPI, PMH and Problem List.  Past Medical History  Diagnosis Date  . Systolic heart failure     a. 1/47/82 echo EF 35%, b. 11/11/10 cath norm cors c. ECHO (11/2012) EF 35-50%  . Hypertension   . Rheumatic fever     as a child  . History of cocaine abuse     use to smoke crack cocaine, last use was more than 6 years ago  . Heart murmur   . Shortness of breath   . CHF (congestive heart failure)   . Umbilical hernia, incarcerated 11/2012    11/2012 exploratory lap with small bwl resection    Current Outpatient Prescriptions   Medication Sig Dispense Refill  . carvedilol (COREG) 12.5 MG tablet Take 12.5 mg in the am and 18.75 (1 1/2 tablets) in the pm.  75 tablet  6  . furosemide (LASIX) 40 MG tablet Take 40 mg by mouth 2 (two) times daily.      Marland Kitchen losartan (COZAAR) 100 MG tablet Take 0.5 tablets (50 mg total) by mouth daily.  30 tablet  6  . potassium chloride SA (K-DUR,KLOR-CON) 20 MEQ tablet Take 20 mEq by mouth 2 (two) times daily.       No current facility-administered medications for this encounter.   Filed Vitals:   02/05/13 0843  BP: 126/80  Pulse: 71  Weight: 226 lb 6.4 oz (102.694 kg)  SpO2: 100%   PHYSICAL EXAM: General:  Well appearing. No resp difficulty HEENT: normal Neck: supple. JVP 7 Carotids 2+ bilaterally; no bruits. No lymphadenopathy or thryomegaly appreciated. Cor: PMI normal. Regular rate & rhythm. No rubs, gallops or murmurs. Lungs: clear Abdomen: soft, tender to palpation, midline wound intact with no drainage; nondistended. No hepatosplenomegaly. No bruits or masses. Good bowel sounds. Extremities: no cyanosis, clubbing, rash, no edema Neuro: alert & orientedx3, cranial nerves grossly intact. Moves all 4 extremities w/o difficulty. Affect pleasant.   ASSESSMENT & PLAN:  1. Chronic Systolic Heart Failure 11/2012 EF 35-40% -  NYHA I symptoms and volume status  stable. Continue lasix 40 mg BID. Looking back over notes, looks like patient was on 40 mg daily. Last Cr stable will call patient to confirm dose and check labs in 1 week to assess Cr. -  Will increase coreg to 18.75 mg BID.  - Continue losartan 50 mg daily.  - Shawn Perkins was stopped after last visit d/t K+ 5.1, but he was also on K+ 20 meq BID. Can try next visit to add back low dose 12.5 mg daily and stop potassium. - Reinforced the need and importance of daily weights, a low sodium diet, and fluid restriction (less than 2 L a day). Instructed to call the HF clinic if weight increases more than 3 lbs overnight or 5 lbs in a  week.  - F/U 2 months with ECHO to reassess LV function.  2. HTN - Stable. As above will increase coreg to 18.75 mg BID for LV dysfunction and not HTN 3. Tobacco - Continues to smoke an accessional cigarette here and there. Continued encouragement to quit completely.   Shawn Perkins B NP-C 8:51 AM

## 2013-02-05 ENCOUNTER — Encounter (HOSPITAL_COMMUNITY): Payer: Self-pay

## 2013-02-05 ENCOUNTER — Ambulatory Visit (HOSPITAL_COMMUNITY)
Admission: RE | Admit: 2013-02-05 | Discharge: 2013-02-05 | Disposition: A | Discharge status: Home / Self Care | Payer: BC Managed Care – PPO | Source: Ambulatory Visit | Attending: Internal Medicine | Admitting: Internal Medicine

## 2013-02-05 VITALS — BP 126/80 | HR 71 | Wt 226.4 lb

## 2013-02-05 DIAGNOSIS — I10 Essential (primary) hypertension: Secondary | ICD-10-CM

## 2013-02-05 DIAGNOSIS — I509 Heart failure, unspecified: Secondary | ICD-10-CM | POA: Insufficient documentation

## 2013-02-05 DIAGNOSIS — F17200 Nicotine dependence, unspecified, uncomplicated: Secondary | ICD-10-CM

## 2013-02-05 DIAGNOSIS — I5022 Chronic systolic (congestive) heart failure: Secondary | ICD-10-CM | POA: Insufficient documentation

## 2013-02-05 DIAGNOSIS — Z720 Tobacco use: Secondary | ICD-10-CM

## 2013-02-05 MED ORDER — CARVEDILOL 12.5 MG PO TABS: 18.7500 mg | ORAL_TABLET | Freq: Two times a day (BID) | ORAL | Status: DC

## 2013-02-05 NOTE — Patient Instructions (Signed)
Doing great!!!!  Have a wonderful Christmas and New Year.  Increase your Coreg to 18.75 mg ( 1 1/2 tablets) twice a day.  Call any issues.  Follow up 2 months with ECHO.  Do the following things EVERYDAY: 1) Weigh yourself in the morning before breakfast. Write it down and keep it in a log. 2) Take your medicines as prescribed 3) Eat low salt foods-Limit salt (sodium) to 2000 mg per day.  4) Stay as active as you can everyday 5) Limit all fluids for the day to less than 2 liters

## 2013-04-01 ENCOUNTER — Encounter (HOSPITAL_COMMUNITY): Payer: Self-pay

## 2013-04-01 ENCOUNTER — Ambulatory Visit (HOSPITAL_COMMUNITY)
Admission: RE | Admit: 2013-04-01 | Discharge: 2013-04-01 | Disposition: A | Discharge status: Home / Self Care | Payer: No Typology Code available for payment source | Source: Ambulatory Visit | Attending: Internal Medicine | Admitting: Internal Medicine

## 2013-04-01 ENCOUNTER — Encounter: Payer: Self-pay | Admitting: Licensed Clinical Social Worker

## 2013-04-01 VITALS — BP 152/95 | HR 72 | Ht 71.0 in | Wt 237.8 lb

## 2013-04-01 DIAGNOSIS — R011 Cardiac murmur, unspecified: Secondary | ICD-10-CM | POA: Insufficient documentation

## 2013-04-01 DIAGNOSIS — Z79899 Other long term (current) drug therapy: Secondary | ICD-10-CM | POA: Insufficient documentation

## 2013-04-01 DIAGNOSIS — Z720 Tobacco use: Secondary | ICD-10-CM

## 2013-04-01 DIAGNOSIS — I10 Essential (primary) hypertension: Secondary | ICD-10-CM | POA: Insufficient documentation

## 2013-04-01 DIAGNOSIS — I5022 Chronic systolic (congestive) heart failure: Secondary | ICD-10-CM | POA: Insufficient documentation

## 2013-04-01 DIAGNOSIS — Z91199 Patient's noncompliance with other medical treatment and regimen due to unspecified reason: Secondary | ICD-10-CM | POA: Insufficient documentation

## 2013-04-01 DIAGNOSIS — F17200 Nicotine dependence, unspecified, uncomplicated: Secondary | ICD-10-CM | POA: Insufficient documentation

## 2013-04-01 DIAGNOSIS — I509 Heart failure, unspecified: Secondary | ICD-10-CM | POA: Insufficient documentation

## 2013-04-01 DIAGNOSIS — Z9119 Patient's noncompliance with other medical treatment and regimen: Secondary | ICD-10-CM | POA: Insufficient documentation

## 2013-04-01 DIAGNOSIS — F1010 Alcohol abuse, uncomplicated: Secondary | ICD-10-CM

## 2013-04-01 LAB — BASIC METABOLIC PANEL
BUN: 23 mg/dL (ref 6–23)
CHLORIDE: 107 meq/L (ref 96–112)
CO2: 26 meq/L (ref 19–32)
CREATININE: 1.21 mg/dL (ref 0.50–1.35)
Calcium: 10 mg/dL (ref 8.4–10.5)
GFR calc non Af Amer: 65 mL/min — ABNORMAL LOW (ref >90)
GFR, EST AFRICAN AMERICAN: 75 mL/min — AB (ref >90)
Glucose, Bld: 105 mg/dL — ABNORMAL HIGH (ref 70–99)
Potassium: 5 mEq/L (ref 3.7–5.3)
Sodium: 146 mEq/L (ref 137–147)

## 2013-04-01 MED ORDER — CARVEDILOL 25 MG PO TABS: 25.0000 mg | ORAL_TABLET | Freq: Two times a day (BID) | ORAL | Status: DC

## 2013-04-01 MED ORDER — SPIRONOLACTONE 25 MG PO TABS: 12.5000 mg | ORAL_TABLET | Freq: Every day | ORAL | Status: DC

## 2013-04-01 NOTE — Patient Instructions (Signed)
Doing great, keep up the good work.  Stop taking your potassium.  Increase your coreg to 25 mg (2 tablets) in the am and 25 mg (2 tablets) in the pm. When you run out of pills your new prescription will be 25 mg tablets and then you will take 1 tablet in the am and 1 tablet in the pm.  Start taking spironolactone 12.5 (1/2 tablet) daily.   Get labs at [facility] next Friday, February 20th. ([address] 3rd floor) any time.  Follow up 3 months with ECHO.  Do the following things EVERYDAY: 1) Weigh yourself in the morning before breakfast. Write it down and keep it in a log. 2) Take your medicines as prescribed 3) Eat low salt foods-Limit salt (sodium) to 2000 mg per day.  4) Stay as active as you can everyday 5) Limit all fluids for the day to less than 2 liters 6)

## 2013-04-01 NOTE — Progress Notes (Signed)
Patient ID: Shawn Perkins, male   DOB: August 11, 1955, 58 y.o.   MRN: [id number]  PCP:   HPI: Shawn Perkins is a 58 year old African  American male with a h/o severe HTN, tobacco and ETOH use, and chronic systolic HF.   Cath (11/11/10) which showed normal cors. EF 20% RA 7, RV 51/4, PA 61/30, with a mean of 47, PCWP 30.  Cardiac output was 4.0.  He was diuresed aggressively and started on lisinopril, spiro, carvedilol and lasix. His discharge weight was 99.8 kg.   Spironolactone stopped due to a potassium 6.2, 2012    Admitted to [facility] 11/22/2012 -11/25/12 with acute/chronic systolic heart failure in setting of medication noncompliance x several months and severe HTN. HF medications restarted. Repeat ECHO revealed  EF back down to 35-40% from 55%.  D/C weight was 214 pounds.   02/06/2011 ECHO 55-60% 10/3/14ECHO  EF 35-40%   Follow up: Last visit increased coreg to 18.75 BID, which he tolerated. Doing well. Denies SOB, orthopnea, edema, or CP. Gaining weight, however he relates to he is eating more. Still working [employer] Weight at home 225-227 lbs. Still smoking occasional cigarette. Drank a few drinks during superbowl. Following low salt diet and drinking less than 2L a day.    Labs 12/02/12: K+ 5.1, creatinine 1.6 12/10/12: K+ 3.5, creatinine 0.6 12/23/12: K+ 4.4, creatinine 1.17  ROS: All systems negative except as listed in HPI, PMH and Problem List.  Past Medical History  Diagnosis Date  . Systolic heart failure     a. 11/09/10 echo EF 35%, b. 11/11/10 cath norm cors c. ECHO (11/2012) EF 35-50%  . Hypertension   . Rheumatic fever     as a child  . History of cocaine abuse     use to smoke crack cocaine, last use was more than 6 years ago  . Heart murmur   . Shortness of breath   . CHF (congestive heart failure)   . Umbilical hernia, incarcerated 11/2012    11/2012 exploratory lap with small bwl resection    Current Outpatient Prescriptions  Medication Sig Dispense Refill  .  carvedilol (COREG) 12.5 MG tablet Take 1.5 tablets (18.75 mg total) by mouth 2 (two) times daily with a meal.  90 tablet  3  . furosemide (LASIX) 40 MG tablet Take 40 mg by mouth 2 (two) times daily.      Marland Kitchen losartan (COZAAR) 100 MG tablet Take 0.5 tablets (50 mg total) by mouth daily.  30 tablet  6  . potassium chloride SA (K-DUR,KLOR-CON) 20 MEQ tablet Take 20 mEq by mouth 2 (two) times daily.       No current facility-administered medications for this encounter.   Filed Vitals:   04/01/13 0856  BP: 152/95  Pulse: 72  Height: _0  (1.803 m)  Weight: 237 lb 12.8 oz (107.865 kg)   PHYSICAL EXAM: General:  Well appearing. No resp difficulty HEENT: normal Neck: supple. JVP flat Carotids 2+ bilaterally; no bruits. No lymphadenopathy or thryomegaly appreciated. Cor: PMI normal. Regular rate & rhythm. No rubs, gallops or murmurs. Lungs: clear Abdomen: soft, nontender nondistended. No hepatosplenomegaly. No bruits or masses. Good bowel sounds. Extremities: no cyanosis, clubbing, rash, no edema Neuro: alert & orientedx3, cranial nerves grossly intact. Moves all 4 extremities w/o difficulty. Affect pleasant.   ASSESSMENT & PLAN:  1. Chronic Systolic Heart Failure:  NICM, EF 35-40% (11/2012) -  NYHA I symptoms and volume status stable. Continue lasix 40 mg BID. Will check BMET  today. -  Increase coreg to 25 mg BID, which is goal dose. Instructed to call if he notices any fatigue or dizziness. - Continue losartan 50 mg daily.  - Shawn Perkins was stopped in the past d/t K+ 5.1, but he was also on K+ 20 meq BID. Will stop potassium and try to restart spiro 12.5 mg daily. BMET today and in 7-10 days.   - Reinforced the need and importance of daily weights, a low sodium diet, and fluid restriction (less than 2 L a day). Instructed to call the HF clinic if weight increases more than 3 lbs overnight or 5 lbs in a week.  - Repeat ECHO in 3 months if stable, will move appointments out to 6-8 months 2.  HTN - SBP elevated 150. Patient reports taking medications today. Will increase coreg as above and start spiro 12.5 mg daily.  3. Tobacco - Continues to smoke an accessional cigarette (2-3 a day) Continued encouragement to quit completely.  4. ETOH - Reports had a few drinks for the Superbowl. Discussed trying to abstain completley from ETOH.   F/U 3 months with ECHO. SW Shawn Perkins met with patient to help establish PCP.  Shawn Perkins B NP-C 8:58 AM

## 2013-04-01 NOTE — Progress Notes (Signed)
CSW met with patient to assist with obtaining PCP provider. Patient reports he recently rec'd new insurance card from his employer but unsure about PCP. CSW and patient contacted insurance company and provider list will be mailed to patient. CSW reviewed process for selection of PCP and return call to insurance once selected. Patient verbalized understanding and will contact CSW if further needs arise. Raquel Sarna, [place]

## 2013-04-18 ENCOUNTER — Other Ambulatory Visit: Payer: Self-pay

## 2013-04-25 ENCOUNTER — Other Ambulatory Visit: Payer: No Typology Code available for payment source

## 2013-04-25 DIAGNOSIS — I10 Essential (primary) hypertension: Secondary | ICD-10-CM

## 2013-04-25 LAB — BASIC METABOLIC PANEL
BUN: 20 mg/dL (ref 6–23)
CALCIUM: 9.4 mg/dL (ref 8.4–10.5)
CO2: 28 meq/L (ref 19–32)
Chloride: 105 mEq/L (ref 96–112)
Creatinine, Ser: 1.2 mg/dL (ref 0.4–1.5)
GFR: 79.25 mL/min (ref >60.00)
GLUCOSE: 85 mg/dL (ref 70–99)
Potassium: 3.3 mEq/L — ABNORMAL LOW (ref 3.5–5.1)
Sodium: 141 mEq/L (ref 135–145)

## 2013-05-01 ENCOUNTER — Telehealth (HOSPITAL_COMMUNITY): Payer: Self-pay | Admitting: *Deleted

## 2013-05-01 DIAGNOSIS — I5022 Chronic systolic (congestive) heart failure: Secondary | ICD-10-CM

## 2013-05-01 MED ORDER — SPIRONOLACTONE 25 MG PO TABS: 25.0000 mg | ORAL_TABLET | Freq: Every day | ORAL | Status: DC

## 2013-05-01 MED ORDER — FUROSEMIDE 40 MG PO TABS: 40.0000 mg | ORAL_TABLET | Freq: Every day | ORAL | Status: DC

## 2013-05-01 NOTE — Telephone Encounter (Signed)
Repeat bmet 3/20

## 2013-05-01 NOTE — Telephone Encounter (Signed)
Message copied by Noralee Space on Thu May 01, 2013  3:45 PM ------      Message from: Noralee Space      Created: Mon Apr 28, 2013  3:57 PM       Per Tonye Becket, NP increase Cleda Daub to 25 mg daily and decrease Lasix to 40 mg daily ------

## 2013-05-09 ENCOUNTER — Other Ambulatory Visit (INDEPENDENT_AMBULATORY_CARE_PROVIDER_SITE_OTHER): Payer: No Typology Code available for payment source

## 2013-05-09 DIAGNOSIS — I5022 Chronic systolic (congestive) heart failure: Secondary | ICD-10-CM

## 2013-05-09 LAB — BASIC METABOLIC PANEL
BUN: 16 mg/dL (ref 6–23)
CALCIUM: 9 mg/dL (ref 8.4–10.5)
CO2: 26 mEq/L (ref 19–32)
Chloride: 108 mEq/L (ref 96–112)
Creatinine, Ser: 1.5 mg/dL (ref 0.4–1.5)
GFR: 64.31 mL/min (ref >60.00)
Glucose, Bld: 142 mg/dL — ABNORMAL HIGH (ref 70–99)
Potassium: 4.9 mEq/L (ref 3.5–5.1)
Sodium: 141 mEq/L (ref 135–145)

## 2013-05-28 ENCOUNTER — Telehealth (HOSPITAL_COMMUNITY): Payer: Self-pay | Admitting: *Deleted

## 2013-05-28 DIAGNOSIS — I5022 Chronic systolic (congestive) heart failure: Secondary | ICD-10-CM

## 2013-05-28 NOTE — Telephone Encounter (Signed)
Recheck on 4/10

## 2013-05-28 NOTE — Telephone Encounter (Signed)
Message copied by Noralee Space on Wed May 28, 2013 10:36 AM ------      Message from: Arvilla Meres R      Created: Mon May 19, 2013 12:32 PM       Cr up slightly. Recheck 2 weeks. ------

## 2013-05-30 ENCOUNTER — Other Ambulatory Visit (INDEPENDENT_AMBULATORY_CARE_PROVIDER_SITE_OTHER): Payer: No Typology Code available for payment source

## 2013-05-30 DIAGNOSIS — I5022 Chronic systolic (congestive) heart failure: Secondary | ICD-10-CM

## 2013-05-30 LAB — BASIC METABOLIC PANEL
BUN: 14 mg/dL (ref 6–23)
CO2: 27 mEq/L (ref 19–32)
CREATININE: 1 mg/dL (ref 0.4–1.5)
Calcium: 9.1 mg/dL (ref 8.4–10.5)
Chloride: 106 mEq/L (ref 96–112)
GFR: 94.35 mL/min (ref >60.00)
Glucose, Bld: 78 mg/dL (ref 70–99)
Potassium: 3.1 mEq/L — ABNORMAL LOW (ref 3.5–5.1)
Sodium: 140 mEq/L (ref 135–145)

## 2013-06-05 ENCOUNTER — Telehealth (HOSPITAL_COMMUNITY): Payer: Self-pay | Admitting: *Deleted

## 2013-06-05 MED ORDER — POTASSIUM CHLORIDE CRYS ER 20 MEQ PO TBCR: 20.0000 meq | EXTENDED_RELEASE_TABLET | Freq: Every day | ORAL | Status: DC

## 2013-06-05 NOTE — Telephone Encounter (Signed)
Message copied by Noralee Space on Thu Jun 05, 2013  5:30 PM ------      Message from: Dolores Patty      Created: Sat May 31, 2013  9:44 PM       Give kcl 80 meq then start 20 daily. Recheck 2 weeks. ------

## 2013-06-05 NOTE — Telephone Encounter (Signed)
Pt aware, recheck 4/29

## 2013-06-18 ENCOUNTER — Ambulatory Visit (HOSPITAL_BASED_OUTPATIENT_CLINIC_OR_DEPARTMENT_OTHER)
Admission: RE | Admit: 2013-06-18 | Discharge: 2013-06-18 | Disposition: A | Discharge status: Home / Self Care | Payer: No Typology Code available for payment source | Source: Ambulatory Visit | Attending: Internal Medicine | Admitting: Internal Medicine

## 2013-06-18 ENCOUNTER — Encounter (HOSPITAL_COMMUNITY): Payer: Self-pay

## 2013-06-18 ENCOUNTER — Ambulatory Visit (HOSPITAL_COMMUNITY)
Admission: RE | Admit: 2013-06-18 | Discharge: 2013-06-18 | Disposition: A | Discharge status: Home / Self Care | Payer: No Typology Code available for payment source | Source: Ambulatory Visit | Attending: Internal Medicine | Admitting: Internal Medicine

## 2013-06-18 VITALS — BP 102/70 | HR 74 | Wt 231.8 lb

## 2013-06-18 DIAGNOSIS — I359 Nonrheumatic aortic valve disorder, unspecified: Secondary | ICD-10-CM

## 2013-06-18 DIAGNOSIS — F17200 Nicotine dependence, unspecified, uncomplicated: Secondary | ICD-10-CM

## 2013-06-18 DIAGNOSIS — Z720 Tobacco use: Secondary | ICD-10-CM

## 2013-06-18 DIAGNOSIS — I10 Essential (primary) hypertension: Secondary | ICD-10-CM

## 2013-06-18 DIAGNOSIS — I5022 Chronic systolic (congestive) heart failure: Secondary | ICD-10-CM

## 2013-06-18 DIAGNOSIS — I509 Heart failure, unspecified: Secondary | ICD-10-CM | POA: Insufficient documentation

## 2013-06-18 LAB — BASIC METABOLIC PANEL
BUN: 23 mg/dL (ref 6–23)
CALCIUM: 9.8 mg/dL (ref 8.4–10.5)
CO2: 21 mEq/L (ref 19–32)
CREATININE: 1.31 mg/dL (ref 0.50–1.35)
Chloride: 99 mEq/L (ref 96–112)
GFR calc Af Amer: 68 mL/min — ABNORMAL LOW (ref >90)
GFR, EST NON AFRICAN AMERICAN: 58 mL/min — AB (ref >90)
Glucose, Bld: 113 mg/dL — ABNORMAL HIGH (ref 70–99)
Potassium: 4.3 mEq/L (ref 3.7–5.3)
SODIUM: 137 meq/L (ref 137–147)

## 2013-06-18 NOTE — Progress Notes (Signed)
  Echocardiogram 2D Echocardiogram has been performed.  Shawn Perkins 06/18/2013, 9:29 AM

## 2013-06-18 NOTE — Patient Instructions (Signed)
Lab today  We will contact you in 6 months to schedule your next appointment.  

## 2013-06-18 NOTE — Progress Notes (Signed)
Patient ID: Shawn Perkins, male   DOB: [birth year], 58 y.o.   MRN: [id number]  PCP:   HPI: Shawn Perkins is a 58 year old African  American male with a h/o severe HTN, tobacco and ETOH use, and chronic systolic HF.   Cath (11/11/10) which showed normal cors. EF 20% RA 7, RV 51/4, PA 61/30, with a mean of 47, PCWP 30.  Cardiac output was 4.0.  He was diuresed aggressively and started on lisinopril, spiro, carvedilol and lasix. His discharge weight was 99.8 kg.   Spironolactone stopped due to a potassium 6.2, 2012    Admitted to [facility] 11/22/2012 -11/25/12 with acute/chronic systolic heart failure in setting of medication noncompliance x several months and severe HTN. HF medications restarted. Repeat ECHO revealed  EF back down to 35-40% from 55%.  D/C weight was 214 pounds.   02/06/2011 ECHO 55-60% 11/22/12 ECHO  EF 35-40%  06/18/13 EF 45-50%   Follow up: Last visit increased coreg to 25 BID, which he tolerated. Doing well. Denies SOB, orthopnea, edema, or CP.  Weight down 5 pounds. Still working [employer]. Weight at home 225-227 lbs. Still smoking occasional cigarette (about 3 per day). Following low salt diet and drinking less than 2L a day.  Recently potassium stopped and recheck was low at 3.1. Has been restarted.   Labs 12/02/12: K+ 5.1, creatinine 1.6 12/10/12: K+ 3.5, creatinine 0.6 12/23/12: K+ 4.4, creatinine 1.17  ROS: All systems negative except as listed in HPI, PMH and Problem List.  Past Medical History  Diagnosis Date  . Systolic heart failure     a. 2/80/03 echo EF 35%, b. 11/11/10 cath norm cors c. ECHO (11/2012) EF 35-50%  . Hypertension   . Rheumatic fever     as a child  . History of cocaine abuse     use to smoke crack cocaine, last use was more than 58 years ago  . Heart murmur   . Shortness of breath   . CHF (congestive heart failure)   . Umbilical hernia, incarcerated 11/2012    11/2012 exploratory lap with small bwl resection    Current Outpatient Prescriptions   Medication Sig Dispense Refill  . carvedilol (COREG) 25 MG tablet Take 1 tablet (25 mg total) by mouth 2 (two) times daily with a meal.  60 tablet  3  . furosemide (LASIX) 40 MG tablet Take 1 tablet (40 mg total) by mouth daily.  30 tablet    . losartan (COZAAR) 100 MG tablet Take 0.5 tablets (50 mg total) by mouth daily.  30 tablet  6  . potassium chloride SA (K-DUR,KLOR-CON) 20 MEQ tablet Take 1 tablet (20 mEq total) by mouth daily.  30 tablet  3  . spironolactone (ALDACTONE) 25 MG tablet Take 1 tablet (25 mg total) by mouth daily.  30 tablet  6   No current facility-administered medications for this encounter.   Filed Vitals:   06/18/13 1003  BP: 102/70  Pulse: 74  Weight: 231 lb 12.8 oz (105.144 kg)  SpO2: 97%   PHYSICAL EXAM: General:  Well appearing. No resp difficulty HEENT: normal Neck: supple. JVP flat Carotids 2+ bilaterally; no bruits. No lymphadenopathy or thryomegaly appreciated. Cor: PMI normal. Regular rate & rhythm. No rubs, gallops or murmurs. Lungs: clear Abdomen: soft, nontender nondistended. No hepatosplenomegaly. No bruits or masses. Good bowel sounds. Extremities: no cyanosis, clubbing, rash, no edema Neuro: alert & orientedx3, cranial nerves grossly intact. Moves all 4 extremities w/o difficulty. Affect pleasant.   ASSESSMENT &  PLAN:  1. Chronic Systolic Heart Failure:  NICM, EF 35-40% (11/2012) -> 45-50% today -  NYHA I symptoms and volume status stable. Continue lasix 40 mg BID. Will check BMET today. -  BP low so will not titrate meds further 2. HTN - BP rechecked. SBP < 110. Will continue current regimen. 3. Tobacco use - Continues to smoke an accessional cigarette (2-3 a day) Continued encouragement to quit completely.  4. ETOH - Reports had a few drinks on occasion.  Discussed trying to abstain completley from ETOH.   Still looking for PCP. Will have him RTC in 6 months.   Dolores Pattyaniel R Giana Castner MD 10:23 AM

## 2013-06-18 NOTE — Addendum Note (Signed)
Encounter addended by: Noralee Space, RN on: 06/18/2013 10:39 AM<BR>     Documentation filed: Patient Instructions Section, Orders

## 2013-06-25 ENCOUNTER — Ambulatory Visit (INDEPENDENT_AMBULATORY_CARE_PROVIDER_SITE_OTHER): Payer: No Typology Code available for payment source | Admitting: Family Medicine

## 2013-06-25 ENCOUNTER — Encounter: Payer: Self-pay | Admitting: Family Medicine

## 2013-06-25 VITALS — BP 119/75 | HR 76 | Temp 98.6°F | Ht 71.0 in | Wt 233.4 lb

## 2013-06-25 DIAGNOSIS — M5412 Radiculopathy, cervical region: Secondary | ICD-10-CM

## 2013-06-25 DIAGNOSIS — I5022 Chronic systolic (congestive) heart failure: Secondary | ICD-10-CM

## 2013-06-25 DIAGNOSIS — I10 Essential (primary) hypertension: Secondary | ICD-10-CM

## 2013-06-25 DIAGNOSIS — I5023 Acute on chronic systolic (congestive) heart failure: Secondary | ICD-10-CM

## 2013-06-25 DIAGNOSIS — I509 Heart failure, unspecified: Secondary | ICD-10-CM

## 2013-06-25 LAB — POCT GLYCOSYLATED HEMOGLOBIN (HGB A1C): HEMOGLOBIN A1C: 5.8

## 2013-06-25 MED ORDER — IBUPROFEN 800 MG PO TABS: 800.0000 mg | ORAL_TABLET | Freq: Three times a day (TID) | ORAL | Status: DC | PRN

## 2013-06-25 MED ORDER — GABAPENTIN 300 MG PO CAPS
300.0000 mg | ORAL_CAPSULE | Freq: Every day | ORAL | Status: DC
Start: 2013-06-25 — End: 2014-05-27

## 2013-06-25 NOTE — Assessment & Plan Note (Signed)
Managed by CHF team, appreciate recommendations.

## 2013-06-25 NOTE — Progress Notes (Signed)
Shawn Perkins is a 58 y.o. who presents today for establishing care.    CHF - Stable, managed by Dr. Gala Romney at CHF clinic.  L upper extremity paresthesias - Ongoing now for 6-12 months, no previous injury.  Describing numbness and tingling in the two smallest fingers with occasional weakness.  Has not tried anything for this and has not gotten worse.  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  Does not have trouble with gripping coffee cup and is unilateral on the L side.  HTN - Well controlled, no SE from medications.   Social Hx - smokes 1/4 ppd, has smoked > 40 yrs, previous up to 1 ppd.  Drinks 4-6 drinks per week, sometimes more.  Marijuana use occasionally.   Past Medical History  Diagnosis Date  . Systolic heart failure     a. 6/83/41 echo EF 35%, b. 11/11/10 cath norm cors c. ECHO (11/2012) EF 35-50%  . Hypertension   . Rheumatic fever     as a child  . History of cocaine abuse     use to smoke crack cocaine, last use was more than 6 years ago  . Heart murmur   . Shortness of breath   . CHF (congestive heart failure)   . Umbilical hernia, incarcerated 11/2012    11/2012 exploratory lap with small bwl resection    History   Social History  . Marital Status: Single    Spouse Name: N/A    Number of Children: N/A  . Years of Education: N/A   Occupational History  . Not on file.   Social History Main Topics  . Smoking status: Current Some Day Smoker -- 0.12 packs/day for 42 years    Types: Cigarettes  . Smokeless tobacco: Never Used  . Alcohol Use: 2.4 oz/week    4 Cans of beer per week     Comment: 12/05/2012 "no more than 12 pack beer/week; haven't had anything in 3 wks"  . Drug Use: Yes    Special: Marijuana, "Crack" cocaine     Comment: 12/05/2012 use to smoke weed & crack cocaine; none in over 6 years ago"  . Sexual Activity: Not Currently   Other Topics Concern  . Not on file   Social History Narrative  . No narrative on file    Family  History  Problem Relation Age of Onset  . Heart disease Neg Hx   . Diabetes Neg Hx   . Hyperlipidemia Neg Hx   . COPD Neg Hx     Current Outpatient Prescriptions on File Prior to Visit  Medication Sig Dispense Refill  . carvedilol (COREG) 25 MG tablet Take 1 tablet (25 mg total) by mouth 2 (two) times daily with a meal.  60 tablet  3  . furosemide (LASIX) 40 MG tablet Take 1 tablet (40 mg total) by mouth daily.  30 tablet    . losartan (COZAAR) 100 MG tablet Take 0.5 tablets (50 mg total) by mouth daily.  30 tablet  6  . potassium chloride SA (K-DUR,KLOR-CON) 20 MEQ tablet Take 1 tablet (20 mEq total) by mouth daily.  30 tablet  3  . spironolactone (ALDACTONE) 25 MG tablet Take 1 tablet (25 mg total) by mouth daily.  30 tablet  6   No current facility-administered medications on file prior to visit.    Patient Information Form: Screening and ROS  AUDIT-C Score: 6 Do you feel safe in relationships? yes PHQ-2:negative  Review of Symptoms  General:  Negative for nexplained weight loss, fever Skin: Negative for new or changing mole, sore that won't heal HEENT: Negative for trouble hearing, trouble seeing, ringing in ears, mouth sores, hoarseness, change in voice, dysphagia. CV:  Negative for chest pain, dyspnea, edema, palpitations Resp: Negative for cough, dyspnea, hemoptysis GI: Negative for nausea, vomiting, diarrhea, constipation, abdominal pain, melena, hematochezia. GU: Negative for dysuria, incontinence, urinary hesitance, hematuria, vaginal or penile discharge, polyuria, sexual difficulty, lumps in testicle or breasts MSK: Negative for muscle cramps or aches, joint pain or swelling Neuro: Negative for headaches, weakness, + for numbness, neg dizziness, passing out/fainting Psych: Negative for depression, anxiety, memory problems  Physical Exam Filed Vitals:   06/25/13 1342  BP: 119/75  Pulse: 76  Temp: 98.6 F (37 C)    Gen: NAD, Well nourished, Well  developed HEENT: PERLA, EOMI, [place]/AT Neck: no JVD. Cardio: RRR, No murmurs/gallops/rubs Lungs: CTA, no wheezes, rhonchi, crackles Abd: NABS, soft nontender nondistended MSK: Cervical: No erythema/edema, no spinal midline TTP, ROM nml in all directions, + Spurling on the L, negative Adson's.  Neuro: CN 2-12 intact, MS 5/5 B/L UE and LE, specifically arm flexion/extension, hand flexion/extension, finger abduction and thumb abduction 5/5 B/L , +2 patellar and achilles relfex b/l, + 2/4 Biceps/Triceps  Psych: AAO x 3     Chemistry      Component Value Date/Time   NA 137 06/18/2013 1038   K 4.3 06/18/2013 1038   CL 99 06/18/2013 1038   CO2 21 06/18/2013 1038   BUN 23 06/18/2013 1038   CREATININE 1.31 06/18/2013 1038      Component Value Date/Time   CALCIUM 9.8 06/18/2013 1038   ALKPHOS 92 12/05/2012 0925   AST 15 12/05/2012 0925   ALT 15 12/05/2012 0925   BILITOT 0.3 12/05/2012 0925

## 2013-06-25 NOTE — Assessment & Plan Note (Signed)
Managed by CHF team, appreciate recs.

## 2013-06-25 NOTE — Assessment & Plan Note (Signed)
Ongoing now for several months, has not tried anything and is not worsening and denies specific weakness.  Will start with Cervical neck X-rays, however, may need to get MRI of cervical spine and possible EMG to evaluate for neuropathy.

## 2013-06-25 NOTE — Patient Instructions (Signed)
Nice seeing you today Mr. Edsall.  Please start taking the ibuprofen 800 mg per day and the neurontin.  I will see you back in 4 weeks.  Thanks, Dr. Paulina Fusi

## 2013-06-25 NOTE — Assessment & Plan Note (Signed)
Well-controlled, continue current regimen 

## 2013-06-26 LAB — HEPATIC FUNCTION PANEL
ALBUMIN: 4.6 g/dL (ref 3.5–5.2)
ALT: 11 U/L (ref 0–53)
AST: 15 U/L (ref 0–37)
Alkaline Phosphatase: 102 U/L (ref 39–117)
BILIRUBIN DIRECT: 0.1 mg/dL (ref 0.0–0.3)
BILIRUBIN TOTAL: 0.5 mg/dL (ref 0.2–1.2)
Indirect Bilirubin: 0.4 mg/dL (ref 0.2–1.2)
Total Protein: 7.6 g/dL (ref 6.0–8.3)

## 2013-06-26 LAB — LIPID PANEL
Cholesterol: 187 mg/dL (ref 0–200)
HDL: 45 mg/dL (ref >39)
LDL CALC: 96 mg/dL (ref 0–99)
Total CHOL/HDL Ratio: 4.2 Ratio
Triglycerides: 229 mg/dL — ABNORMAL HIGH (ref <150)
VLDL: 46 mg/dL — ABNORMAL HIGH (ref 0–40)

## 2013-06-27 ENCOUNTER — Ambulatory Visit
Admission: RE | Admit: 2013-06-27 | Discharge: 2013-06-27 | Disposition: A | Discharge status: Home / Self Care | Payer: No Typology Code available for payment source | Source: Ambulatory Visit | Attending: Family Medicine | Admitting: Family Medicine

## 2013-06-27 DIAGNOSIS — M5412 Radiculopathy, cervical region: Secondary | ICD-10-CM

## 2013-07-30 ENCOUNTER — Ambulatory Visit: Payer: No Typology Code available for payment source | Admitting: Family Medicine

## 2013-08-13 ENCOUNTER — Encounter: Payer: Self-pay | Admitting: Family Medicine

## 2013-08-13 ENCOUNTER — Ambulatory Visit (INDEPENDENT_AMBULATORY_CARE_PROVIDER_SITE_OTHER): Payer: No Typology Code available for payment source | Admitting: Family Medicine

## 2013-08-13 VITALS — BP 143/93 | HR 76 | Temp 98.2°F | Wt 234.0 lb

## 2013-08-13 DIAGNOSIS — Z1211 Encounter for screening for malignant neoplasm of colon: Secondary | ICD-10-CM | POA: Insufficient documentation

## 2013-08-13 DIAGNOSIS — I5022 Chronic systolic (congestive) heart failure: Secondary | ICD-10-CM

## 2013-08-13 DIAGNOSIS — I10 Essential (primary) hypertension: Secondary | ICD-10-CM

## 2013-08-13 DIAGNOSIS — M5412 Radiculopathy, cervical region: Secondary | ICD-10-CM

## 2013-08-13 DIAGNOSIS — Z23 Encounter for immunization: Secondary | ICD-10-CM

## 2013-08-13 NOTE — Assessment & Plan Note (Addendum)
Well controlled, compliant with medications, denies SE. Creatinine/K+ stable from April 2015.  Consider repeat at next office visit.

## 2013-08-13 NOTE — Assessment & Plan Note (Addendum)
Recommend having colonoscopy performed, information given, and will continue to evaluate if does not have completed by next visit.

## 2013-08-13 NOTE — Patient Instructions (Signed)
Arthritis, Nonspecific °Arthritis is inflammation of a joint. This usually means pain, redness, warmth or swelling are present. One or more joints may be involved. There are a number of types of arthritis. Your caregiver may not be able to tell what type of arthritis you have right away. °CAUSES  °The most common cause of arthritis is the wear and tear on the joint (osteoarthritis). This causes damage to the cartilage, which can break down over time. The knees, hips, back and neck are most often affected by this type of arthritis. °Other types of arthritis and common causes of joint pain include: °· Sprains and other injuries near the joint. Sometimes minor sprains and injuries cause pain and swelling that develop hours later. °· Rheumatoid arthritis. This affects hands, feet and knees. It usually affects both sides of your body at the same time. It is often associated with chronic ailments, fever, weight loss and general weakness. °· Crystal arthritis. Gout and pseudo gout can cause occasional acute severe pain, redness and swelling in the foot, ankle, or knee. °· Infectious arthritis. Bacteria can get into a joint through a break in overlying skin. This can cause infection of the joint. Bacteria and viruses can also spread through the blood and affect your joints. °· Drug, infectious and allergy reactions. Sometimes joints can become mildly painful and slightly swollen with these types of illnesses. °SYMPTOMS  °· Pain is the main symptom. °· Your joint or joints can also be red, swollen and warm or hot to the touch. °· You may have a fever with certain types of arthritis, or even feel overall ill. °· The joint with arthritis will hurt with movement. Stiffness is present with some types of arthritis. °DIAGNOSIS  °Your caregiver will suspect arthritis based on your description of your symptoms and on your exam. Testing may be needed to find the type of arthritis: °· Blood and sometimes urine tests. °· X-ray tests  and sometimes CT or MRI scans. °· Removal of fluid from the joint (arthrocentesis) is done to check for bacteria, crystals or other causes. Your caregiver (or a specialist) will numb the area over the joint with a local anesthetic, and use a needle to remove joint fluid for examination. This procedure is only minimally uncomfortable. °· Even with these tests, your caregiver may not be able to tell what kind of arthritis you have. Consultation with a specialist (rheumatologist) may be helpful. °TREATMENT  °Your caregiver will discuss with you treatment specific to your type of arthritis. If the specific type cannot be determined, then the following general recommendations may apply. °Treatment of severe joint pain includes: °· Rest. °· Elevation. °· Anti-inflammatory medication (for example, ibuprofen) may be prescribed. Avoiding activities that cause increased pain. °· Only take over-the-counter or prescription medicines for pain and discomfort as recommended by your caregiver. °· Cold packs over an inflamed joint may be used for 10 to 15 minutes every hour. Hot packs sometimes feel better, but do not use overnight. Do not use hot packs if you are diabetic without your caregiver's permission. °· A cortisone shot into arthritic joints may help reduce pain and swelling. °· Any acute arthritis that gets worse over the next 1 to 2 days needs to be looked at to be sure there is no joint infection. °Long-term arthritis treatment involves modifying activities and lifestyle to reduce joint stress jarring. This can include weight loss. Also, exercise is needed to nourish the joint cartilage and remove waste. This helps keep the muscles   around the joint strong. °HOME CARE INSTRUCTIONS  °· Do not take aspirin to relieve pain if gout is suspected. This elevates uric acid levels. °· Only take over-the-counter or prescription medicines for pain, discomfort or fever as directed by your caregiver. °· Rest the joint as much as  possible. °· If your joint is swollen, keep it elevated. °· Use crutches if the painful joint is in your leg. °· Drinking plenty of fluids may help for certain types of arthritis. °· Follow your caregiver's dietary instructions. °· Try low-impact exercise such as: °¨ Swimming. °¨ Water aerobics. °¨ Biking. °¨ Walking. °· Morning stiffness is often relieved by a warm shower. °· Put your joints through regular range-of-motion. °SEEK MEDICAL CARE IF:  °· You do not feel better in 24 hours or are getting worse. °· You have side effects to medications, or are not getting better with treatment. °SEEK IMMEDIATE MEDICAL CARE IF:  °· You have a fever. °· You develop severe joint pain, swelling or redness. °· Many joints are involved and become painful and swollen. °· There is severe back pain and/or leg weakness. °· You have loss of bowel or bladder control. °Document Released: 03/16/2004 Document Revised: 05/01/2011 Document Reviewed: 04/01/2008 °Exit[employer]® Patient Information ©2015 [employer]. This information is not intended to replace advice given to you by your health care provider. Make sure you discuss any questions you have with your health care provider. ° °

## 2013-08-13 NOTE — Assessment & Plan Note (Signed)
Cervical neck films reviewed with pt today showing spondylosis and DJD of C6/C7/C8 with some joint space narrowing at that area.  Sx have greatly improved with neck exercises and neurontin 300 mg qhs.  Continue with current plan and if need to increase neurontin to 300 mg BID, recommend doing this.  If worsening, would consider MRI of cervical spine.  F/U in three months.

## 2013-08-13 NOTE — Assessment & Plan Note (Signed)
Stable weight, continue current meds.

## 2013-08-13 NOTE — Progress Notes (Signed)
Shawn Perkins is a 58 y.o. who presents today for f/u for L arm paresthesias, CHF, tobacco cessation and health maintenance.   CHF - Stable, managed by Dr. Gala Romney at CHF clinic.  L upper extremity paresthesias - Ongoing now for 6-12 months, no previous injury.  Described numbness and tingling in the two smallest fingers with occasional weakness. Has been on Neurontin 300 mg qhs which has greatly improved his Sx so that he only gets Sx 1-2 x per week instead of daily.   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  Does not have trouble with gripping coffee cup and is unilateral on the L side.  Denies neck pain, neck injury, midline cervical tenderness, rheumatologic history.    HTN - Well controlled, no SE from medications.   Social Hx - smokes 1/4 ppd, has smoked > 40 yrs, previous up to 1 ppd.  Drinks 4-6 drinks per week, sometimes more.  Marijuana use occasionally.   Medical Maintenance - Has not had Tdap in over 10 yrs and has never had colonoscopy.  Denies family hx of colon CA.   Past Medical History  Diagnosis Date  . Systolic heart failure     a. 11/09/08 echo EF 35%, b. 11/11/10 cath norm cors c. ECHO (11/2012) EF 35-50%  . Hypertension   . Rheumatic fever     as a child  . History of cocaine abuse     use to smoke crack cocaine, last use was more than 6 years ago  . Heart murmur   . Shortness of breath   . CHF (congestive heart failure)   . Umbilical hernia, incarcerated 11/2012    11/2012 exploratory lap with small bwl resection    History   Social History  . Marital Status: Single    Spouse Name: N/A    Number of Children: N/A  . Years of Education: N/A   Occupational History  . Not on file.   Social History Main Topics  . Smoking status: Current Some Day Smoker -- 0.12 packs/day for 42 years    Types: Cigarettes  . Smokeless tobacco: Never Used  . Alcohol Use: 2.4 oz/week    4 Cans of beer per week     Comment: 12/05/2012 "no more than 12 pack  beer/week; haven't had anything in 3 wks"  . Drug Use: Yes    Special: Marijuana, "Crack" cocaine     Comment: 12/05/2012 use to smoke weed & crack cocaine; none in over 6 years ago"  . Sexual Activity: Not Currently   Other Topics Concern  . Not on file   Social History Narrative  . No narrative on file    Family History  Problem Relation Age of Onset  . Heart disease Neg Hx   . Diabetes Neg Hx   . Hyperlipidemia Neg Hx   . COPD Neg Hx     Current Outpatient Prescriptions on File Prior to Visit  Medication Sig Dispense Refill  . carvedilol (COREG) 25 MG tablet Take 1 tablet (25 mg total) by mouth 2 (two) times daily with a meal.  60 tablet  3  . furosemide (LASIX) 40 MG tablet Take 1 tablet (40 mg total) by mouth daily.  30 tablet    . gabapentin (NEURONTIN) 300 MG capsule Take 1 capsule (300 mg total) by mouth at bedtime.  60 capsule  1  . ibuprofen (ADVIL,MOTRIN) 800 MG tablet Take 1 tablet (800 mg total) by mouth every 8 (eight) hours as  needed.  90 tablet  1  . losartan (COZAAR) 100 MG tablet Take 0.5 tablets (50 mg total) by mouth daily.  30 tablet  6  . potassium chloride SA (K-DUR,KLOR-CON) 20 MEQ tablet Take 1 tablet (20 mEq total) by mouth daily.  30 tablet  3  . spironolactone (ALDACTONE) 25 MG tablet Take 1 tablet (25 mg total) by mouth daily.  30 tablet  6   No current facility-administered medications on file prior to visit.   Physical Exam Filed Vitals:   08/13/13 1335  BP: 143/93  Pulse: 76  Temp: 98.2 F (36.8 C)    Gen: NAD, Well nourished, Well developed HEENT: PERLA, EOMI, [place]/AT Neck: no JVD. Cardio: RRR, + 1/6 RUSB murmur  Lungs: CTA, no wheezes, rhonchi, crackles MSK: Cervical: No erythema/edema, no spinal midline TTP, ROM nml in all directions, + Spurling on the L, negative Adson's.  MS 5/5 B/L UE, sensation intact, neurovascular intact distally B/L UE      Chemistry      Component Value Date/Time   NA 137 06/18/2013 1038   K 4.3 06/18/2013  1038   CL 99 06/18/2013 1038   CO2 21 06/18/2013 1038   BUN 23 06/18/2013 1038   CREATININE 1.31 06/18/2013 1038      Component Value Date/Time   CALCIUM 9.8 06/18/2013 1038   ALKPHOS 102 06/25/2013 1438   AST 15 06/25/2013 1438   ALT 11 06/25/2013 1438   BILITOT 0.5 06/25/2013 1438      Lab Results  Component Value Date   HGBA1C 5.8 06/25/2013    Lab Results  Component Value Date   ALT 11 06/25/2013   AST 15 06/25/2013   ALKPHOS 102 06/25/2013   BILITOT 0.5 06/25/2013

## 2013-08-21 ENCOUNTER — Other Ambulatory Visit (HOSPITAL_COMMUNITY): Payer: Self-pay

## 2013-08-21 MED ORDER — CARVEDILOL 25 MG PO TABS: 25.0000 mg | ORAL_TABLET | Freq: Two times a day (BID) | ORAL | Status: DC

## 2013-11-28 ENCOUNTER — Other Ambulatory Visit (HOSPITAL_COMMUNITY): Payer: Self-pay | Admitting: Adult Health

## 2013-12-11 ENCOUNTER — Other Ambulatory Visit (HOSPITAL_COMMUNITY): Payer: Self-pay | Admitting: Adult Health

## 2013-12-19 ENCOUNTER — Other Ambulatory Visit (HOSPITAL_COMMUNITY): Payer: Self-pay | Admitting: Internal Medicine

## 2013-12-19 DIAGNOSIS — I5022 Chronic systolic (congestive) heart failure: Secondary | ICD-10-CM

## 2014-01-05 ENCOUNTER — Other Ambulatory Visit (HOSPITAL_COMMUNITY): Payer: Self-pay | Admitting: Cardiology

## 2014-01-05 MED ORDER — POTASSIUM CHLORIDE CRYS ER 20 MEQ PO TBCR: 20.0000 meq | EXTENDED_RELEASE_TABLET | Freq: Every day | ORAL | Status: DC

## 2014-04-10 ENCOUNTER — Other Ambulatory Visit (HOSPITAL_COMMUNITY): Payer: Self-pay | Admitting: Adult Health

## 2014-04-22 ENCOUNTER — Other Ambulatory Visit (HOSPITAL_COMMUNITY): Payer: Self-pay

## 2014-04-22 DIAGNOSIS — I5022 Chronic systolic (congestive) heart failure: Secondary | ICD-10-CM

## 2014-04-22 MED ORDER — CARVEDILOL 25 MG PO TABS: 25.0000 mg | ORAL_TABLET | Freq: Two times a day (BID) | ORAL | Status: DC

## 2014-05-27 ENCOUNTER — Ambulatory Visit (HOSPITAL_COMMUNITY)
Admission: RE | Admit: 2014-05-27 | Discharge: 2014-05-27 | Disposition: A | Discharge status: Home / Self Care | Payer: 59 | Source: Ambulatory Visit | Attending: Cardiology | Admitting: Cardiology

## 2014-05-27 VITALS — BP 112/72 | HR 72 | Wt 249.2 lb

## 2014-05-27 DIAGNOSIS — Z79899 Other long term (current) drug therapy: Secondary | ICD-10-CM | POA: Insufficient documentation

## 2014-05-27 DIAGNOSIS — Z9114 Patient's other noncompliance with medication regimen: Secondary | ICD-10-CM | POA: Diagnosis not present

## 2014-05-27 DIAGNOSIS — I10 Essential (primary) hypertension: Secondary | ICD-10-CM | POA: Insufficient documentation

## 2014-05-27 DIAGNOSIS — Z720 Tobacco use: Secondary | ICD-10-CM | POA: Diagnosis not present

## 2014-05-27 DIAGNOSIS — I5022 Chronic systolic (congestive) heart failure: Secondary | ICD-10-CM | POA: Diagnosis present

## 2014-05-27 DIAGNOSIS — F1010 Alcohol abuse, uncomplicated: Secondary | ICD-10-CM

## 2014-05-27 DIAGNOSIS — F17210 Nicotine dependence, cigarettes, uncomplicated: Secondary | ICD-10-CM | POA: Insufficient documentation

## 2014-05-27 LAB — BASIC METABOLIC PANEL
Anion gap: 10 (ref 5–15)
BUN: 17 mg/dL (ref 6–23)
CHLORIDE: 109 mmol/L (ref 96–112)
CO2: 20 mmol/L (ref 19–32)
Calcium: 10.3 mg/dL (ref 8.4–10.5)
Creatinine, Ser: 1.11 mg/dL (ref 0.50–1.35)
GFR calc Af Amer: 83 mL/min — ABNORMAL LOW (ref >90)
GFR, EST NON AFRICAN AMERICAN: 71 mL/min — AB (ref >90)
GLUCOSE: 98 mg/dL (ref 70–99)
POTASSIUM: 5.1 mmol/L (ref 3.5–5.1)
Sodium: 139 mmol/L (ref 135–145)

## 2014-05-27 NOTE — Progress Notes (Signed)
Patient ID: Shawn Perkins, male   DOB: 07-12-1955, 59 y.o.   MRN: [id number]  PCP: None   HPI: Shawn Perkins is a 58 year old African  American male with a h/o severe HTN, tobacco and ETOH use,  NICM, chronic systolic HF.   Cath (11/11/10) which showed normal cors. EF 20% RA 7, RV 51/4, PA 61/30, with a mean of 47, PCWP 30.  Cardiac output was 4.0.  He was diuresed aggressively and started on lisinopril, spiro, carvedilol and lasix. His discharge weight was 99.8 kg.   Admitted to [facility] 11/22/2012 -11/25/12 with acute/chronic systolic heart failure in setting of medication noncompliance x several months and severe HTN. HF medications restarted. Repeat ECHO revealed  EF back down to 35-40% from 55%.  D/C weight was 214 pounds.   He returns for HF follow up. Denies SOB/PND/Orthopnea. Not walking because he has  a car and he is able to drive to work. His job requires lots of walking. Still working 2 jobs. Not really weighing at home .  Still smoking occasional cigarette (about 3 per day). Drinking a beer or two on the weekend. Taking all medications.    02/06/2011 ECHO 55-60% 11/22/12 ECHO  EF 35-40%  06/18/13 EF 50-55%   Labs 12/02/12: K+ 5.1, creatinine 1.6 12/10/12: K+ 3.5, creatinine 0.6 12/23/12: K+ 4.4, creatinine 1.17  ROS: All systems negative except as listed in HPI, PMH and Problem List.  Past Medical History  Diagnosis Date  . Systolic heart failure     a. 05/30/79 echo EF 35%, b. 11/11/10 cath norm cors c. ECHO (11/2012) EF 35-50%  . Hypertension   . Rheumatic fever     as a child  . History of cocaine abuse     use to smoke crack cocaine, last use was more than 6 years ago  . Heart murmur   . Shortness of breath   . CHF (congestive heart failure)   . Umbilical hernia, incarcerated 11/2012    11/2012 exploratory lap with small bwl resection    Current Outpatient Prescriptions  Medication Sig Dispense Refill  . carvedilol (COREG) 25 MG tablet Take 1 tablet (25 mg total) by mouth 2  (two) times daily with a meal. Call clinic for further refills. 60 tablet 3  . furosemide (LASIX) 40 MG tablet Take 1 tablet (40 mg total) by mouth daily. 30 tablet   . losartan (COZAAR) 100 MG tablet TAKE ONE-HALF TABLET BY MOUTH ONCE DAILY 45 tablet 3  . potassium chloride SA (K-DUR,KLOR-CON) 20 MEQ tablet Take 1 tablet (20 mEq total) by mouth daily. 30 tablet 3  . spironolactone (ALDACTONE) 25 MG tablet TAKE ONE (1) TABLET BY MOUTH EVERY DAY 90 tablet 3   No current facility-administered medications for this encounter.   Filed Vitals:   05/27/14 0852  BP: 112/72  Pulse: 72  Weight: 249 lb 4 oz (113.059 kg)  SpO2: 99%   PHYSICAL EXAM: General:  Well appearing. No resp difficulty HEENT: normal Neck: supple. JVP flat Carotids 2+ bilaterally; no bruits. No lymphadenopathy or thryomegaly appreciated. Cor: PMI normal. Regular rate & rhythm. No rubs, gallops or murmurs. Lungs: clear Abdomen: soft, nontender nondistended. No hepatosplenomegaly. No bruits or masses. Good bowel sounds. Extremities: no cyanosis, clubbing, rash, no edema Neuro: alert & orientedx3, cranial nerves grossly intact. Moves all 4 extremities w/o difficulty. Affect pleasant.  EKG: NSR 71 bpm   ASSESSMENT & PLAN:  1. Chronic Systolic Heart Failure:  NICM, EF 05/2013 50-55%  -  NYHA  I symptoms and volume status stable despite weight gain. Weight gain likely due to inactivity. Continue lasix 40 mg daily and 25 mg spironolactone daily.  - On goal dose carvedilol - Continue losartan 50 mg daily - Check BMET today.  2. HTN - Stable. Continue current regimen. 3. Tobacco use- Continues to smoke an accessional cigarette (2-3 a day) Continued stop declines smoking cessation.   4. ETOH- Reports had a few drinks on occasion.  Discussed trying to abstain completley from ETOH.   Follow up 6 months with an ECHO. Check BMET today.  Shawn Ridley NP-C  9:23 AM

## 2014-05-27 NOTE — Patient Instructions (Signed)
Lab today  We will contact you in 6 months to schedule your next appointment and echocardiogram  

## 2014-05-28 NOTE — Addendum Note (Signed)
Encounter addended by: Barb Merino on: 05/28/2014  8:12 AM<BR>     Documentation filed: Charges VN

## 2014-06-10 ENCOUNTER — Other Ambulatory Visit (HOSPITAL_COMMUNITY): Payer: Self-pay | Admitting: Internal Medicine

## 2014-08-10 ENCOUNTER — Other Ambulatory Visit (HOSPITAL_COMMUNITY): Payer: Self-pay | Admitting: Internal Medicine

## 2014-08-25 ENCOUNTER — Other Ambulatory Visit (HOSPITAL_COMMUNITY): Payer: Self-pay | Admitting: Internal Medicine

## 2014-10-09 ENCOUNTER — Other Ambulatory Visit (HOSPITAL_COMMUNITY): Payer: Self-pay | Admitting: Internal Medicine

## 2014-11-06 ENCOUNTER — Other Ambulatory Visit (HOSPITAL_COMMUNITY): Payer: Self-pay | Admitting: Internal Medicine

## 2014-11-24 ENCOUNTER — Other Ambulatory Visit (HOSPITAL_COMMUNITY): Payer: Self-pay | Admitting: Adult Health

## 2014-12-24 ENCOUNTER — Other Ambulatory Visit: Payer: Self-pay | Admitting: Internal Medicine

## 2015-03-08 ENCOUNTER — Other Ambulatory Visit (HOSPITAL_COMMUNITY): Payer: Self-pay | Admitting: Internal Medicine

## 2015-04-23 ENCOUNTER — Other Ambulatory Visit: Payer: Self-pay | Admitting: Internal Medicine

## 2015-07-05 ENCOUNTER — Other Ambulatory Visit (HOSPITAL_COMMUNITY): Payer: Self-pay | Admitting: Internal Medicine

## 2015-11-03 ENCOUNTER — Other Ambulatory Visit: Payer: Self-pay | Admitting: Internal Medicine

## 2015-11-18 ENCOUNTER — Other Ambulatory Visit (HOSPITAL_COMMUNITY): Payer: Self-pay | Admitting: Adult Health

## 2015-11-18 ENCOUNTER — Other Ambulatory Visit: Payer: Self-pay | Admitting: Internal Medicine

## 2015-12-17 ENCOUNTER — Other Ambulatory Visit (HOSPITAL_COMMUNITY): Payer: Self-pay | Admitting: Adult Health

## 2016-03-02 ENCOUNTER — Other Ambulatory Visit: Payer: Self-pay | Admitting: Internal Medicine

## 2016-03-17 ENCOUNTER — Other Ambulatory Visit: Payer: Self-pay | Admitting: Internal Medicine

## 2016-05-16 ENCOUNTER — Other Ambulatory Visit (HOSPITAL_COMMUNITY): Payer: Self-pay | Admitting: Internal Medicine

## 2016-05-17 ENCOUNTER — Other Ambulatory Visit (HOSPITAL_COMMUNITY): Payer: Self-pay | Admitting: *Deleted

## 2016-05-17 MED ORDER — LOSARTAN POTASSIUM 100 MG PO TABS: 50.0000 mg | ORAL_TABLET | Freq: Every day | ORAL | 1 refills | Status: DC

## 2016-06-23 ENCOUNTER — Ambulatory Visit (HOSPITAL_BASED_OUTPATIENT_CLINIC_OR_DEPARTMENT_OTHER)
Admission: RE | Admit: 2016-06-23 | Discharge: 2016-06-23 | Disposition: A | Discharge status: Home / Self Care | Payer: BLUE CROSS/BLUE SHIELD | Source: Ambulatory Visit | Attending: Cardiology | Admitting: Cardiology

## 2016-06-23 ENCOUNTER — Ambulatory Visit (HOSPITAL_COMMUNITY)
Admission: RE | Admit: 2016-06-23 | Discharge: 2016-06-23 | Disposition: A | Discharge status: Home / Self Care | Payer: BLUE CROSS/BLUE SHIELD | Source: Ambulatory Visit | Attending: Internal Medicine | Admitting: Internal Medicine

## 2016-06-23 VITALS — BP 116/76 | HR 80 | Wt 250.0 lb

## 2016-06-23 DIAGNOSIS — I10 Essential (primary) hypertension: Secondary | ICD-10-CM | POA: Diagnosis not present

## 2016-06-23 DIAGNOSIS — Z79899 Other long term (current) drug therapy: Secondary | ICD-10-CM | POA: Diagnosis not present

## 2016-06-23 DIAGNOSIS — I428 Other cardiomyopathies: Secondary | ICD-10-CM | POA: Insufficient documentation

## 2016-06-23 DIAGNOSIS — F17210 Nicotine dependence, cigarettes, uncomplicated: Secondary | ICD-10-CM | POA: Diagnosis not present

## 2016-06-23 DIAGNOSIS — I5022 Chronic systolic (congestive) heart failure: Secondary | ICD-10-CM

## 2016-06-23 DIAGNOSIS — I110 Hypertensive heart disease with heart failure: Secondary | ICD-10-CM | POA: Diagnosis not present

## 2016-06-23 LAB — LIPID PANEL
Cholesterol: 149 mg/dL (ref 0–200)
HDL: 33 mg/dL — ABNORMAL LOW (ref >40)
LDL CALC: 78 mg/dL (ref 0–99)
TRIGLYCERIDES: 188 mg/dL — AB (ref <150)
Total CHOL/HDL Ratio: 4.5 RATIO
VLDL: 38 mg/dL (ref 0–40)

## 2016-06-23 LAB — CBC
HEMATOCRIT: 41.2 % (ref 39.0–52.0)
HEMOGLOBIN: 13.8 g/dL (ref 13.0–17.0)
MCH: 34.2 pg — ABNORMAL HIGH (ref 26.0–34.0)
MCHC: 33.5 g/dL (ref 30.0–36.0)
MCV: 102.2 fL — AB (ref 78.0–100.0)
Platelets: 182 10*3/uL (ref 150–400)
RBC: 4.03 MIL/uL — ABNORMAL LOW (ref 4.22–5.81)
RDW: 13.6 % (ref 11.5–15.5)
WBC: 9.6 10*3/uL (ref 4.0–10.5)

## 2016-06-23 LAB — BASIC METABOLIC PANEL
ANION GAP: 11 (ref 5–15)
BUN: 13 mg/dL (ref 6–20)
CHLORIDE: 109 mmol/L (ref 101–111)
CO2: 22 mmol/L (ref 22–32)
Calcium: 9.6 mg/dL (ref 8.9–10.3)
Creatinine, Ser: 1.04 mg/dL (ref 0.61–1.24)
GFR calc Af Amer: >60 mL/min (ref >60)
GLUCOSE: 103 mg/dL — AB (ref 65–99)
POTASSIUM: 4.3 mmol/L (ref 3.5–5.1)
Sodium: 142 mmol/L (ref 135–145)

## 2016-06-23 NOTE — Patient Instructions (Signed)
Labs today (will call for abnormal results, otherwise no news is good news)  Lab Work in 3 months (BMET)  Follow up in 6 Months.  We will call you to schedule your appointment.  If you have not heard from [place] 1 month prior then please call us to schedule it.

## 2016-06-23 NOTE — Progress Notes (Signed)
  Echocardiogram 2D Echocardiogram has been performed.  Shawn Perkins 06/23/2016, 10:47 AM

## 2016-06-24 NOTE — Progress Notes (Signed)
Patient ID: Shawn Perkins, male   DOB: [birth year], 61 y.o.   MRN: [id number]  PCP: None   HPI: Shawn Perkins is a 61 year old African  American male with a h/o severe HTN, tobacco and ETOH use,  NICM, chronic systolic HF.   Cath (11/11/10) which showed normal cors. EF 20% RA 7, RV 51/4, PA 61/30, with a mean of 47, PCWP 30.  Cardiac output was 4.0.  He was diuresed aggressively and started on lisinopril, spiro, carvedilol and lasix. His discharge weight was 99.8 kg.   Admitted to [facility] 11/22/2012 -11/25/12 with acute/chronic systolic heart failure in setting of medication noncompliance x several months and severe HTN. HF medications restarted. Repeat ECHO revealed  EF back down to 35-40% from 55%.  D/C weight was 214 pounds.   He returns for HF follow up. Echo was done today and reviewed, showing EF 55-60%, moderate LVH, normal RV size and systolic function.  Doing well overall.  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  No dyspnea walking on flat ground.  No orthopnea/PND.  Mild dyspnea walking up a flight of stairs.  He rarely smokes a cigarette and rarely drinks a beer.   02/06/2011 ECHO 55-60% 11/22/12 ECHO  EF 35-40%  06/18/13 ECHO EF 50-55%  5/18 Echo: EF 55-60%, moderate LVH, normal RV size and systolic function.  ECG (personally reviewed): NSR, normal  Labs 12/02/12: K+ 5.1, creatinine 1.6 12/10/12: K+ 3.5, creatinine 0.6 12/23/12: K+ 4.4, creatinine 1.17  ROS: All systems negative except as listed in HPI, PMH and Problem List.  Past Medical History:  Diagnosis Date  . CHF (congestive heart failure)   . Heart murmur   . History of cocaine abuse    use to smoke crack cocaine, last use was more than 6 years ago  . Hypertension   . Rheumatic fever    as a child  . Shortness of breath   . Systolic heart failure    a. 11/09/10 echo EF 35%, b. 11/11/10 cath norm cors c. ECHO (11/2012) EF 35-50%  . Umbilical hernia, incarcerated 11/2012   11/2012  exploratory lap with small bwl resection    Current Outpatient Prescriptions  Medication Sig Dispense Refill  . carvedilol (COREG) 25 MG tablet TAKE ONE (1) TABLET BY MOUTH TWO (2) TIMES DAILY WITH MEALS 60 tablet 3  . furosemide (LASIX) 40 MG tablet Take 1 tablet (40 mg total) by mouth daily. 30 tablet   . losartan (COZAAR) 100 MG tablet Take 0.5 tablets (50 mg total) by mouth daily. 45 tablet 1  . potassium chloride SA (K-DUR,KLOR-CON) 20 MEQ tablet TAKE ONE (1) TABLET BY MOUTH EVERY DAY 30 tablet 3  . spironolactone (ALDACTONE) 25 MG tablet TAKE ONE (1) TABLET BY MOUTH EVERY DAY 90 tablet 3   No current facility-administered medications for this encounter.    Vitals:   06/23/16 1048  BP: 116/76  Pulse: 80  SpO2: 98%  Weight: 250 lb (113.4 kg)   PHYSICAL EXAM: General:  Well appearing. No resp difficulty HEENT: normal Neck: supple. JVP not elevated. Carotids 2+ bilaterally; no bruits. No lymphadenopathy or thryomegaly appreciated. Cor: PMI normal. Regular rate & rhythm. No rubs, gallops or murmurs. Lungs: clear to auscultation bilaterally.  Abdomen: soft, nontender nondistended. No hepatosplenomegaly. No bruits or masses. Good bowel sounds. Extremities: no cyanosis, clubbing, rash, no edema Neuro: alert & orientedx3, cranial nerves grossly intact. Moves all 4 extremities w/o difficulty. Affect pleasant.  EKG: NSR  71 bpm   ASSESSMENT & PLAN:  1. Chronic Systolic Heart Failure:  Nonischemic cardiomyopathy, possibly related to HTN versus EF.  EF has improved, now up to 55-60% on echo done today and personally reviewed. NYHA class I.  Not volume overloaded on exam.  - Continue lasix 40 mg daily and 25 mg spironolactone daily.  - On goal dose carvedilol - Continue losartan 50 mg daily - Check BMET today.  With spironolactone use, he should have a BMET every 3 months, will arrange.  2. HTN: Stable. Continue current regimen. 3. Tobacco use: Now only rare cigarette smoking.   4.  ETOH: Rare.   Followup in 6 months.   Marca Ancona 06/24/2016

## 2016-06-30 ENCOUNTER — Other Ambulatory Visit: Payer: Self-pay | Admitting: Internal Medicine

## 2016-07-13 ENCOUNTER — Telehealth: Payer: Self-pay | Admitting: Family Medicine

## 2016-07-18 ENCOUNTER — Other Ambulatory Visit: Payer: Self-pay | Admitting: Internal Medicine

## 2016-08-03 ENCOUNTER — Telehealth: Payer: Self-pay | Admitting: Family Medicine

## 2016-08-03 NOTE — Telephone Encounter (Signed)
Pre-visit call. Appointment confirmed. Patient was advised to arrive early for check-in and bring all current medications.

## 2016-08-04 ENCOUNTER — Encounter: Payer: Self-pay | Admitting: Family Medicine

## 2016-08-04 ENCOUNTER — Encounter: Payer: Self-pay | Admitting: Gastroenterology

## 2016-08-04 ENCOUNTER — Ambulatory Visit (INDEPENDENT_AMBULATORY_CARE_PROVIDER_SITE_OTHER): Payer: BLUE CROSS/BLUE SHIELD | Admitting: Family Medicine

## 2016-08-04 VITALS — BP 110/70 | HR 68 | Temp 97.8°F | Ht 71.0 in | Wt 253.2 lb

## 2016-08-04 DIAGNOSIS — Z1211 Encounter for screening for malignant neoplasm of colon: Secondary | ICD-10-CM | POA: Diagnosis not present

## 2016-08-04 DIAGNOSIS — I10 Essential (primary) hypertension: Secondary | ICD-10-CM | POA: Diagnosis not present

## 2016-08-04 DIAGNOSIS — Z6835 Body mass index (BMI) 35.0-35.9, adult: Secondary | ICD-10-CM | POA: Diagnosis not present

## 2016-08-04 DIAGNOSIS — Z720 Tobacco use: Secondary | ICD-10-CM

## 2016-08-04 DIAGNOSIS — R739 Hyperglycemia, unspecified: Secondary | ICD-10-CM

## 2016-08-04 DIAGNOSIS — E669 Obesity, unspecified: Secondary | ICD-10-CM | POA: Insufficient documentation

## 2016-08-04 DIAGNOSIS — I5022 Chronic systolic (congestive) heart failure: Secondary | ICD-10-CM | POA: Diagnosis not present

## 2016-08-04 DIAGNOSIS — E6609 Other obesity due to excess calories: Secondary | ICD-10-CM

## 2016-08-04 LAB — POCT GLYCOSYLATED HEMOGLOBIN (HGB A1C): Hemoglobin A1C: 5.7

## 2016-08-04 NOTE — Assessment & Plan Note (Signed)
Currently smoking a third pack a day. Discussed importance of smoking cessation today. Patient states that he would like to quit but is not quite ready. Discussed medication options to help him quit or quitting cold [place]. Will follow up as needed

## 2016-08-04 NOTE — Progress Notes (Signed)
Subjective:    Patient ID: Shawn Perkins , male   DOB: 03-Dec-1955 , 61 y.o..   MRN: [id number]  HPI  QUINTAVIS BRANDS is here for  Chief Complaint  Patient presents with  . Establish Care   1. Hypertension Blood pressure at home: Does not check regularly.  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  Low salt diet: sometimes compliant  Medications: Compliant with all his medications  Side effects: None  ROS: Denies headache, dizziness, visual changes, nausea, vomiting, chest pain, abdominal pain or shortness of breath. BP Readings from Last 3 Encounters:  08/04/16 110/70  06/23/16 116/76  05/27/14 112/72   2. CHF: Recently saw the cardiologist in May. Heart function had improved. Was kept on current medication regimen.   3.Tobacco abuse: Typically smokes 1/3 pack a day. He notes that he smokes more when he's stressed.   Review of Systems: Per HPI.   Health Maintenance Due  Topic Date Due  . COLONOSCOPY  06/14/2005    Past Medical History: Patient Active Problem List   Diagnosis Date Noted  . Obesity 08/04/2016  . Encounter for screening colonoscopy 08/13/2013  . Cervical radiculitis 06/25/2013  . Abnormal ECG 12/06/2012  . Essential hypertension, benign 12/06/2012  . Chronic systolic heart failure (HCC) 11/21/2010  . ETOH abuse 11/21/2010  . Tobacco abuse 11/21/2010    Medications: reviewed and updated Current Outpatient Prescriptions  Medication Sig Dispense Refill  . carvedilol (COREG) 25 MG tablet TAKE ONE (1) TABLET BY MOUTH TWO (2) TIMES DAILY WITH MEALS 60 tablet 6  . furosemide (LASIX) 40 MG tablet Take 1 tablet (40 mg total) by mouth daily. 30 tablet   . losartan (COZAAR) 100 MG tablet Take 0.5 tablets (50 mg total) by mouth daily. 45 tablet 1  . potassium chloride SA (K-DUR,KLOR-CON) 20 MEQ tablet TAKE ONE (1) TABLET BY MOUTH EVERY DAY 30  tablet 6  . spironolactone (ALDACTONE) 25 MG tablet TAKE ONE (1) TABLET BY MOUTH EVERY DAY 90 tablet 3   No current facility-administered medications for this visit.     Social Hx:  reports that he has been smoking Cigarettes.  He has a 5.04 pack-year smoking history. He has never used smokeless tobacco.   Objective:   BP 110/70 (BP Location: Left Arm, Patient Position: Sitting, Cuff Size: Large)   Pulse 68   Temp 97.8 F (36.6 C) (Oral)   Ht 5\' 11"  (1.803 m)   Wt 253 lb 3.2 oz (114.9 kg)   SpO2 97%   BMI 35.31 kg/m  Physical Exam  Gen: NAD, alert, cooperative with exam, well-appearing HEENT: NCAT, PERRL, clear conjunctiva, oropharynx clear, supple neck Cardiac: Regular rate and rhythm, normal S1/S2, no murmur, no edema, capillary refill brisk  Respiratory: Clear to auscultation bilaterally, no wheezes, non-labored breathing Gastrointestinal: soft, non tender, non distended, bowel sounds present Skin: ~1cm in diameter round bump on right cheek, slight erythema, no drainage Neurological: Alert and oriented, normal gait Psych: good insight, normal mood and affect  Assessment & Plan:  Essential hypertension, benign Controlled and at goal. BP 110/70 today. Goal BP less than 140/90. Normal kidney function and electrolytes when checked last month.  -Continue carvedilol 25 mg twice a day, losartan 150 mg daily, spironolactone 25 mg daily, Lasix 40 mg daily, KDUR  20 mEq daily   Chronic systolic heart failure Improved per last  echocardiogram. Followed up with cardiologist last month. Weight is up about 30 pounds since last checked in 2015. No alarming symptoms. -Continue carvedilol 25 mg twice a day, losartan 150 mg daily, spironolactone 25 mg daily, Lasix 40 mg daily, KDUR  20 mEq daily -Follow-up with cardiology as directed by them, patient states he is having his blood rechecked in August  Tobacco abuse Currently smoking a third pack a day. Discussed importance of smoking  cessation today. Patient states that he would like to quit but is not quite ready. Discussed medication options to help him quit or quitting cold [place]. Will follow up as needed  Encounter for screening colonoscopy Patient agreeable to receiving colonoscopy. Has never had one. -Referral placed to GI for colonoscopy.  Obesity Body mass index is 35.31 kg/m.Marland Kitchen Check hemoglobin A1c today due to significant risk factors for diabetes. A1c 5.7, borderline prediabetes. Discussed healthier eating habits. 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  Lesion on right cheek: Patient concerned about a bump on his right cheek that has been there for years per his report. Did not have time today to do a thorough history as we talked about his other medical conditions but suggested that he be seen our dermatology clinic. He will schedule an appointment.  Orders Placed This Encounter  Procedures  . Ambulatory referral to Gastroenterology    Referral Priority:   Routine    Referral Type:   Consultation    Referral Reason:   Specialty Services Required    Number of Visits Requested:   1  . POCT glycosylated hemoglobin (Hb A1C)    Anders Simmonds, MD [facility], PGY-2

## 2016-08-04 NOTE — Assessment & Plan Note (Signed)
Improved per last echocardiogram. Followed up with cardiologist last month. Weight is up about 30 pounds since last checked in 2015. No alarming symptoms. -Continue carvedilol 25 mg twice a day, losartan 150 mg daily, spironolactone 25 mg daily, Lasix 40 mg daily, KDUR  20 mEq daily -Follow-up with cardiology as directed by them, patient states he is having his blood rechecked in August

## 2016-08-04 NOTE — Patient Instructions (Signed)
Thank you for coming in today, it was so nice to see you! Today we talked about:    Blood pressure; her blood pressure looks great today. Continue taking the medications that we reviewed today  I have placed a referral to have your colonoscopy done. Someone should call you within the next 1-2 weeks   to schedule this.  Bump on right cheek; please schedule an appointment with our dermatology clinic  Quitting smoking is the best thing he can do for your health, let us know if you need nicotine patches or the medications that we talked about to help you quit.  Please follow up within the next couple weeks at our dermatology clinic for the bump on your right cheek. You can schedule this appointment at the front desk before you leave or call the clinic.  Please follow-up with me in 6 months  If we ordered any tests today, you will be notified via telephone of any abnormalities. If everything is normal you will get a letter in the mail.   If you have any questions or concerns, please do not hesitate to call the office at [phone]. You can also message me directly via MyChart.   Sincerely,  Anders Simmonds, MD

## 2016-08-04 NOTE — Assessment & Plan Note (Signed)
Patient agreeable to receiving colonoscopy. Has never had one. -Referral placed to GI for colonoscopy.

## 2016-08-04 NOTE — Assessment & Plan Note (Addendum)
Controlled and at goal. BP 110/70 today. Goal BP less than 140/90. Normal kidney function and electrolytes when checked last month.  -Continue carvedilol 25 mg twice a day, losartan 150 mg daily, spironolactone 25 mg daily, Lasix 40 mg daily, KDUR  20 mEq daily

## 2016-08-04 NOTE — Assessment & Plan Note (Addendum)
Body mass index is 35.31 kg/m.Marland Kitchen Check hemoglobin A1c today due to significant risk factors for diabetes. A1c 5.7, borderline prediabetes. Discussed healthier eating habits. 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

## 2016-09-20 ENCOUNTER — Ambulatory Visit (AMBULATORY_SURGERY_CENTER): Payer: Self-pay

## 2016-09-20 VITALS — Ht 71.0 in | Wt 252.6 lb

## 2016-09-20 DIAGNOSIS — Z1211 Encounter for screening for malignant neoplasm of colon: Secondary | ICD-10-CM

## 2016-09-20 MED ORDER — SUPREP BOWEL PREP KIT 17.5-3.13-1.6 GM/177ML PO SOLN: 1.0000 | Freq: Once | ORAL | 0 refills | Status: AC

## 2016-09-20 NOTE — Progress Notes (Signed)
No allergies to eggs or soy No diet meds No home oxygen No past problems with anesthesia  Declined emmi 

## 2016-09-22 ENCOUNTER — Encounter: Payer: Self-pay | Admitting: Gastroenterology

## 2016-09-25 ENCOUNTER — Ambulatory Visit (HOSPITAL_COMMUNITY)
Admission: RE | Admit: 2016-09-25 | Discharge: 2016-09-25 | Disposition: A | Discharge status: Home / Self Care | Payer: BLUE CROSS/BLUE SHIELD | Source: Ambulatory Visit | Attending: Internal Medicine | Admitting: Internal Medicine

## 2016-09-25 DIAGNOSIS — I5022 Chronic systolic (congestive) heart failure: Secondary | ICD-10-CM | POA: Diagnosis not present

## 2016-09-25 LAB — BASIC METABOLIC PANEL
ANION GAP: 9 (ref 5–15)
BUN: 15 mg/dL (ref 6–20)
CHLORIDE: 106 mmol/L (ref 101–111)
CO2: 24 mmol/L (ref 22–32)
Calcium: 10 mg/dL (ref 8.9–10.3)
Creatinine, Ser: 1.32 mg/dL — ABNORMAL HIGH (ref 0.61–1.24)
GFR calc Af Amer: >60 mL/min (ref >60)
GFR, EST NON AFRICAN AMERICAN: 57 mL/min — AB (ref >60)
Glucose, Bld: 112 mg/dL — ABNORMAL HIGH (ref 65–99)
POTASSIUM: 4 mmol/L (ref 3.5–5.1)
SODIUM: 139 mmol/L (ref 135–145)

## 2016-09-28 ENCOUNTER — Telehealth: Payer: Self-pay | Admitting: Gastroenterology

## 2016-09-28 NOTE — Progress Notes (Deleted)
After calling in the suprep, the pharmacy called and stated that his insurance will not pay for it.    I told her that we will use mirilax and tablets for the prep now.  She will call the patient and tell him tho pick up the prep instructions at the front desk at 4th floor.

## 2016-09-28 NOTE — Telephone Encounter (Signed)
Spoke with patient about prep, and then called pharmacy to confirm suprep order.  Patient is aware that it was sent in, and that he could pick it up today.

## 2016-10-04 ENCOUNTER — Encounter: Payer: Self-pay | Admitting: Gastroenterology

## 2016-10-04 ENCOUNTER — Ambulatory Visit (AMBULATORY_SURGERY_CENTER): Payer: BLUE CROSS/BLUE SHIELD | Admitting: Gastroenterology

## 2016-10-04 ENCOUNTER — Encounter: Payer: BLUE CROSS/BLUE SHIELD | Admitting: Gastroenterology

## 2016-10-04 VITALS — BP 112/73 | HR 69 | Temp 98.6°F | Resp 20 | Ht 71.0 in | Wt 252.0 lb

## 2016-10-04 DIAGNOSIS — Z1211 Encounter for screening for malignant neoplasm of colon: Secondary | ICD-10-CM

## 2016-10-04 DIAGNOSIS — Z1212 Encounter for screening for malignant neoplasm of rectum: Secondary | ICD-10-CM

## 2016-10-04 MED ORDER — SODIUM CHLORIDE 0.9 % IV SOLN: 500.0000 mL | INTRAVENOUS | Status: DC

## 2016-10-04 NOTE — Patient Instructions (Signed)
YOU HAD AN ENDOSCOPIC PROCEDURE TODAY AT THE [facility]:   Refer to the procedure report that was given to you for any specific questions about what was found during the examination.  If the procedure report does not answer your questions, please call your gastroenterologist to clarify.  If you requested that your care partner not be given the details of your procedure findings, then the procedure report has been included in a sealed envelope for you to review at your convenience later.  YOU SHOULD EXPECT: Some feelings of bloating in the abdomen. Passage of more gas than usual.  Walking can help get rid of the air that was put into your GI tract during the procedure and reduce the bloating. If you had a lower endoscopy (such as a colonoscopy or flexible sigmoidoscopy) you may notice spotting of blood in your stool or on the toilet paper. If you underwent a bowel prep for your procedure, you may not have a normal bowel movement for a few days.  Please Note:  You might notice some irritation and congestion in your nose or some drainage.  This is from the oxygen used during your procedure.  There is no need for concern and it should clear up in a day or so.  SYMPTOMS TO REPORT IMMEDIATELY:   Following lower endoscopy (colonoscopy or flexible sigmoidoscopy):  Excessive amounts of blood in the stool  Significant tenderness or worsening of abdominal pains  Swelling of the abdomen that is new, acute  Fever of 100F or higher   For urgent or emergent issues, a gastroenterologist can be reached at any hour by calling [phone].   DIET:  We do recommend a small meal at first, but then you may proceed to your regular diet.  Drink plenty of fluids but you should avoid alcoholic beverages for 24 hours.  ACTIVITY:  You should plan to take it easy for the rest of today and you should NOT DRIVE or use heavy machinery until tomorrow (because of the sedation medicines used during the test).     FOLLOW UP: Our staff will call the number listed on your records the next business day following your procedure to check on you and address any questions or concerns that you may have regarding the information given to you following your procedure. If we do not reach you, we will leave a message.  However, if you are feeling well and you are not experiencing any problems, there is no need to return our call.  We will assume that you have returned to your regular daily activities without incident.  If any biopsies were taken you will be contacted by phone or by letter within the next 1-3 weeks.  Please call us at [phone] if you have not heard about the biopsies in 3 weeks.   Repeat Colonoscopy in 10 years    SIGNATURES/CONFIDENTIALITY: You and/or your care partner have signed paperwork which will be entered into your electronic medical record.  These signatures attest to the fact that that the information above on your After Visit Summary has been reviewed and is understood.  Full responsibility of the confidentiality of this discharge information lies with you and/or your care-partner. 

## 2016-10-04 NOTE — Op Note (Signed)
[facility] Patient Name: Shawn Perkins Procedure Date: 10/04/2016 10:19 AM MRN: [id number] Endoscopist: Napoleon Form , MD Age: 61 Referring MD:  Date of Birth: May 27, 1955 Gender: Male Account #: [id number] Procedure:                Colonoscopy Indications:              Screening for colorectal malignant neoplasm, This                            is the patient's first colonoscopy Medicines:                Monitored Anesthesia Care Procedure:                Pre-Anesthesia Assessment:                           - Prior to the procedure, a History and Physical                            was performed, and patient medications and                            allergies were reviewed. The patient's tolerance of                            previous anesthesia was also reviewed. The risks                            and benefits of the procedure and the sedation                            options and risks were discussed with the patient.                            All questions were answered, and informed consent                            was obtained. Prior Anticoagulants: The patient has                            taken no previous anticoagulant or antiplatelet                            agents. ASA Grade Assessment: III - A patient with                            severe systemic disease. After reviewing the risks                            and benefits, the patient was deemed in                            satisfactory condition to undergo the procedure.  After obtaining informed consent, the colonoscope                            was passed under direct vision. Throughout the                            procedure, the patient's blood pressure, pulse, and                            oxygen saturations were monitored continuously. The                            Model CF-HQ190L [id number]) scope was introduced                            through the anus and  advanced to the the cecum,                            identified by appendiceal orifice and ileocecal                            valve. The colonoscopy was performed without                            difficulty. The patient tolerated the procedure                            well. The quality of the bowel preparation was                            adequate. The ileocecal valve, appendiceal orifice,                            and rectum were photographed. Scope In: 10:30:37 AM Scope Out: 10:44:04 AM Scope Withdrawal Time: 0 hours 9 minutes 3 seconds  Total Procedure Duration: 0 hours 13 minutes 27 seconds  Findings:                 The perianal and digital rectal examinations were                            normal.                           Multiple small and large-mouthed diverticula were                            found in the sigmoid colon, descending colon,                            transverse colon and ascending colon. There was                            narrowing of the colon in association with the  diverticular opening. There was evidence of                            diverticular spasm. Peri-diverticular erythema was                            seen. There was evidence of an impacted                            diverticulum. Petechia were visualized in                            association with the diverticular opening. There                            was no evidence of diverticular bleeding.                           Non-bleeding internal hemorrhoids were found during                            retroflexion. The hemorrhoids were small.                           The exam was otherwise without abnormality. Complications:            No immediate complications. Estimated Blood Loss:     Estimated blood loss: none. Impression:               - Severe diverticulosis in the sigmoid colon, in                            the descending colon, in the transverse  colon and                            in the ascending colon. There was narrowing of the                            colon in association with the diverticular opening.                            There was evidence of diverticular spasm.                            Peri-diverticular erythema was seen. There was                            evidence of an impacted diverticulum. Petechia were                            visualized in association with the diverticular                            opening. There was no evidence of diverticular  bleeding.                           - Non-bleeding internal hemorrhoids.                           - The examination was otherwise normal.                           - No specimens collected. Recommendation:           - Patient has a contact number available for                            emergencies. The signs and symptoms of potential                            delayed complications were discussed with the                            patient. Return to normal activities tomorrow.                            Written discharge instructions were provided to the                            patient.                           - Resume previous diet.                           - Continue present medications.                           - Repeat colonoscopy in 10 years for screening                            purposes. Napoleon Form, MD 10/04/2016 10:51:58 AM This report has been signed electronically.

## 2016-10-04 NOTE — Progress Notes (Signed)
A and O x3. Report to RN. Tolerated MAC anesthesia well.

## 2016-10-04 NOTE — Progress Notes (Signed)
Pt's states no medical or surgical changes since previsit or office visit. 

## 2016-10-05 ENCOUNTER — Telehealth: Payer: Self-pay | Admitting: *Deleted

## 2016-10-05 NOTE — Telephone Encounter (Signed)
  Follow up Call-  Call back number 10/04/2016  Post procedure Call Back phone  # [phone]  Permission to leave phone message Yes  Some recent data might be hidden     Patient questions:  Do you have a fever, pain , or abdominal swelling? No. Pain Score  0 *  Have you tolerated food without any problems? Yes.    Have you been able to return to your normal activities? Yes.    Do you have any questions about your discharge instructions: Diet   No. Medications  No. Follow up visit  No.  Do you have questions or concerns about your Care? No.  Actions: * If pain score is 4 or above: No action needed, pain <4.

## 2016-11-13 ENCOUNTER — Other Ambulatory Visit (HOSPITAL_COMMUNITY): Payer: Self-pay | Admitting: Internal Medicine

## 2016-11-15 MED ORDER — LOSARTAN POTASSIUM 100 MG PO TABS: ORAL_TABLET | ORAL | 3 refills | Status: DC

## 2016-11-15 NOTE — Addendum Note (Signed)
Addended by: Theresia Bough on: 11/15/2016 08:53 AM   Modules accepted: Orders

## 2016-12-13 ENCOUNTER — Other Ambulatory Visit (HOSPITAL_COMMUNITY): Payer: Self-pay | Admitting: Adult Health

## 2017-01-24 ENCOUNTER — Other Ambulatory Visit: Payer: Self-pay | Admitting: Internal Medicine

## 2017-02-15 ENCOUNTER — Other Ambulatory Visit: Payer: Self-pay | Admitting: Internal Medicine

## 2017-03-13 ENCOUNTER — Other Ambulatory Visit (HOSPITAL_COMMUNITY): Payer: Self-pay | Admitting: Cardiology

## 2017-03-15 ENCOUNTER — Other Ambulatory Visit (HOSPITAL_COMMUNITY): Payer: Self-pay | Admitting: *Deleted

## 2017-03-15 MED ORDER — SPIRONOLACTONE 25 MG PO TABS: 25.0000 mg | ORAL_TABLET | Freq: Every day | ORAL | 0 refills | Status: DC

## 2017-04-24 ENCOUNTER — Ambulatory Visit (HOSPITAL_COMMUNITY)
Admission: RE | Admit: 2017-04-24 | Discharge: 2017-04-24 | Disposition: A | Discharge status: Home / Self Care | Payer: BLUE CROSS/BLUE SHIELD | Source: Ambulatory Visit | Attending: Cardiology | Admitting: Cardiology

## 2017-04-24 ENCOUNTER — Encounter (HOSPITAL_COMMUNITY): Payer: Self-pay | Admitting: Cardiology

## 2017-04-24 VITALS — BP 130/88 | HR 72 | Wt 253.0 lb

## 2017-04-24 DIAGNOSIS — Z9114 Patient's other noncompliance with medication regimen: Secondary | ICD-10-CM | POA: Insufficient documentation

## 2017-04-24 DIAGNOSIS — F17210 Nicotine dependence, cigarettes, uncomplicated: Secondary | ICD-10-CM | POA: Diagnosis not present

## 2017-04-24 DIAGNOSIS — I10 Essential (primary) hypertension: Secondary | ICD-10-CM

## 2017-04-24 DIAGNOSIS — I5042 Chronic combined systolic (congestive) and diastolic (congestive) heart failure: Secondary | ICD-10-CM | POA: Insufficient documentation

## 2017-04-24 DIAGNOSIS — I739 Peripheral vascular disease, unspecified: Secondary | ICD-10-CM | POA: Diagnosis not present

## 2017-04-24 DIAGNOSIS — F1411 Cocaine abuse, in remission: Secondary | ICD-10-CM | POA: Insufficient documentation

## 2017-04-24 DIAGNOSIS — I5022 Chronic systolic (congestive) heart failure: Secondary | ICD-10-CM

## 2017-04-24 DIAGNOSIS — I429 Cardiomyopathy, unspecified: Secondary | ICD-10-CM | POA: Diagnosis not present

## 2017-04-24 DIAGNOSIS — I110 Hypertensive heart disease with heart failure: Secondary | ICD-10-CM | POA: Diagnosis not present

## 2017-04-24 DIAGNOSIS — Z7982 Long term (current) use of aspirin: Secondary | ICD-10-CM | POA: Diagnosis not present

## 2017-04-24 DIAGNOSIS — Z79899 Other long term (current) drug therapy: Secondary | ICD-10-CM | POA: Diagnosis not present

## 2017-04-24 LAB — BASIC METABOLIC PANEL
ANION GAP: 8 (ref 5–15)
BUN: 13 mg/dL (ref 6–20)
CO2: 25 mmol/L (ref 22–32)
Calcium: 9.7 mg/dL (ref 8.9–10.3)
Chloride: 106 mmol/L (ref 101–111)
Creatinine, Ser: 1.14 mg/dL (ref 0.61–1.24)
GFR calc non Af Amer: >60 mL/min (ref >60)
GLUCOSE: 103 mg/dL — AB (ref 65–99)
POTASSIUM: 4.3 mmol/L (ref 3.5–5.1)
Sodium: 139 mmol/L (ref 135–145)

## 2017-04-24 MED ORDER — ASPIRIN EC 81 MG PO TBEC: 81.0000 mg | DELAYED_RELEASE_TABLET | Freq: Every day | ORAL | 3 refills | Status: DC

## 2017-04-24 NOTE — Patient Instructions (Signed)
Start Asprin 81 mg (1 tab) daily  Your physician has requested that you have a peripheral vascular angiogram. This exam is performed at the hospital. During this exam IV contrast is used to look at arterial blood flow. Please review the information sheet given for details.  Labs drawn today (if we do not call you, then your lab work was stable)   Your physician recommends that you return for lab work in: 3 months   Your physician recommends that you schedule a follow-up appointment in: 6 months Drue Dun, 2019)  with Dr. Shirlee Latch  Please Call an Schedule Appointment

## 2017-04-24 NOTE — Addendum Note (Signed)
Encounter addended by: Laurey Morale, MD on: 04/24/2017 5:00 PM  Actions taken: Sign clinical note

## 2017-04-24 NOTE — Progress Notes (Addendum)
Patient ID: Shawn Perkins, male   DOB: [birth year], 62 y.o.   MRN: [id number]  PCP: Dr. Jonathon Jordan Cardiology: Dr. Shirlee Latch  HPI: Shawn Perkins is a 62 year old African  American male with a h/o severe HTN, tobacco and ETOH use,  NICM, chronic systolic HF.   Cath (11/11/10) which showed normal cors. EF 20% RA 7, RV 51/4, PA 61/30, with a mean of 47, PCWP 30.  Cardiac output was 4.0.  He was diuresed aggressively and started on lisinopril, spiro, carvedilol and lasix. His discharge weight was 99.8 kg.   Admitted to [facility] 11/22/2012 -11/25/12 with acute/chronic systolic heart failure in setting of medication noncompliance x several months and severe HTN. HF medications restarted. Repeat ECHO revealed  EF back down to 35-40% from 55%.  D/C weight was 214 pounds.   Last echo in 5/18 showed EF 55-60%, moderate LVH, normal RV size and systolic function.   He returns today for followup of CHF and HTN.  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  No exertional dyspnea or chest pain.  He does note leg fatigue walking up stairs.  He is still smoking 5-6 cigarettes/day.   02/06/2011 ECHO 55-60% 11/22/12 ECHO  EF 35-40%  06/18/13 ECHO EF 50-55%  5/18 Echo: EF 55-60%, moderate LVH, normal RV size and systolic function.  ECG (personally reviewed): NSR, normal  Labs 12/02/12: K+ 5.1, creatinine 1.6 12/10/12: K+ 3.5, creatinine 0.6 12/23/12: K+ 4.4, creatinine 1.17 5/18: LDL 70, HDL 33 8/18: K 4, creatinine 1.3  ROS: All systems negative except as listed in HPI, PMH and Problem List.  Past Medical History:  Diagnosis Date  . CHF (congestive heart failure) (HCC)   . Heart murmur   . History of cocaine abuse    use to smoke crack cocaine, last use was more than 6 years ago  . Hypertension   . Rheumatic fever    as a child  . Shortness of breath   . Systolic heart failure    a. 11/09/10 echo EF 35%, b. 11/11/10 cath norm cors c. ECHO (11/2012) EF 35-50%  .  Umbilical hernia, incarcerated 11/2012   11/2012 exploratory lap with small bwl resection    Current Outpatient Medications  Medication Sig Dispense Refill  . Aspirin-Acetaminophen-Caffeine (GOODY HEADACHE PO) Take by mouth.    . carvedilol (COREG) 25 MG tablet TAKE ONE (1) TABLET BY MOUTH TWO (2) TIMES DAILY WITH MEALS 60 tablet 6  . furosemide (LASIX) 40 MG tablet Take 1 tablet (40 mg total) by mouth daily. 30 tablet   . losartan (COZAAR) 100 MG tablet TAKE 0.5 TABLET (50 MG TOTAL) BY MOUTH DAILY 45 tablet 3  . potassium chloride SA (K-DUR,KLOR-CON) 20 MEQ tablet TAKE ONE (1) TABLET BY MOUTH EVERY DAY 30 tablet 6  . spironolactone (ALDACTONE) 25 MG tablet Take 1 tablet (25 mg total) by mouth daily. Needs office visit 90 tablet 0  . aspirin EC 81 MG tablet Take 1 tablet (81 mg total) by mouth daily. 90 tablet 3   Current Facility-Administered Medications  Medication Dose Route Frequency Provider Last Rate Last Dose  . 0.9 %  sodium chloride infusion  500 mL Intravenous Continuous [name], [name], MD       Vitals:   04/24/17 0909  BP: 130/88  Pulse: 72  SpO2: 97%  Weight: 253 lb (114.8 kg)   PHYSICAL EXAM: General: NAD Neck: No JVD, no thyromegaly or thyroid nodule.  Lungs: Clear to  auscultation bilaterally with normal respiratory effort. CV: Nondisplaced PMI.  Heart regular S1/S2, no S3/S4, no murmur.  No peripheral edema.  No carotid bruit.  Unable to palpate right PT pulse.  Abdomen: Soft, nontender, no hepatosplenomegaly, no distention.  Skin: Intact without lesions or rashes.  Neurologic: Alert and oriented x 3.  Psych: Normal affect. Extremities: No clubbing or cyanosis.  HEENT: Normal.   ASSESSMENT & PLAN:  1. Chronic Diastolic Heart Failure:  Nonischemic cardiomyopathy, possibly related to HTN versus EF.  EF has improved, now up to 55-60% on echo in 5/18. NYHA class I.  Not volume overloaded on exam.  - Continue lasix 40 mg daily and 25 mg spironolactone daily.  BMET today.  Will need BMET q3 months with spironolactone use.  - Continue current Coreg. - Continue losartan 50 mg daily 2. HTN: Stable on current regimen. 3. Tobacco use: < 1 ppd.  I strongly encouraged him to quit.    4. ETOH: Rare.  5. PAD: Leg fatigue walking up stairs, cannot palpate right pedal pulses.  I will order peripheral arterial doppler evaluation.   I recommended that he take ASA 81 mg daily.   Followup in 6 months.   Marca Ancona 04/24/2017

## 2017-04-26 ENCOUNTER — Other Ambulatory Visit (HOSPITAL_COMMUNITY): Payer: Self-pay | Admitting: Cardiology

## 2017-04-26 DIAGNOSIS — I739 Peripheral vascular disease, unspecified: Secondary | ICD-10-CM

## 2017-05-07 ENCOUNTER — Ambulatory Visit (HOSPITAL_COMMUNITY)
Admission: RE | Admit: 2017-05-07 | Discharge: 2017-05-07 | Disposition: A | Discharge status: Home / Self Care | Payer: BLUE CROSS/BLUE SHIELD | Source: Ambulatory Visit | Attending: Cardiovascular Disease | Admitting: Cardiovascular Disease

## 2017-05-07 DIAGNOSIS — F17200 Nicotine dependence, unspecified, uncomplicated: Secondary | ICD-10-CM | POA: Diagnosis not present

## 2017-05-07 DIAGNOSIS — I739 Peripheral vascular disease, unspecified: Secondary | ICD-10-CM | POA: Insufficient documentation

## 2017-05-07 DIAGNOSIS — M25562 Pain in left knee: Secondary | ICD-10-CM | POA: Diagnosis not present

## 2017-05-07 DIAGNOSIS — I10 Essential (primary) hypertension: Secondary | ICD-10-CM | POA: Diagnosis not present

## 2017-05-07 DIAGNOSIS — R200 Anesthesia of skin: Secondary | ICD-10-CM | POA: Insufficient documentation

## 2017-06-12 ENCOUNTER — Other Ambulatory Visit (HOSPITAL_COMMUNITY): Payer: Self-pay | Admitting: Cardiology

## 2017-07-25 ENCOUNTER — Ambulatory Visit (HOSPITAL_COMMUNITY)
Admission: RE | Admit: 2017-07-25 | Discharge: 2017-07-25 | Disposition: A | Discharge status: Home / Self Care | Payer: BLUE CROSS/BLUE SHIELD | Source: Ambulatory Visit | Attending: Internal Medicine | Admitting: Internal Medicine

## 2017-07-25 DIAGNOSIS — I5022 Chronic systolic (congestive) heart failure: Secondary | ICD-10-CM | POA: Diagnosis not present

## 2017-07-25 LAB — BASIC METABOLIC PANEL
Anion gap: 10 (ref 5–15)
BUN: 19 mg/dL (ref 6–20)
CO2: 24 mmol/L (ref 22–32)
CREATININE: 1.29 mg/dL — AB (ref 0.61–1.24)
Calcium: 9.5 mg/dL (ref 8.9–10.3)
Chloride: 108 mmol/L (ref 101–111)
GFR calc Af Amer: >60 mL/min (ref >60)
GFR calc non Af Amer: 58 mL/min — ABNORMAL LOW (ref >60)
Glucose, Bld: 111 mg/dL — ABNORMAL HIGH (ref 65–99)
Potassium: 4.1 mmol/L (ref 3.5–5.1)
SODIUM: 142 mmol/L (ref 135–145)

## 2017-08-20 ENCOUNTER — Other Ambulatory Visit: Payer: Self-pay | Admitting: Cardiology

## 2017-09-10 ENCOUNTER — Other Ambulatory Visit: Payer: Self-pay | Admitting: Internal Medicine

## 2017-09-10 ENCOUNTER — Other Ambulatory Visit (HOSPITAL_COMMUNITY): Payer: Self-pay | Admitting: Cardiology

## 2017-10-19 ENCOUNTER — Encounter (HOSPITAL_COMMUNITY): Payer: Self-pay

## 2017-10-19 ENCOUNTER — Ambulatory Visit (HOSPITAL_COMMUNITY)
Admission: EM | Admit: 2017-10-19 | Discharge: 2017-10-19 | Disposition: A | Discharge status: Home / Self Care | Payer: BLUE CROSS/BLUE SHIELD | Attending: Family Medicine | Admitting: Family Medicine

## 2017-10-19 DIAGNOSIS — M5431 Sciatica, right side: Secondary | ICD-10-CM | POA: Diagnosis not present

## 2017-10-19 MED ORDER — MELOXICAM 7.5 MG PO TABS: 7.5000 mg | ORAL_TABLET | Freq: Every day | ORAL | 0 refills | Status: DC

## 2017-10-19 MED ORDER — PREDNISONE 10 MG (21) PO TBPK: ORAL_TABLET | ORAL | 0 refills | Status: DC

## 2017-10-19 NOTE — Discharge Instructions (Signed)
It was nice meeting you!!  We will give you  some prednisone (steroids)  for the sciatic nerve pain.  Let us know if not getting better in the next few days and we can do an xray.  Follow up as needed for continued or worsening symptoms You  continue with meloxicam after the prednisone is finished if you are still having pain.

## 2017-10-19 NOTE — ED Triage Notes (Signed)
Pt presents with complaints of lower back pain that shoots down to his right knee. Patient denies injury.

## 2017-10-22 ENCOUNTER — Encounter (HOSPITAL_COMMUNITY): Payer: Self-pay | Admitting: Family Medicine

## 2017-10-22 NOTE — ED Provider Notes (Signed)
[facility]    CSN: [id number] Arrival date & time: 10/19/17  1001     History   Chief Complaint Chief Complaint  Patient presents with  . Back Pain    HPI Shawn Perkins is a 62 y.o. male.   Patient is a 62 year old male with past medical history of congestive heart failure, heart murmur, cocaine abuse, hypertension, rheumatic fever, shortness of breath.  He presents with 1 week of right lower lumbar pain that radiates into his right thigh.  This problem has been constant and worsening.  He describes it as a sharp pain shooting down his leg and some numbness at times.  He has not taken anything for pain.  The pain is worse when he has been sitting and then he stands to try to walk.  He denies any injury to the back, heavy lifting, strenuous exercise.  He does work and is on his feet a lot with a lot of twisting.  He has been taking Goody's powder with minimal relief of pain.  He is a current smoker. He does drink and smokes marijuana. Family history of cancer, COPD, diabetes, heart disease, kidney failure  ROS per HPI      Past Medical History:  Diagnosis Date  . CHF (congestive heart failure) (HCC)   . Heart murmur   . History of cocaine abuse    use to smoke crack cocaine, last use was more than 6 years ago  . Hypertension   . Rheumatic fever    as a child  . Shortness of breath   . Systolic heart failure    a. 11/09/10 echo EF 35%, b. 11/11/10 cath norm cors c. ECHO (11/2012) EF 35-50%  . Umbilical hernia, incarcerated 11/2012   11/2012 exploratory lap with small bwl resection    Patient Active Problem List   Diagnosis Date Noted  . Obesity 08/04/2016  . Encounter for screening colonoscopy 08/13/2013  . Cervical radiculitis 06/25/2013  . Abnormal ECG 12/06/2012  . Essential hypertension, benign 12/06/2012  . Chronic systolic heart failure (HCC) 11/21/2010  . ETOH abuse 11/21/2010  . Tobacco abuse 11/21/2010    Past Surgical History:  Procedure  Laterality Date  . BOWEL RESECTION  12/05/2012   Procedure: SMALL BOWEL RESECTION;  Surgeon: Axel Filler, MD;  Location: [facility] OR;  Service: General;;  . SMALL INTESTINE SURGERY  12/05/2012  . TONSILLECTOMY    . UMBILICAL HERNIA REPAIR     2014       Home Medications    Prior to Admission medications   Medication Sig Start Date End Date Taking? Authorizing Provider  aspirin EC 81 MG tablet Take 1 tablet (81 mg total) by mouth daily. 04/24/17   Laurey Morale, MD  Aspirin-Acetaminophen-Caffeine (GOODY HEADACHE PO) Take by mouth.    [provider]  carvedilol (COREG) 25 MG tablet TAKE ONE (1) TABLET BY MOUTH TWO (2) TIMES DAILY 09/10/17   Bensimhon, Bevelyn Buckles, MD  furosemide (LASIX) 40 MG tablet Take 1 tablet (40 mg total) by mouth daily. 05/01/13   Clegg, Amy D, NP  furosemide (LASIX) 40 MG tablet TAKE ONE (1) TABLET BY MOUTH EVERY DAY 08/20/17   Laurey Morale, MD  losartan (COZAAR) 100 MG tablet TAKE 0.5 TABLET (50 MG TOTAL) BY MOUTH DAILY 11/15/16   Laurey Morale, MD  meloxicam (MOBIC) 7.5 MG tablet Take 1 tablet (7.5 mg total) by mouth daily. 10/19/17   Dahlia Byes A, NP  potassium chloride SA (K-DUR,KLOR-CON)  20 MEQ tablet TAKE ONE (1) TABLET BY MOUTH EVERY DAY 08/20/17   Laurey Morale, MD  predniSONE (STERAPRED UNI-PAK 21 TAB) 10 MG (21) TBPK tablet 6 tabs for 1 day, then 5 tabs for 1 das, then 4 tabs for 1 day, then 3 tabs for 1 day, 2 tabs for 1 day, then 1 tab for 1 day 10/19/17   Dahlia Byes A, NP  spironolactone (ALDACTONE) 25 MG tablet TAKE ONE (1) TABLET BY MOUTH EVERY DAY 09/10/17   Laurey Morale, MD    Family History Family History  Problem Relation Age of Onset  . Kidney failure Mother   . Healthy Father   . Heart disease Neg Hx   . Diabetes Neg Hx   . Hyperlipidemia Neg Hx   . COPD Neg Hx   . Colon cancer Neg Hx   . Rectal cancer Neg Hx   . Stomach cancer Neg Hx     Social History Social History   Tobacco Use  . Smoking status: Current Some Day  Smoker    Packs/day: 0.12    Years: 42.00    Pack years: 5.04    Types: Cigarettes  . Smokeless tobacco: Never Used  Substance Use Topics  . Alcohol use: Yes    Alcohol/week: 4.0 standard drinks    Types: 4 Cans of beer per week    Comment: 12/05/2012 "no more than 12 pack beer/week; haven't had anything in 3 wks"  . Drug use: Yes    Types: Marijuana, "Crack" cocaine    Comment: 12/05/2012 used to smoke weed & crack cocaine; none in over 6 years ago"     Allergies   Penicillins   Review of Systems Review of Systems  Constitutional: Positive for activity change. Negative for appetite change, chills and fever.  Gastrointestinal: Negative for abdominal pain, blood in stool, constipation, diarrhea, nausea and vomiting.  Endocrine: Negative for polyuria.  Genitourinary: Negative for dysuria, frequency, hematuria and urgency.  Musculoskeletal: Positive for back pain and myalgias. Negative for joint swelling, neck pain and neck stiffness.  Allergic/Immunologic: Negative for immunocompromised state.  Neurological: Positive for numbness. Negative for weakness.  Hematological: Does not bruise/bleed easily.     Physical Exam Triage Vital Signs ED Triage Vitals [10/19/17 1039]  Enc Vitals Group     BP 111/79     Pulse Rate 76     Resp 18     Temp 97.8 F (36.6 C)     Temp src      SpO2 98 %     Weight      Height      Head Circumference      Peak Flow      Pain Score 8     Pain Loc      Pain Edu?      Excl. in GC?    No data found.  Updated Vital Signs BP 111/79   Pulse 76   Temp 97.8 F (36.6 C)   Resp 18   SpO2 98%   Visual Acuity Right Eye Distance:   Left Eye Distance:   Bilateral Distance:    Right Eye Near:   Left Eye Near:    Bilateral Near:     Physical Exam  Constitutional: He is oriented to person, place, and time. He appears well-developed and well-nourished.  Very pleasant.  Nontoxic or ill-appearing  HENT:  Head: Normocephalic and  atraumatic.  Nose: Nose normal.  Eyes: Conjunctivae are normal.  Neck: Normal  range of motion.  Cardiovascular: Regular rhythm.  Pulmonary/Chest: Effort normal.  Abdominal: Soft.  Musculoskeletal: He exhibits tenderness. He exhibits no edema or deformity.  Non tender to palpation of the lumbar spine. Mild tenderness with deep palpation of the lumbar paravertebral muscles and sciatic notch.  Positive straight leg test.  Walking in exam room  Limping.   Neurological: He is alert and oriented to person, place, and time.  Skin: Skin is warm and dry.  Psychiatric: He has a normal mood and affect.  Nursing note and vitals reviewed.    UC Treatments / Results  Labs (all labs ordered are listed, but only abnormal results are displayed) Labs Reviewed - No data to display  EKG None  Radiology No results found.  Procedures Procedures (including critical care time)  Medications Ordered in UC Medications - No data to display  Initial Impression / Assessment and Plan / UC Course  I have reviewed the triage vital signs and the nursing notes.  Pertinent labs & imaging results that were available during my care of the patient were reviewed by me and considered in my medical decision making (see chart for details).     Radiculopathy- most likely sciatic nerve pain. Will treat with prednisone taper. Once this is complete he can start the meloxicam if still having pain.  Patient understanding and agreeable to plan Final Clinical Impressions(s) / UC Diagnoses   Final diagnoses:  Sciatica of right side     Discharge Instructions     It was nice meeting you!!  We will give you  some prednisone (steroids)  for the sciatic nerve pain.  Let us know if not getting better in the next few days and we can do an xray.  Follow up as needed for continued or worsening symptoms You  continue with meloxicam after the prednisone is finished if you are still having pain.       ED  Prescriptions    Medication Sig Dispense Auth. Provider   predniSONE (STERAPRED UNI-PAK 21 TAB) 10 MG (21) TBPK tablet 6 tabs for 1 day, then 5 tabs for 1 das, then 4 tabs for 1 day, then 3 tabs for 1 day, 2 tabs for 1 day, then 1 tab for 1 day 21 tablet Arlina Sabina A, NP   meloxicam (MOBIC) 7.5 MG tablet Take 1 tablet (7.5 mg total) by mouth daily. 30 tablet Dahlia Byes A, NP     Controlled Substance Prescriptions [place] Controlled Substance Registry consulted? Not Applicable   Janace Aris, NP 10/22/17 1048

## 2017-11-06 ENCOUNTER — Ambulatory Visit (HOSPITAL_COMMUNITY)
Admission: EM | Admit: 2017-11-06 | Discharge: 2017-11-06 | Disposition: A | Discharge status: Other Acute Inpatient Hospital | Payer: BLUE CROSS/BLUE SHIELD

## 2017-11-07 ENCOUNTER — Other Ambulatory Visit (HOSPITAL_COMMUNITY): Payer: Self-pay | Admitting: Cardiology

## 2017-11-07 ENCOUNTER — Encounter (HOSPITAL_COMMUNITY): Payer: Self-pay | Admitting: Emergency Medicine

## 2017-11-07 ENCOUNTER — Ambulatory Visit (HOSPITAL_COMMUNITY)
Admission: EM | Admit: 2017-11-07 | Discharge: 2017-11-07 | Disposition: A | Discharge status: Home / Self Care | Payer: BLUE CROSS/BLUE SHIELD | Attending: Family Medicine | Admitting: Family Medicine

## 2017-11-07 ENCOUNTER — Ambulatory Visit (INDEPENDENT_AMBULATORY_CARE_PROVIDER_SITE_OTHER): Payer: BLUE CROSS/BLUE SHIELD

## 2017-11-07 DIAGNOSIS — M5441 Lumbago with sciatica, right side: Secondary | ICD-10-CM | POA: Diagnosis not present

## 2017-11-07 DIAGNOSIS — M545 Low back pain: Secondary | ICD-10-CM | POA: Diagnosis not present

## 2017-11-07 MED ORDER — TRAMADOL HCL 50 MG PO TABS: 50.0000 mg | ORAL_TABLET | Freq: Four times a day (QID) | ORAL | 0 refills | Status: DC | PRN

## 2017-11-07 MED ORDER — CYCLOBENZAPRINE HCL 5 MG PO TABS: 5.0000 mg | ORAL_TABLET | Freq: Every day | ORAL | 0 refills | Status: DC

## 2017-11-07 NOTE — Discharge Instructions (Addendum)

## 2017-11-07 NOTE — ED Triage Notes (Signed)
Pt states he was here two weeks ago for his back, given some medicine for it, "I think it was a steroid, and if it didn't help come back". Pt states the medicine didn't help at all. Pt states that he was told he would be able to get an xray.

## 2017-11-08 NOTE — ED Provider Notes (Signed)
[facility]   [id number] 11/07/17 Arrival Time: 1025  ASSESSMENT & PLAN:  1. Acute bilateral low back pain with right-sided sciatica     Meds ordered this encounter  Medications  . cyclobenzaprine (FLEXERIL) 5 MG tablet    Sig: Take 1 tablet (5 mg total) by mouth at bedtime.    Dispense:  10 tablet    Refill:  0  . traMADol (ULTRAM) 50 MG tablet    Sig: Take 1 tablet (50 mg total) by mouth every 6 (six) hours as needed.    Dispense:  12 tablet    Refill:  0   Medication sedation precautions. Encouraged ROM.  Follow-up Information    Schedule an appointment as soon as possible for a visit  with Westley Chandler, MD.   Specialty:  [facility] Medicine Contact information: [address] [place] [address] [phone]          Reviewed expectations re: course of current medical issues. Questions answered. Outlined signs and symptoms indicating need for more acute intervention. Patient verbalized understanding. After Visit Summary given.   SUBJECTIVE: History from: patient.  Shawn Perkins is a 62 y.o. male who presents with complaint of intermittent but ongoing bilateral lower back discomfort. Seen approx 2 weeks ago. Prescribed medication did not help. No specific worsening. Onset gradual beginning several weeks ago. Injury/trama: does not think so. History of back problems: occasional. Discomfort described as aching and with occasional sharp feeling with transient radiation to RLE. Certain movements exacerbate the described discomfort. Better with rest. Extremity sensation changes or weakness: none. Ambulatory without difficulty. Normal bowel/bladder habits. No associated abdominal pain/n/v. Self treatment: has OTC without relief.  Reports no fevers, IV drug use, or recent back surgeries or procedures.  ROS: As per HPI.   OBJECTIVE:  Vitals:   11/07/17 1037  BP: 110/73  Pulse: 72  Resp: 16  Temp: 97.9 F (36.6 C)  SpO2: 99%    General appearance:  alert; no distress Neck: supple with FROM; without midline tenderness Lungs: unlabored respirations; symmetrical air entry Abdomen: soft, non-tender; bowel sounds normal; no masses or organomegaly; no guarding or rebound tenderness Back: bilateral lower tenderness present over lumbar musculature; FROM at hips with mild discomfort reported; bruising: none; he questions midline tenderness to palpation Extremities: no cyanosis or edema; symmetrical with no gross deformities Skin: warm and dry Neurologic: normal gait; normal symmetric reflexes; normal LE strength and sensation; SLR negative bilaterally Psychological: alert and cooperative; normal mood and affect  Imaging: Dg Lumbar Spine Complete  Result Date: 11/07/2017 CLINICAL DATA:  Low back pain. EXAM: LUMBAR SPINE - COMPLETE 4+ VIEW COMPARISON:  CT abdomen pelvis 02/15/2012. FINDINGS: Vertebral body heights and alignment are stable. There straightening of the normal lumbar lordosis. Moderate set hypertrophy has progressed at L5-S1. Atherosclerotic changes are present in the aorta without definite aneurysm. IMPRESSION: 1. Moderate facet degenerative changes at L5-S1 may contribute to foraminal disease. 2. No acute abnormality. Electronically Signed   By: Marin Roberts M.D.   On: 11/07/2017 12:04    Allergies  Allergen Reactions  . Penicillins Other (See Comments)    Childhood reaction    Past Medical History:  Diagnosis Date  . CHF (congestive heart failure) (HCC)   . Heart murmur   . History of cocaine abuse    use to smoke crack cocaine, last use was more than 6 years ago  . Hypertension   . Rheumatic fever    as a child  . Shortness  of breath   . Systolic heart failure    a. 11/09/10 echo EF 35%, b. 11/11/10 cath norm cors c. ECHO (11/2012) EF 35-50%  . Umbilical hernia, incarcerated 11/2012   11/2012 exploratory lap with small bwl resection   Social History   Socioeconomic History  . Marital status: Single    Spouse  name: Not on file  . Number of children: Not on file  . Years of education: Not on file  . Highest education level: Not on file  Occupational History  . Not on file  Social Needs  . Financial resource strain: Not on file  . Food insecurity:    Worry: Not on file    Inability: Not on file  . Transportation needs:    Medical: Not on file    Non-medical: Not on file  Tobacco Use  . Smoking status: Current Some Day Smoker    Packs/day: 0.12    Years: 42.00    Pack years: 5.04    Types: Cigarettes  . Smokeless tobacco: Never Used  Substance and Sexual Activity  . Alcohol use: Yes    Alcohol/week: 4.0 standard drinks    Types: 4 Cans of beer per week    Comment: 12/05/2012 "no more than 12 pack beer/week; haven't had anything in 3 wks"  . Drug use: Yes    Types: Marijuana, "Crack" cocaine    Comment: 12/05/2012 used to smoke weed & crack cocaine; none in over 6 years ago"  . Sexual activity: Not Currently  Lifestyle  . Physical activity:    Days per week: Not on file    Minutes per session: Not on file  . Stress: Not on file  Relationships  . Social connections:    Talks on phone: Not on file    Gets together: Not on file    Attends religious service: Not on file    Active member of club or organization: Not on file    Attends meetings of clubs or organizations: Not on file    Relationship status: Not on file  . Intimate partner violence:    Fear of current or ex partner: Not on file    Emotionally abused: Not on file    Physically abused: Not on file    Forced sexual activity: Not on file  Other Topics Concern  . Not on file  Social History Narrative  . Not on file   Family History  Problem Relation Age of Onset  . Kidney failure Mother   . Healthy Father   . Heart disease Neg Hx   . Diabetes Neg Hx   . Hyperlipidemia Neg Hx   . COPD Neg Hx   . Colon cancer Neg Hx   . Rectal cancer Neg Hx   . Stomach cancer Neg Hx    Past Surgical History:  Procedure  Laterality Date  . BOWEL RESECTION  12/05/2012   Procedure: SMALL BOWEL RESECTION;  Surgeon: Axel Filler, MD;  Location: [facility] OR;  Service: General;;  . SMALL INTESTINE SURGERY  12/05/2012  . TONSILLECTOMY    . UMBILICAL HERNIA REPAIR     2014     Mardella Layman, MD 11/08/17 [phone]

## 2018-03-08 ENCOUNTER — Other Ambulatory Visit (HOSPITAL_COMMUNITY): Payer: Self-pay | Admitting: Cardiology

## 2018-03-08 NOTE — Telephone Encounter (Signed)
Pt needs appointment for future refills 

## 2018-03-15 ENCOUNTER — Other Ambulatory Visit (HOSPITAL_COMMUNITY): Payer: Self-pay | Admitting: Cardiology

## 2018-03-15 NOTE — Telephone Encounter (Signed)
Pt needs follow up appointment [phone] 

## 2018-03-22 ENCOUNTER — Other Ambulatory Visit (HOSPITAL_COMMUNITY): Payer: Self-pay | Admitting: Cardiology

## 2018-03-22 NOTE — Telephone Encounter (Signed)
Pt needs appt for future refills [phone] 

## 2018-04-15 ENCOUNTER — Other Ambulatory Visit: Payer: Self-pay | Admitting: Internal Medicine

## 2018-05-06 ENCOUNTER — Other Ambulatory Visit (HOSPITAL_COMMUNITY): Payer: Self-pay | Admitting: Cardiology

## 2018-05-20 MED FILL — FUROSEMIDE 40 MG TAB: 40 | 30 days supply | Qty: 30 | Fill #0

## 2018-06-05 MED FILL — SPIRONOLACTONE 25 MG TABS: 25 | 90 days supply | Qty: 90 | Fill #0

## 2018-06-12 MED FILL — FUROSEMIDE 40 MG TAB: 40 | 30 days supply | Qty: 30 | Fill #1

## 2018-06-12 MED FILL — POTASSIUM CHLORIDE CRYS ER: 20 | 30 days supply | Qty: 30 | Fill #0

## 2018-06-12 MED FILL — CARVEDILOL 25 MG TABLET: 25 | 30 days supply | Qty: 60 | Fill #0

## 2018-07-04 ENCOUNTER — Encounter: Payer: Self-pay | Admitting: Family Medicine

## 2018-07-05 ENCOUNTER — Ambulatory Visit: Payer: BLUE CROSS/BLUE SHIELD | Admitting: Family Medicine

## 2018-07-05 ENCOUNTER — Other Ambulatory Visit: Payer: Self-pay

## 2018-07-05 ENCOUNTER — Encounter: Payer: Self-pay | Admitting: Family Medicine

## 2018-07-05 VITALS — BP 92/60 | HR 79

## 2018-07-05 DIAGNOSIS — I10 Essential (primary) hypertension: Secondary | ICD-10-CM

## 2018-07-05 DIAGNOSIS — G8929 Other chronic pain: Secondary | ICD-10-CM

## 2018-07-05 DIAGNOSIS — M5441 Lumbago with sciatica, right side: Secondary | ICD-10-CM | POA: Diagnosis not present

## 2018-07-05 DIAGNOSIS — I5022 Chronic systolic (congestive) heart failure: Secondary | ICD-10-CM

## 2018-07-05 DIAGNOSIS — R7301 Impaired fasting glucose: Secondary | ICD-10-CM | POA: Diagnosis not present

## 2018-07-05 MED ORDER — FUROSEMIDE 40 MG PO TABS: 40.0000 mg | ORAL_TABLET | Freq: Every day | ORAL | 3 refills | Status: DC

## 2018-07-05 NOTE — Patient Instructions (Addendum)
It was wonderful to see you today.  Thank you for choosing [facility].   Please call [phone] with any questions about today's appointment.  Please be sure to schedule follow up at the front  desk before you leave today.   Terisa Starr, MD  Family Medicine   Great job on your weight loss  I will call you with labs---they will be back on Monday  Tetanus shot at your pharmacy after the summer           Mediterranean Diet  Why follow it? Research shows. . Those who follow the Mediterranean diet have a reduced risk of heart disease  . The diet is associated with a reduced incidence of Parkinson's and Alzheimer's diseases . People following the diet may have longer life expectancies and lower rates of chronic diseases  . The Dietary Guidelines for Americans recommends the Mediterranean diet as an eating plan to promote health and prevent disease  What Is the Mediterranean Diet?  . Healthy eating plan based on typical foods and recipes of Mediterranean-style cooking . The diet is primarily a plant based diet; these foods should make up a majority of meals   Starches - Plant based foods should make up a majority of meals - They are an important sources of vitamins, minerals, energy, antioxidants, and fiber - Choose whole grains, foods high in fiber and minimally processed items  - Typical grain sources include wheat, oats, barley, corn, brown rice, bulgar, farro, millet, polenta, couscous  - Various types of beans include chickpeas, lentils, fava beans, black beans, white beans   Fruits  Veggies - Large quantities of antioxidant rich fruits & veggies; 6 or more servings  - Vegetables can be eaten raw or lightly drizzled with oil and cooked  - Vegetables common to the traditional Mediterranean Diet include: artichokes, arugula, beets, broccoli, brussel sprouts, cabbage, carrots, celery, collard greens, cucumbers, eggplant, kale, leeks, lemons, lettuce,  mushrooms, okra, onions, peas, peppers, potatoes, pumpkin, radishes, rutabaga, shallots, spinach, sweet potatoes, turnips, zucchini - Fruits common to the Mediterranean Diet include: apples, apricots, avocados, cherries, clementines, dates, figs, grapefruits, grapes, melons, nectarines, oranges, peaches, pears, pomegranates, strawberries, tangerines  Fats - Replace butter and margarine with healthy oils, such as olive oil, canola oil, and tahini  - Limit nuts to no more than a handful a day  - Nuts include walnuts, almonds, pecans, pistachios, pine nuts  - Limit or avoid candied, honey roasted or heavily salted nuts - Olives are central to the [employer] - can be eaten whole or used in a variety of dishes   Meats Protein - Limiting red meat: no more than a few times a month - When eating red meat: choose lean cuts and keep the portion to the size of deck of cards - Eggs: approx. 0 to 4 times a week  - Fish and lean poultry: at least 2 a week  - Healthy protein sources include, chicken, [place], lean beef, lamb - Increase intake of seafood such as tuna, salmon, trout, mackerel, shrimp, scallops - Avoid or limit high fat processed meats such as sausage and bacon  Dairy - Include moderate amounts of low fat dairy products  - Focus on healthy dairy such as fat free yogurt, skim milk, low or reduced fat cheese - Limit dairy products higher in fat such as whole or 2% milk, cheese, ice cream  Alcohol - Moderate amounts of red wine is ok  - No more than 5  oz daily for women (all ages) and men older than age 59  - No more than 10 oz of wine daily for men younger than 65  Other - Limit sweets and other desserts  - Use herbs and spices instead of salt to flavor foods  - Herbs and spices common to the traditional Mediterranean Diet include: basil, bay leaves, chives, cloves, cumin, fennel, garlic, lavender, marjoram, mint, oregano, parsley, pepper, rosemary, sage, savory, sumac, tarragon, thyme    It's not just a diet, it's a lifestyle:  . The Mediterranean diet includes lifestyle factors typical of those in the region  . Foods, drinks and meals are best eaten with others and savored . Daily physical activity is important for overall good health . This could be strenuous exercise like running and aerobics . This could also be more leisurely activities such as walking, housework, yard-work, or taking the stairs . Moderation is the key; a balanced and healthy diet accommodates most foods and drinks . Consider portion sizes and frequency of consumption of certain foods   Meal Ideas & Options:  . Breakfast:  o Whole wheat toast or whole wheat English muffins with peanut butter & hard boiled egg o Steel cut oats topped with apples & cinnamon and skim milk  o Fresh fruit: banana, strawberries, melon, berries, peaches  o Smoothies: strawberries, bananas, greek yogurt, peanut butter o Low fat greek yogurt with blueberries and granola  o Egg white omelet with spinach and mushrooms o Breakfast couscous: whole wheat couscous, apricots, skim milk, cranberries  . Sandwiches:  o Hummus and grilled vegetables (peppers, zucchini, squash) on whole wheat bread   o Grilled chicken on whole wheat pita with lettuce, tomatoes, cucumbers or tzatziki  o Tuna salad on whole wheat bread: tuna salad made with greek yogurt, olives, red peppers, capers, green onions o Garlic rosemary lamb pita: lamb sauted with garlic, rosemary, salt & pepper; add lettuce, cucumber, greek yogurt to pita - flavor with lemon juice and black pepper  . Seafood:  o Mediterranean grilled salmon, seasoned with garlic, basil, parsley, lemon juice and black pepper o Shrimp, lemon, and spinach whole-grain pasta salad made with low fat greek yogurt  o Seared scallops with lemon orzo  o Seared tuna steaks seasoned salt, pepper, coriander topped with tomato mixture of olives, tomatoes, olive oil, minced garlic, parsley, green onions  and cappers  . Meats:  o Herbed greek chicken salad with kalamata olives, cucumber, feta  o Red bell peppers stuffed with spinach, bulgur, lean ground beef (or lentils) & topped with feta   o Kebabs: skewers of chicken, tomatoes, onions, zucchini, squash  o [place] burgers: made with red onions, mint, dill, lemon juice, feta cheese topped with roasted red peppers . Vegetarian o Cucumber salad: cucumbers, artichoke hearts, celery, red onion, feta cheese, tossed in olive oil & lemon juice  o Hummus and whole grain pita points with a greek salad (lettuce, tomato, feta, olives, cucumbers, red onion) o Lentil soup with celery, carrots made with vegetable broth, garlic, salt and pepper  o Tabouli salad: parsley, bulgur, mint, scallions, cucumbers, tomato, radishes, lemon juice, olive oil, salt and pepper.

## 2018-07-05 NOTE — Progress Notes (Signed)
Patient Name: Shawn Perkins Date of Birth: 1955/09/21 Date of Visit: 07/05/18 PCP: Westley Chandler, MD  Chief Complaint: right leg numbness   Subjective: Shawn Perkins is a pleasant 63 y.o. with medical history significant for umbilical hernia status post repair, heart failure with previously reduced ejection fraction, dyslipidemia, hypertension and elevated fasting blood glucose presenting today for check-in and concerned about his back.   The patient reports a 91-month history of right low back pain.  The pain began 1 day while he was at work.  He urgent care and diagnosed with sciatica and given a prednisone taper.  He then returned to care and was given tramadol and Flexeril.  He was also given meloxicam during his course.  He reports his back pain has eased off as has started walking more and trying to lose weight.  He believes his back pain is related to his excess abdominal weight.  His weight is down 10 pounds, this is been desired.  He endorses some right leg "sleepiness". He denies incontinence, bowel/bladder changes, weakness, falls. He endorses numbness that extends from the posterior right sacrum to the below the knee. No left sided numbness. Numbness is described as leg 'sleepiness'. He is interested in PT.   The patient reports compliance with his medications. No adverse effects. No falls. He has transient dizziness when standing up too fast. No chest pain, headaches, polyuria, orthopnea, PND, or LE edema  The patients smokes 4 cigarettes per day. He does intermittently have beer throughout the week. Works 2 jobs--- [employer] and also fixes issues in apartment complex. Lives alone, has adult children. He is not married. He has Sundays off and attends church regularly. Mood has been okay during coronavirus pandemic.   Patient is pre-contemplative about quitting smoking---working on weight loss right now. No shortness of breath or chronic cough.  ROS: as above.  ROS  I have reviewed the patient's medical, surgical, family, and social history as appropriate.   Vitals:   07/05/18 0826  BP: 92/60  Pulse: 79  SpO2: 97%   There were no vitals filed for this visit.  HEENT: Sclera anicteric. Appears well hydrated. Neck: Supple Cardiac: Regular rate and rhythm. Normal S1/S2. No murmurs, rubs, or gallops appreciated. Lungs: Clear bilaterally to ascultation.  Abdomen: Normoactive bowel sounds. No tenderness to deep or light palpation. No rebound or guarding.  There is a large midline scar with hyperpigmentation that is well-healed.  There is a moderate-sized ventral hernia which is approximately 6-1/2 to 7 cm in diameter at the superior margin of the incision site this of appear to contain a small amount of bowel which is easily reduced through the hernia.  Extremities: Warm, well perfused without edema.  Skin: Warm, dry Psych: Pleasant and appropriate  MSK: Back with some hyperpigmentation scars healing no abnormal lesions on back.  Tenderness to palpation along right SI joint.  No deformity of back.  Gait is with slight antalgia.  Preserved lower extremity strength and sensation.  Negative straight leg raise bilaterally he has a negative Pearlean Brownie and Fader on the right hip range of motion normal and logroll test negative on right   [name] was seen today for follow-up.  Diagnoses and all orders for this visit:  Essential hypertension, at goal today denies any side effects.  Updated medication list. -     Basic Metabolic Panel  Chronic systolic heart failure (HCC) the patient is to follow-up with cardiology as scheduled. -  Lipid Panel ---likely needs a statin given tobacco use and HTN  -     furosemide (LASIX) 40 MG tablet; Take 1 tablet (40 mg total) by mouth daily.  Elevated fasting glucose -     Hemoglobin A1c  Chronic bilateral low back pain with right-sided sciatica most likely due to degenerative disc disease of the back.  Hip exam not suggestive of  hip osteoarthritis, he does report some sciatic type symptoms although this is not reproducible on exam.  We discussed the utility of an MRI at this time given his improvement in the symptoms and his excellent functional status.  He reports he would never want to have surgery.  Recommended Tylenol for pain.  Recommended against NSAIDs.  Referral to physical therapy he should use ice packs as well as heating packs as needed.  If physical therapy does not improve his symptoms will consider MRI and referral to orthopedics -     Ambulatory referral to Physical Therapy  Healthcare maintenance The patient was given a prescription for tetanus Colonoscopy is due in 2028  Terisa Starr, MD  Family Medicine Teaching Service

## 2018-07-06 LAB — BASIC METABOLIC PANEL
BUN/Creatinine Ratio: 13 (ref 10–24)
BUN: 14 mg/dL (ref 8–27)
CO2: 21 mmol/L (ref 20–29)
Calcium: 9.3 mg/dL (ref 8.6–10.2)
Chloride: 107 mmol/L — ABNORMAL HIGH (ref 96–106)
Creatinine, Ser: 1.11 mg/dL (ref 0.76–1.27)
GFR calc Af Amer: 81 mL/min/{1.73_m2} (ref >59)
GFR calc non Af Amer: 70 mL/min/{1.73_m2} (ref >59)
Glucose: 102 mg/dL — ABNORMAL HIGH (ref 65–99)
Potassium: 4.1 mmol/L (ref 3.5–5.2)
Sodium: 143 mmol/L (ref 134–144)

## 2018-07-06 LAB — LIPID PANEL
Chol/HDL Ratio: 3.9 ratio (ref 0.0–5.0)
Cholesterol, Total: 157 mg/dL (ref 100–199)
HDL: 40 mg/dL (ref >39)
LDL Calculated: 93 mg/dL (ref 0–99)
Triglycerides: 122 mg/dL (ref 0–149)
VLDL Cholesterol Cal: 24 mg/dL (ref 5–40)

## 2018-07-06 LAB — HEMOGLOBIN A1C
Est. average glucose Bld gHb Est-mCnc: 117 mg/dL
Hgb A1c MFr Bld: 5.7 % — ABNORMAL HIGH (ref 4.8–5.6)

## 2018-07-08 ENCOUNTER — Telehealth: Payer: Self-pay | Admitting: Family Medicine

## 2018-07-08 DIAGNOSIS — E785 Hyperlipidemia, unspecified: Secondary | ICD-10-CM

## 2018-07-08 MED ORDER — ATORVASTATIN CALCIUM 20 MG PO TABS: 20.0000 mg | ORAL_TABLET | Freq: Every day | ORAL | 3 refills | Status: DC

## 2018-07-08 MED FILL — ATORVASTATIN 20 MG TABLET: 20 | 90 days supply | Qty: 90 | Fill #0

## 2018-07-08 NOTE — Telephone Encounter (Signed)
Patient's regarding recent lab results.  BMP is within normal limits.  A1c is mildly elevated.  Discussed diagnosis of prediabetes.  Patient unaware that he previously had this diagnosis.  Started atorvastatin nightly of 20 mg.  He will need a repeat lipid panel in 4 to 6 weeks.  Reminder was sent to the provider myself to call the patient in about 6 weeks to remind him to have a repeat lipid panel.   The 10-year ASCVD risk score Denman George DC Montez Hageman., et al., 2013) is: 13.8%   Values used to calculate the score:     Age: 63 years     Sex: Male     Is Non-Hispanic African American: Yes     Diabetic: No     Tobacco smoker: Yes     Systolic Blood Pressure: 92 mmHg     Is BP treated: Yes     HDL Cholesterol: 40 mg/dL     Total Cholesterol: 157 mg/dL Terisa Starr, MD  Family Medicine Teaching Service

## 2018-07-11 ENCOUNTER — Other Ambulatory Visit: Payer: Self-pay | Admitting: Internal Medicine

## 2018-07-11 MED FILL — POTASSIUM CHLORIDE CRYS ER: 20 | 30 days supply | Qty: 30 | Fill #1

## 2018-07-11 MED FILL — FUROSEMIDE 40 MG TAB: 40 | 30 days supply | Qty: 30 | Fill #2

## 2018-07-11 NOTE — Telephone Encounter (Signed)
Patient has been seen since 04/24/2017. He was suppose to follow up in 6 months but, did not. Please advise if refill is appropriate

## 2018-07-17 ENCOUNTER — Other Ambulatory Visit (HOSPITAL_COMMUNITY): Payer: Self-pay | Admitting: *Deleted

## 2018-07-17 ENCOUNTER — Ambulatory Visit: Payer: BLUE CROSS/BLUE SHIELD

## 2018-07-18 MED FILL — CARVEDILOL 25 MG TABLET: 25 | 30 days supply | Qty: 60 | Fill #0

## 2018-07-25 ENCOUNTER — Other Ambulatory Visit: Payer: Self-pay

## 2018-07-25 ENCOUNTER — Ambulatory Visit: Payer: BLUE CROSS/BLUE SHIELD | Attending: Family Medicine

## 2018-07-25 DIAGNOSIS — G8929 Other chronic pain: Secondary | ICD-10-CM

## 2018-07-25 DIAGNOSIS — M5441 Lumbago with sciatica, right side: Secondary | ICD-10-CM | POA: Insufficient documentation

## 2018-07-25 NOTE — Therapy (Signed)
[facility] [address] [place], [place], [address] Phone: [phone]   Fax:  [phone]  Physical Therapy Evaluation  Patient Details  Name: Shawn Perkins MRN: [id number] Date of Birth: [birth year] Referring Provider (PT): Ernesto Rutherford   Encounter Date: 07/25/2018  PT End of Session - 07/25/18 0841    Visit Number  1    Number of Visits  12    Date for PT Re-Evaluation  09/06/18    PT Start Time  0841    PT Stop Time  0910    PT Time Calculation (min)  29 min    Activity Tolerance  Patient tolerated treatment well    Behavior During Therapy  [facility] for tasks assessed/performed       Past Medical History:  Diagnosis Date  . CHF (congestive heart failure) (HCC)   . Diverticulosis   . Heart murmur   . History of cocaine abuse (HCC)    use to smoke crack cocaine, last use was more than 6 years ago  . Hypertension   . Rheumatic fever    as a child  . Systolic heart failure    a. 11/09/10 echo EF 35%, b. 11/11/10 cath norm cors c. ECHO (11/2012) EF 35-50%  . Umbilical hernia, incarcerated 11/2012   11/2012 exploratory lap with small bwl resection    Past Surgical History:  Procedure Laterality Date  . BOWEL RESECTION  12/05/2012   Procedure: SMALL BOWEL RESECTION;  Surgeon: Axel Filler, MD;  Location: [facility] OR;  Service: General;;  . SMALL INTESTINE SURGERY  12/05/2012  . TONSILLECTOMY    . UMBILICAL HERNIA REPAIR     2014    There were no vitals filed for this visit.   Subjective Assessment - 07/25/18 0850    Subjective  He reports LBP that is better  though continues to get RT leg heaviness after being on feet for long periods at work.  He works 2 jobs.   He is more concerned  abpout abdominal hernia and bleeding in stool. He also reports LT neck pain and headaches that is improveing.   He is having some difficulty being seen due to specific health insurance through [facility] health.     He also reports dizziness  on transitions and with leaning over.     Pertinent History  HR 84  H    BP 90/49     Standing   HR  81    BP   77/46            [facility] PT Assessment - 07/25/18 0001      Assessment   Medical Diagnosis  chronic LBP , RT sciatica    Referring Provider (PT)  Carina Brown,MD    Prior Therapy  No      Precautions   Precautions  None      Restrictions   Weight Bearing Restrictions  No      Balance Screen   Has the patient fallen in the past 6 months  No      Prior Function   Level of Independence  Independent      Cognition   Overall Cognitive Status  Within Functional Limits for tasks assessed      ROM / Strength   AROM / PROM / Strength  AROM;PROM;Strength      Flexibility   Soft Tissue Assessment /Muscle Length  yes  Objective measurements completed on examination: See above findings.              PT Education - 07/25/18 0928    Education Details  Asked him to call [facility] clinic again to ask for getting appointment dut o hernia, blood in stool, dizziness and blood pressure and that ER was also an option.     Person(s) Educated  Patient    Methods  Explanation    Comprehension  Verbalized understanding       PT Short Term Goals - 07/25/18 0843      PT SHORT TERM GOAL #1   Title  He will be independent with initial HEP    Time  2    Period  Weeks    Status  New      PT SHORT TERM GOAL #2   Title  He will report pain decr 25% or more in back    Time  2    Period  Weeks    Status  New        PT Long Term Goals - 07/25/18 0844      PT LONG TERM GOAL #1   Title  He will be independent with all HEP issued    Time  6    Period  Weeks    Status  New      PT LONG TERM GOAL #2   Title  He will report pain and intermittant and decreased 76% or more    Time  6    Period  Weeks    Status  New      PT LONG TERM GOAL #3   Title  He will return to        Time  6    Period  Weeks    Status  New      PT LONG TERM GOAL  #4   Title  He will improve his Foto score to     % limited to demo percieved improvement in function    Time  6    Period  Weeks    Status  New             Plan - 07/25/18 0845    Clinical Impression Statement  Shawn Perkins presents with complaints of multiple medical issues  with the back pain being the least of his concerns.   He reports    neck pain but his main concern is with his abdominal hernia, blood in stools , blurred vision and dizziness with transitional moviment and leaning over. His BP was lower in standing at 77/46  than in sitting  90/46.  His insurance is with [facility] health so he has limited providers and the [facility] clinic he called here in town stated they were closed due to covid-19 pandemic.  I asked him to call clinic back and ask how loing the closure will be and what are his options.  He thought he was being seen here in this clinic for the other problems not his back and opted out for now on back care as his other medical issues are his primary concern.   I told him he could  return if need in  future if he needs PT.     Examination-Activity Limitations  --    PT Frequency  2x / week    PT Duration  6 weeks    PT Treatment/Interventions  Taping;Passive range of motion;Dry needling;Manual techniques;Therapeutic exercise;Therapeutic activities;Electrical Stimulation;Moist  Heat;Traction;Patient/family education    Consulted and Agree with Plan of Care  Patient       Patient will benefit from skilled therapeutic intervention in order to improve the following deficits and impairments:  Pain, Postural dysfunction, Increased muscle spasms, Decreased activity tolerance, Decreased range of motion, Difficulty walking  Visit Diagnosis: Chronic bilateral low back pain with right-sided sciatica     Problem List Patient Active Problem List   Diagnosis Date Noted  . Obesity 08/04/2016  . Encounter for screening colonoscopy 08/13/2013  . Cervical radiculitis  06/25/2013  . Abnormal ECG 12/06/2012  . Essential hypertension 12/06/2012  . Chronic systolic heart failure (HCC) 11/21/2010  . ETOH abuse 11/21/2010  . Tobacco abuse 11/21/2010    Shawn Perkins  PT 07/25/2018, 9:42 AM  [facility] [address] [place], [place], [address] Phone: [phone]   Fax:  [phone]  Name: Shawn Perkins MRN: [id number] Date of Birth: 1955-09-08

## 2018-07-25 NOTE — Therapy (Signed)
[facility] [address] [place], [place], [address] Phone: [phone]   Fax:  [phone]  Patient Details  Name: KORRY KITTS MRN: [id number] Date of Birth: 04/27/1955 Referring Provider:  Westley Chandler, MD  Encounter Date: 07/25/2018   Caprice Red 07/25/2018, 9:42 AM  [facility] [address] [place], [place], [address] Phone: [phone]   Fax:  [phone]

## 2018-07-31 MED FILL — LOSARTAN POTASSIUM 100 MG T: 100 | 30 days supply | Qty: 15 | Fill #0

## 2018-08-13 MED FILL — CARVEDILOL 25 MG TABLET: 25 | 30 days supply | Qty: 60 | Fill #1

## 2018-08-13 MED FILL — FUROSEMIDE 40 MG TAB: 40 | 30 days supply | Qty: 30 | Fill #3

## 2018-08-13 MED FILL — POTASSIUM CHLORIDE CRYS ER: 20 | 30 days supply | Qty: 30 | Fill #2

## 2018-08-19 ENCOUNTER — Telehealth: Payer: Self-pay | Admitting: Family Medicine

## 2018-08-19 DIAGNOSIS — E785 Hyperlipidemia, unspecified: Secondary | ICD-10-CM

## 2018-08-19 NOTE — Telephone Encounter (Signed)
Nursing- please call patient. He was recently started on a medication for cholesterol. I recommend a repeat cholesterol panel. This has been ordered. Patient may come to lab to have this drawn.  Thanks, Dorris Singh, MD  [facility] Medicine Teaching Service

## 2018-08-20 ENCOUNTER — Other Ambulatory Visit: Payer: BLUE CROSS/BLUE SHIELD

## 2018-09-03 ENCOUNTER — Other Ambulatory Visit (HOSPITAL_COMMUNITY): Payer: Self-pay | Admitting: Cardiology

## 2018-10-14 ENCOUNTER — Other Ambulatory Visit (HOSPITAL_COMMUNITY): Payer: Self-pay | Admitting: Cardiology

## 2018-10-14 DIAGNOSIS — I5022 Chronic systolic (congestive) heart failure: Secondary | ICD-10-CM

## 2018-11-01 ENCOUNTER — Other Ambulatory Visit (HOSPITAL_COMMUNITY): Payer: Self-pay | Admitting: Cardiology

## 2018-11-12 ENCOUNTER — Other Ambulatory Visit (HOSPITAL_COMMUNITY): Payer: Self-pay | Admitting: Cardiology

## 2018-11-12 DIAGNOSIS — I5022 Chronic systolic (congestive) heart failure: Secondary | ICD-10-CM

## 2018-12-04 ENCOUNTER — Other Ambulatory Visit (HOSPITAL_COMMUNITY): Payer: Self-pay | Admitting: Cardiology

## 2018-12-12 ENCOUNTER — Other Ambulatory Visit (HOSPITAL_COMMUNITY): Payer: Self-pay | Admitting: Cardiology

## 2018-12-12 DIAGNOSIS — I5022 Chronic systolic (congestive) heart failure: Secondary | ICD-10-CM

## 2018-12-30 ENCOUNTER — Other Ambulatory Visit (HOSPITAL_COMMUNITY): Payer: Self-pay | Admitting: Cardiology

## 2018-12-30 DIAGNOSIS — I5022 Chronic systolic (congestive) heart failure: Secondary | ICD-10-CM

## 2019-01-09 ENCOUNTER — Other Ambulatory Visit (HOSPITAL_COMMUNITY): Payer: Self-pay | Admitting: Cardiology

## 2019-01-09 DIAGNOSIS — I5022 Chronic systolic (congestive) heart failure: Secondary | ICD-10-CM

## 2019-01-14 ENCOUNTER — Other Ambulatory Visit (HOSPITAL_COMMUNITY): Payer: Self-pay | Admitting: Cardiology

## 2019-01-23 ENCOUNTER — Ambulatory Visit (HOSPITAL_COMMUNITY)
Admission: RE | Admit: 2019-01-23 | Discharge: 2019-01-23 | Disposition: A | Discharge status: Home / Self Care | Payer: BLUE CROSS/BLUE SHIELD | Source: Ambulatory Visit | Attending: Cardiology | Admitting: Cardiology

## 2019-01-23 ENCOUNTER — Other Ambulatory Visit: Payer: Self-pay

## 2019-01-23 VITALS — BP 102/58 | HR 65 | Wt 237.2 lb

## 2019-01-23 DIAGNOSIS — I428 Other cardiomyopathies: Secondary | ICD-10-CM | POA: Diagnosis not present

## 2019-01-23 DIAGNOSIS — Z7982 Long term (current) use of aspirin: Secondary | ICD-10-CM | POA: Insufficient documentation

## 2019-01-23 DIAGNOSIS — I5022 Chronic systolic (congestive) heart failure: Secondary | ICD-10-CM | POA: Diagnosis not present

## 2019-01-23 DIAGNOSIS — I10 Essential (primary) hypertension: Secondary | ICD-10-CM | POA: Diagnosis not present

## 2019-01-23 DIAGNOSIS — F17210 Nicotine dependence, cigarettes, uncomplicated: Secondary | ICD-10-CM | POA: Diagnosis not present

## 2019-01-23 DIAGNOSIS — Z79899 Other long term (current) drug therapy: Secondary | ICD-10-CM | POA: Insufficient documentation

## 2019-01-23 DIAGNOSIS — I110 Hypertensive heart disease with heart failure: Secondary | ICD-10-CM | POA: Insufficient documentation

## 2019-01-23 DIAGNOSIS — I5042 Chronic combined systolic (congestive) and diastolic (congestive) heart failure: Secondary | ICD-10-CM | POA: Insufficient documentation

## 2019-01-23 LAB — BASIC METABOLIC PANEL
Anion gap: 10 (ref 5–15)
BUN: 17 mg/dL (ref 8–23)
CO2: 21 mmol/L — ABNORMAL LOW (ref 22–32)
Calcium: 9.3 mg/dL (ref 8.9–10.3)
Chloride: 108 mmol/L (ref 98–111)
Creatinine, Ser: 1.03 mg/dL (ref 0.61–1.24)
GFR calc Af Amer: >60 mL/min (ref >60)
GFR calc non Af Amer: >60 mL/min (ref >60)
Glucose, Bld: 93 mg/dL (ref 70–99)
Potassium: 4 mmol/L (ref 3.5–5.1)
Sodium: 139 mmol/L (ref 135–145)

## 2019-01-23 NOTE — Patient Instructions (Signed)
Labs done today, we will notify you for abnormal results  Your physician has requested that you have an echocardiogram. Echocardiography is a painless test that uses sound waves to create images of your heart. It provides your doctor with information about the size and shape of your heart and how well your heart's chambers and valves are working. This procedure takes approximately one hour. There are no restrictions for this procedure.  Please call our office in October 2021 to schedule your yearly follow-up for December 2021  If you have any questions or concerns before your next appointment please send [place] a message through [place] or call our office at [phone].  At the [facility], you and your health needs are our priority. As part of our continuing mission to provide you with exceptional heart care, we have created designated Provider Care Teams. These Care Teams include your primary Cardiologist (physician) and Advanced Practice Providers (APPs- Physician Assistants and Nurse Practitioners) who all work together to provide you with the care you need, when you need it.   You may see any of the following providers on your designated Care Team at your next follow up: Marland Kitchen Dr Glori Bickers . Dr Loralie Champagne . Darrick Grinder, NP . Lyda Jester, PA . Audry Riles, PharmD   Please be sure to bring in all your medications bottles to every appointment.

## 2019-01-24 NOTE — Progress Notes (Signed)
Patient ID: Shawn Perkins, male   DOB: 05/25/1955, 63 y.o.   MRN: [id number]  PCP: Dr. Manson Passey Cardiology: Dr. Shirlee Latch  HPI: Mr. Geiman is a 63 y.o. African  American male with a h/o severe HTN, tobacco and ETOH use,  NICM, chronic systolic HF.   Cath (11/11/10) which showed normal cors. EF 20% RA 7, RV 51/4, PA 61/30, with a mean of 47, PCWP 30.  Cardiac output was 4.0.  He was diuresed aggressively and started on lisinopril, spiro, carvedilol and lasix. His discharge weight was 99.8 kg.   Admitted to [facility] 11/22/2012 -11/25/12 with acute/chronic systolic heart failure in setting of medication noncompliance x several months and severe HTN. HF medications restarted. Repeat ECHO revealed  EF back down to 35-40% from 55%.  D/C weight was 214 pounds.   Last echo in 5/18 showed EF 55-60%, moderate LVH, normal RV size and systolic function.   He returns today for followup of CHF and HTN.  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  Still smoking 1-2 cigarettes/day.  No significant exertional dyspnea or chest pain. No orthopnea/PND.  Main complaint is low back pain.   02/06/2011 ECHO 55-60% 11/22/12 ECHO  EF 35-40%  06/18/13 ECHO EF 50-55%  5/18 Echo: EF 55-60%, moderate LVH, normal RV size and systolic function.  ABIs (3/19): Normal.   ECG (personally reviewed): NSR, PACs  Labs 12/02/12: K+ 5.1, creatinine 1.6 12/10/12: K+ 3.5, creatinine 0.6 12/23/12: K+ 4.4, creatinine 1.17 5/18: LDL 70, HDL 33 8/18: K 4, creatinine 1.3  5/20: K 4.1, creatinine 1.11, LDL 93  ROS: All systems negative except as listed in HPI, PMH and Problem List.  Past Medical History:  Diagnosis Date  . CHF (congestive heart failure) (HCC)   . Diverticulosis   . Heart murmur   . History of cocaine abuse (HCC)    use to smoke crack cocaine, last use was more than 6 years ago  . Hypertension   . Rheumatic fever    as a child  . Systolic heart failure    a. 11/09/10 echo EF  35%, b. 11/11/10 cath norm cors c. ECHO (11/2012) EF 35-50%  . Umbilical hernia, incarcerated 11/2012   11/2012 exploratory lap with small bwl resection    Current Outpatient Medications  Medication Sig Dispense Refill  . aspirin EC 81 MG tablet Take 1 tablet (81 mg total) by mouth daily. 90 tablet 3  . atorvastatin (LIPITOR) 20 MG tablet Take 1 tablet (20 mg total) by mouth daily at 6 PM. 90 tablet 3  . carvedilol (COREG) 25 MG tablet TAKE ONE (1) TABLET BY MOUTH TWO (2) TIMES DAILY 60 tablet 6  . furosemide (LASIX) 40 MG tablet TAKE 1 TABLET (40 MG TOTAL) BY MOUTH DAILY. NEEDS AN APPT FOR FURTHER REFILLS 30 tablet 0  . losartan (COZAAR) 100 MG tablet TAKE ONE-HALF (1/2) TABLET BY MOUTH DAILY 45 tablet 0  . potassium chloride SA (KLOR-CON) 20 MEQ tablet TAKE 1 TABLET (20 MEQ TOTAL) BY MOUTH DAILY. NEEDS APPT FOR FURTHER REFILLS 30 tablet 0  . spironolactone (ALDACTONE) 25 MG tablet TAKE ONE (1) TABLET BY MOUTH EVERY DAY 90 tablet 0   Current Facility-Administered Medications  Medication Dose Route Frequency Provider Last Rate Last Dose  . 0.9 %  sodium chloride infusion  500 mL Intravenous Continuous Napoleon Form, MD       Vitals:   01/23/19 1032  BP: (!) 102/58  Pulse:  65  SpO2: 99%  Weight: 107.6 kg (237 lb 4 oz)   PHYSICAL EXAM: General: NAD Neck: No JVD, no thyromegaly or thyroid nodule.  Lungs: Clear to auscultation bilaterally with normal respiratory effort. CV: Nondisplaced PMI.  Heart regular S1/S2, no S3/S4, no murmur.  No peripheral edema.  No carotid bruit.  Normal pedal pulses.  Abdomen: Soft, nontender, no hepatosplenomegaly, no distention.  Ventral hernia noted.  Skin: Intact without lesions or rashes.  Neurologic: Alert and oriented x 3.  Psych: Normal affect. Extremities: No clubbing or cyanosis.  HEENT: Normal.   ASSESSMENT & PLAN:  1. Chronic Diastolic Heart Failure:  Nonischemic cardiomyopathy, possibly related to HTN.  EF had improved up to 55-60% on  echo in 5/18. NYHA class I.  Not volume overloaded on exam.  - Continue lasix 40 mg daily and 25 mg spironolactone daily. BMET today.  Will need BMET q3 months with spironolactone use.  - Continue current Coreg. - Continue losartan 50 mg daily - I will arrange for repeat echo to make sure that EF remains normal.  2. HTN: Stable on current regimen. 3. Tobacco use: Only smoking a few cigarettes/day.  I strongly encouraged him to quit.    4. ETOH: Rare.   Followup in 1 year if echo shows normal EF.   Loralie Champagne 01/24/2019

## 2019-02-06 ENCOUNTER — Ambulatory Visit (HOSPITAL_COMMUNITY)
Admission: RE | Admit: 2019-02-06 | Discharge: 2019-02-06 | Disposition: A | Discharge status: Home / Self Care | Payer: BLUE CROSS/BLUE SHIELD | Source: Ambulatory Visit | Attending: Cardiology | Admitting: Cardiology

## 2019-02-06 ENCOUNTER — Other Ambulatory Visit: Payer: Self-pay

## 2019-02-06 DIAGNOSIS — I110 Hypertensive heart disease with heart failure: Secondary | ICD-10-CM | POA: Diagnosis not present

## 2019-02-06 DIAGNOSIS — I082 Rheumatic disorders of both aortic and tricuspid valves: Secondary | ICD-10-CM | POA: Insufficient documentation

## 2019-02-06 DIAGNOSIS — I5022 Chronic systolic (congestive) heart failure: Secondary | ICD-10-CM | POA: Insufficient documentation

## 2019-02-07 ENCOUNTER — Other Ambulatory Visit (HOSPITAL_COMMUNITY): Payer: Self-pay | Admitting: Cardiology

## 2019-02-07 DIAGNOSIS — I5022 Chronic systolic (congestive) heart failure: Secondary | ICD-10-CM

## 2019-03-13 ENCOUNTER — Other Ambulatory Visit (HOSPITAL_COMMUNITY): Payer: Self-pay | Admitting: Cardiology

## 2019-03-13 DIAGNOSIS — I5022 Chronic systolic (congestive) heart failure: Secondary | ICD-10-CM

## 2019-04-11 ENCOUNTER — Other Ambulatory Visit (HOSPITAL_COMMUNITY): Payer: Self-pay | Admitting: Cardiology

## 2019-04-11 DIAGNOSIS — I5022 Chronic systolic (congestive) heart failure: Secondary | ICD-10-CM

## 2019-04-25 ENCOUNTER — Other Ambulatory Visit (HOSPITAL_COMMUNITY): Payer: Self-pay | Admitting: Cardiology

## 2019-05-28 ENCOUNTER — Other Ambulatory Visit (HOSPITAL_COMMUNITY): Payer: Self-pay | Admitting: Cardiology

## 2019-06-09 IMAGING — DX DG LUMBAR SPINE COMPLETE 4+V
5 series · 5 of 5 positions shown · non-contrast
Comparison: CT abdomen pelvis 02/15/2012.

CLINICAL DATA: Low back pain.

EXAM:
LUMBAR SPINE - COMPLETE 4+ VIEW

[l-spine ap]
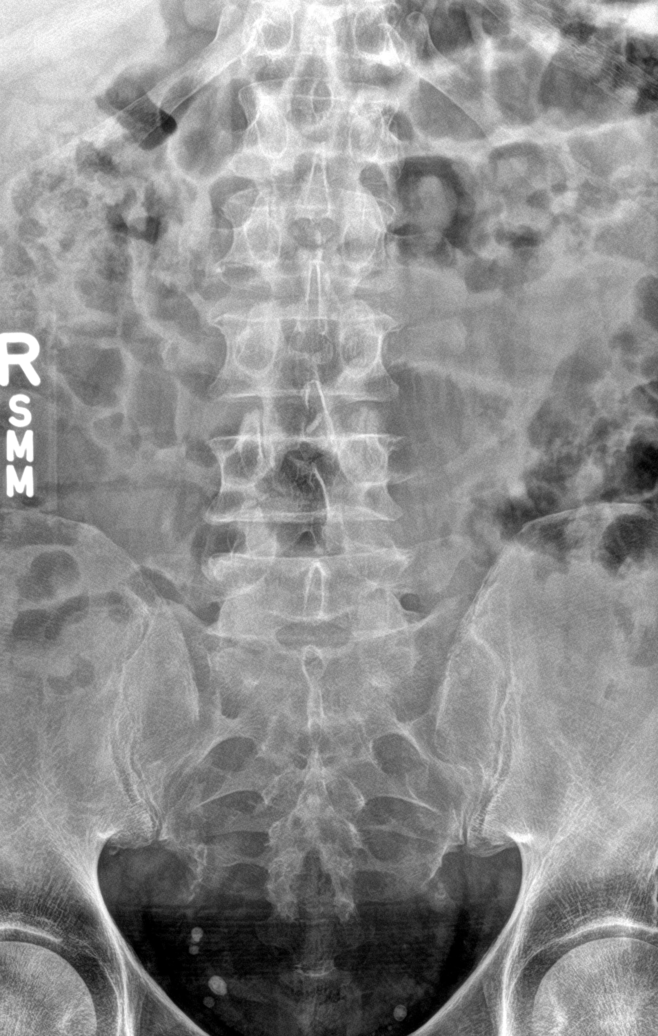

[l-spine obl (1 of 2)]
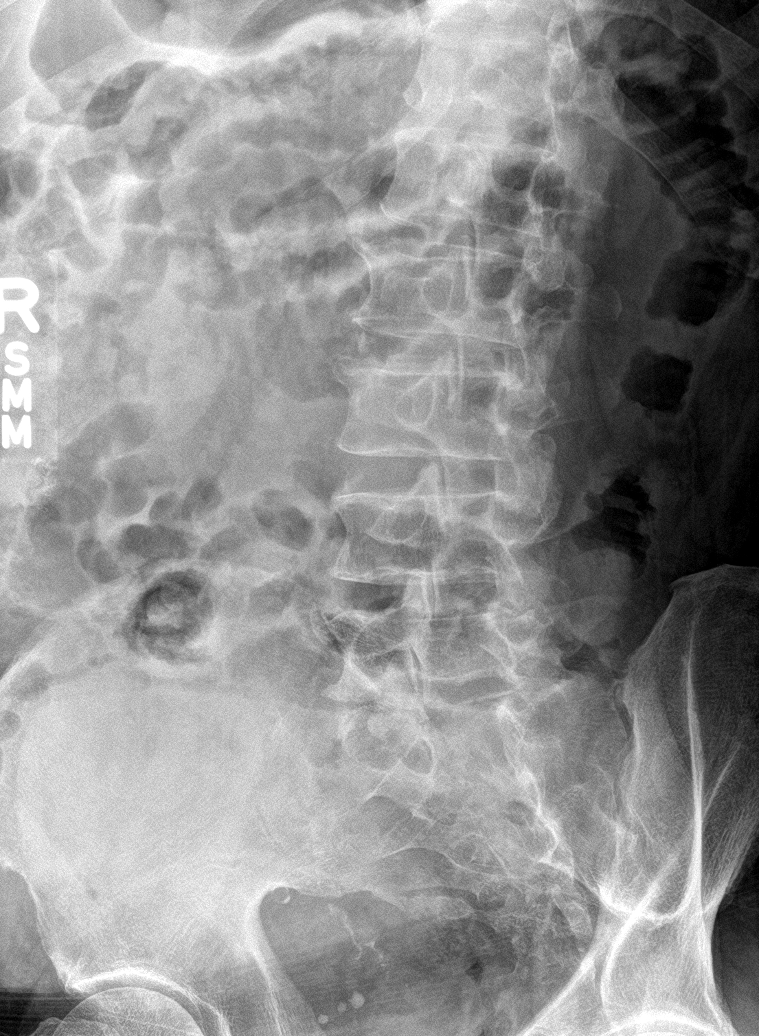

[l-spine obl (2 of 2)]
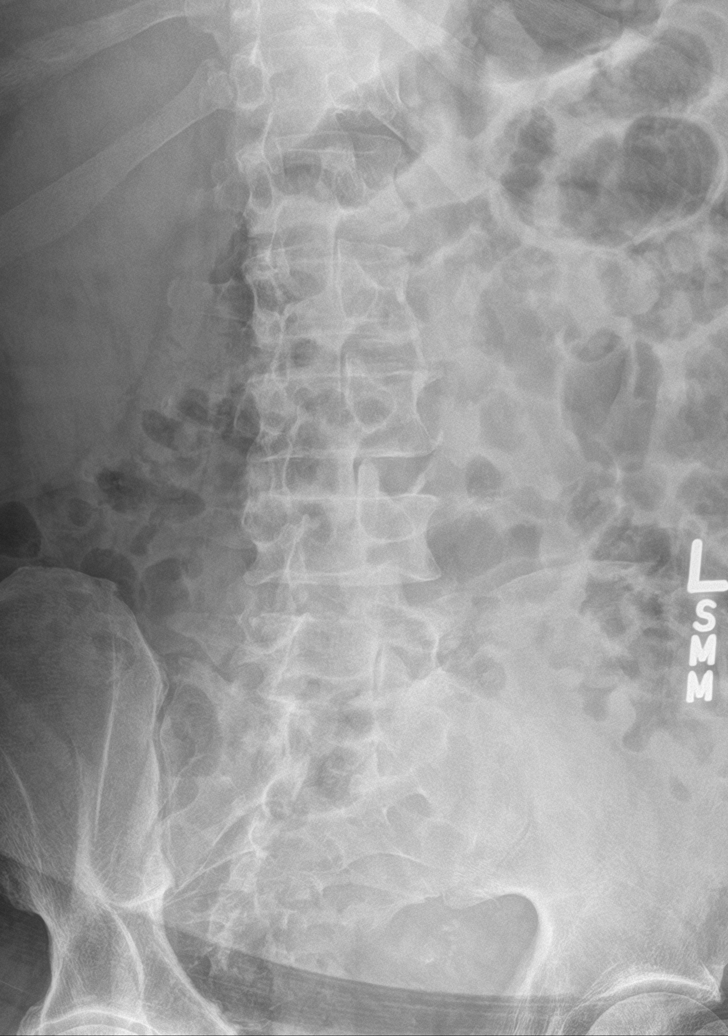

[l-spine lat]
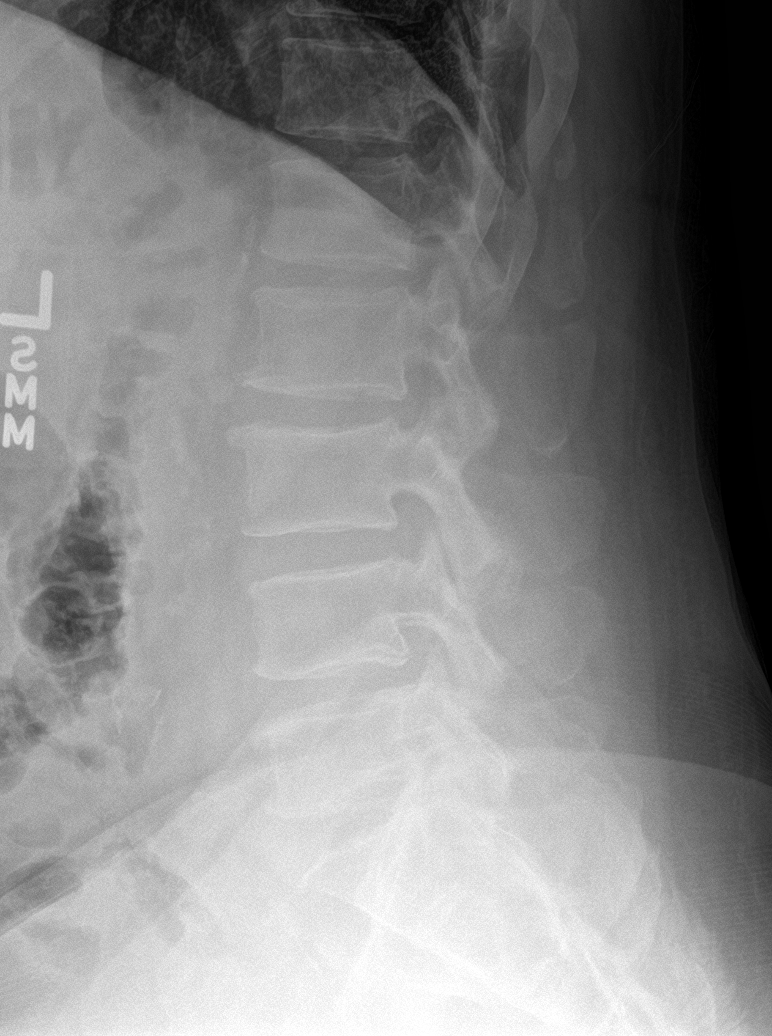

[l-spine spot]
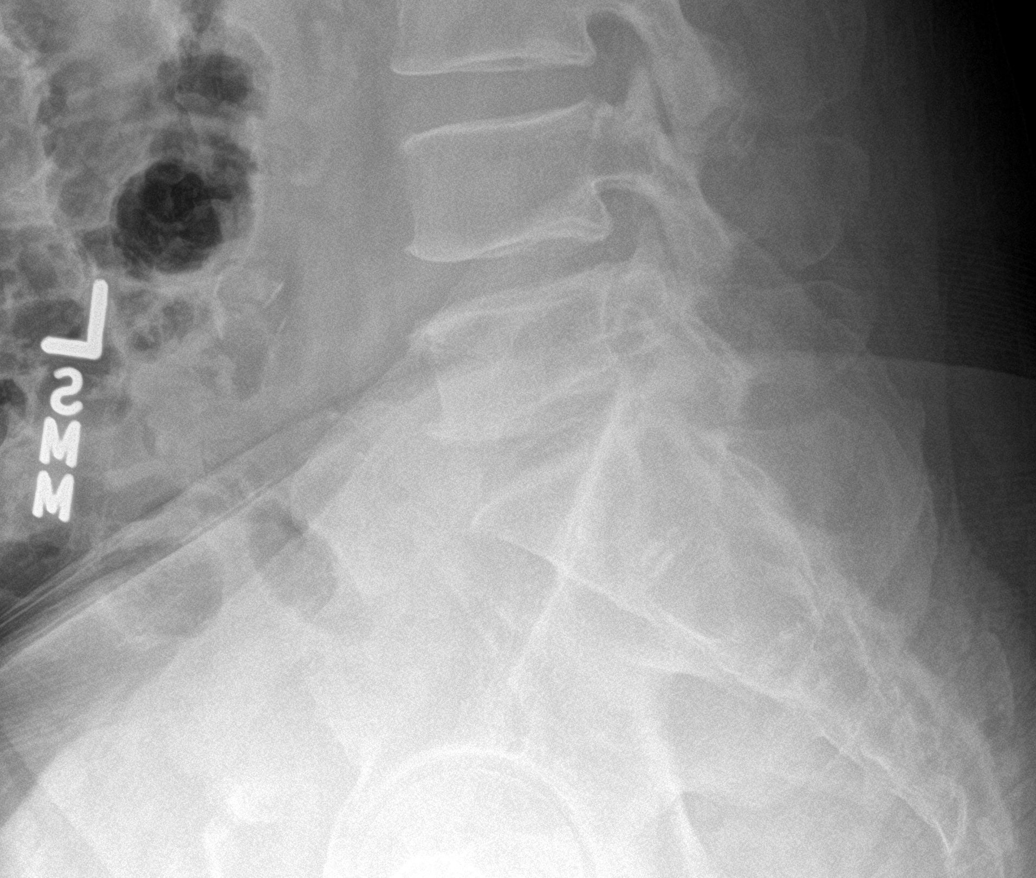

[5 of 5 positions shown; findings below may reference images not displayed]

FINDINGS: Vertebral body heights and alignment are stable. There straightening
of the normal lumbar lordosis. Moderate set hypertrophy has
progressed at L5-S1. Atherosclerotic changes are present in the
aorta without definite aneurysm.
IMPRESSION: 1. Moderate facet degenerative changes at L5-S[DATE] contribute to
foraminal disease.
2. No acute abnormality.

## 2019-08-08 ENCOUNTER — Other Ambulatory Visit (HOSPITAL_COMMUNITY): Payer: Self-pay | Admitting: Cardiology

## 2019-08-11 ENCOUNTER — Other Ambulatory Visit (HOSPITAL_COMMUNITY): Payer: Self-pay | Admitting: Cardiology

## 2019-10-03 ENCOUNTER — Other Ambulatory Visit: Payer: Self-pay | Admitting: Cardiology

## 2019-10-03 ENCOUNTER — Other Ambulatory Visit: Payer: Self-pay | Admitting: Family Medicine

## 2019-10-03 DIAGNOSIS — E785 Hyperlipidemia, unspecified: Secondary | ICD-10-CM

## 2019-11-06 ENCOUNTER — Other Ambulatory Visit (HOSPITAL_COMMUNITY): Payer: Self-pay | Admitting: Cardiology

## 2019-11-06 DIAGNOSIS — I5022 Chronic systolic (congestive) heart failure: Secondary | ICD-10-CM

## 2019-11-07 ENCOUNTER — Other Ambulatory Visit (HOSPITAL_COMMUNITY): Payer: Self-pay | Admitting: Cardiology

## 2020-04-21 ENCOUNTER — Other Ambulatory Visit (HOSPITAL_COMMUNITY): Payer: Self-pay | Admitting: Cardiology

## 2020-04-23 ENCOUNTER — Other Ambulatory Visit (HOSPITAL_COMMUNITY): Payer: Self-pay | Admitting: Internal Medicine

## 2020-05-10 ENCOUNTER — Ambulatory Visit (HOSPITAL_COMMUNITY)
Admission: RE | Admit: 2020-05-10 | Discharge: 2020-05-10 | Disposition: A | Discharge status: Home / Self Care | Payer: 59 | Source: Ambulatory Visit | Attending: Adult Health | Admitting: Adult Health

## 2020-05-10 ENCOUNTER — Encounter (HOSPITAL_COMMUNITY): Payer: Self-pay

## 2020-05-10 ENCOUNTER — Other Ambulatory Visit: Payer: Self-pay

## 2020-05-10 ENCOUNTER — Encounter (HOSPITAL_COMMUNITY): Payer: Self-pay | Admitting: Vascular Surgery

## 2020-05-10 VITALS — BP 124/86 | HR 68 | Wt 237.0 lb

## 2020-05-10 DIAGNOSIS — F1010 Alcohol abuse, uncomplicated: Secondary | ICD-10-CM

## 2020-05-10 DIAGNOSIS — I5022 Chronic systolic (congestive) heart failure: Secondary | ICD-10-CM

## 2020-05-10 DIAGNOSIS — Z716 Tobacco abuse counseling: Secondary | ICD-10-CM | POA: Insufficient documentation

## 2020-05-10 DIAGNOSIS — I428 Other cardiomyopathies: Secondary | ICD-10-CM | POA: Insufficient documentation

## 2020-05-10 DIAGNOSIS — I110 Hypertensive heart disease with heart failure: Secondary | ICD-10-CM | POA: Insufficient documentation

## 2020-05-10 DIAGNOSIS — I10 Essential (primary) hypertension: Secondary | ICD-10-CM

## 2020-05-10 DIAGNOSIS — Z79899 Other long term (current) drug therapy: Secondary | ICD-10-CM | POA: Diagnosis not present

## 2020-05-10 DIAGNOSIS — I5042 Chronic combined systolic (congestive) and diastolic (congestive) heart failure: Secondary | ICD-10-CM | POA: Insufficient documentation

## 2020-05-10 DIAGNOSIS — Z7289 Other problems related to lifestyle: Secondary | ICD-10-CM | POA: Insufficient documentation

## 2020-05-10 DIAGNOSIS — Z720 Tobacco use: Secondary | ICD-10-CM | POA: Insufficient documentation

## 2020-05-10 DIAGNOSIS — F17200 Nicotine dependence, unspecified, uncomplicated: Secondary | ICD-10-CM

## 2020-05-10 DIAGNOSIS — Z7982 Long term (current) use of aspirin: Secondary | ICD-10-CM | POA: Insufficient documentation

## 2020-05-10 LAB — BASIC METABOLIC PANEL
Anion gap: 7 (ref 5–15)
BUN: 12 mg/dL (ref 8–23)
CO2: 24 mmol/L (ref 22–32)
Calcium: 9.3 mg/dL (ref 8.9–10.3)
Chloride: 108 mmol/L (ref 98–111)
Creatinine, Ser: 1.06 mg/dL (ref 0.61–1.24)
GFR, Estimated: >60 mL/min (ref >60)
Glucose, Bld: 150 mg/dL — ABNORMAL HIGH (ref 70–99)
Potassium: 3.7 mmol/L (ref 3.5–5.1)
Sodium: 139 mmol/L (ref 135–145)

## 2020-05-10 NOTE — Progress Notes (Signed)
Patient ID: JAMERSON VONBARGEN, male   DOB: November 26, 1955, 65 y.o.   MRN: [id number]  PCP: None   Cardiology: Dr. Shirlee Latch  HPI: Mr. Butterfield is a 65 y.o. African  American male with a h/o severe HTN, tobacco and ETOH use,  NICM, chronic systolic HF.   Cath (11/11/10) which showed normal cors. EF 20% RA 7, RV 51/4, PA 61/30, with a mean of 47, PCWP 30.  Cardiac output was 4.0.  He was diuresed aggressively and started on lisinopril, spiro, carvedilol and lasix. His discharge weight was 99.8 kg.   Admitted to [facility] 11/22/2012 -11/25/12 with acute/chronic systolic heart failure in setting of medication noncompliance x several months and severe HTN. HF medications restarted. Repeat ECHO revealed  EF back down to 35-40% from 55%.  D/C weight was 214 pounds.   Today he returns for HF follow up.Overall feeling fine. Denies PND/Orthopnea. Mild SOB with steps. Appetite ok. No fever or chills. Smokes 1 pack every 2 weeks.  Drinking 6 beers on the weekend. Continues to work 2 jobs and walks 4 miles per day. Taking all medications  02/06/2011 ECHO 55-60% 11/22/12 ECHO  EF 35-40%  06/18/13 ECHO EF 50-55%  5/18 Echo: EF 55-60%, moderate LVH, normal RV size and systolic function. 2020 ECHO: EF 55-60%   ABIs (3/19): Normal.   ROS: All systems negative except as listed in HPI, PMH and Problem List.  Past Medical History:  Diagnosis Date  . CHF (congestive heart failure) (HCC)   . Diverticulosis   . Heart murmur   . History of cocaine abuse (HCC)    use to smoke crack cocaine, last use was more than 6 years ago  . Hypertension   . Rheumatic fever    as a child  . Systolic heart failure    a. 11/09/10 echo EF 35%, b. 11/11/10 cath norm cors c. ECHO (11/2012) EF 35-50%  . Umbilical hernia, incarcerated 11/2012   11/2012 exploratory lap with small bwl resection    Current Outpatient Medications  Medication Sig Dispense Refill  . aspirin EC 81 MG tablet Take 1 tablet (81 mg total) by mouth daily. 90 tablet 3  .  atorvastatin (LIPITOR) 20 MG tablet TAKE 1 TABLET (20 MG TOTAL) BY MOUTH DAILY AT 6 PM. 90 tablet 2  . carvedilol (COREG) 25 MG tablet TAKE ONE (1) TABLET BY MOUTH TWO (2) TIMES DAILY 180 tablet 3  . furosemide (LASIX) 40 MG tablet TAKE 1 TABLET (40 MG TOTAL) BY MOUTH DAILY. 30 tablet 6  . losartan (COZAAR) 100 MG tablet TAKE ONE-HALF (1/2) TABLET BY MOUTH DAILY, MUST HAVE AN APPT FOR FURTHER REFILLS 45 tablet 0  . potassium chloride SA (KLOR-CON) 20 MEQ tablet TAKE 1 TABLET (20 MEQ TOTAL) BY MOUTH DAILY. 30 tablet 6  . spironolactone (ALDACTONE) 25 MG tablet TAKE ONE (1) TABLET BY MOUTH EVERY DAY 90 tablet 3   Current Facility-Administered Medications  Medication Dose Route Frequency Provider Last Rate Last Admin  . 0.9 %  sodium chloride infusion  500 mL Intravenous Continuous [name], [name], MD       Vitals:   05/10/20 0837  BP: 124/86  Pulse: 68  SpO2: 98%  Weight: 107.5 kg (237 lb)   Wt Readings from Last 3 Encounters:  05/10/20 107.5 kg (237 lb)  01/23/19 107.6 kg (237 lb 4 oz)  04/24/17 114.8 kg (253 lb)    PHYSICAL EXAM: General:  Well appearing. No resp difficulty. Walked in the clininc HEENT: normal Neck:  supple. no JVD. Carotids 2+ bilat; no bruits. No lymphadenopathy or thryomegaly appreciated. Cor: PMI nondisplaced. Regular rate & rhythm. No rubs, gallops or murmurs. Lungs: clear Abdomen: soft, nontender, nondistended. No hepatosplenomegaly. No bruits or masses. Good bowel sounds. Extremities: no cyanosis, clubbing, rash, edema Neuro: alert & orientedx3, cranial nerves grossly intact. moves all 4 extremities w/o difficulty. Affect pleasant  EKG : SR with occasional PACs 70 bpm personally reviewed.   ASSESSMENT & PLAN:  1. Chronic Diastolic Heart Failure:  Nonischemic cardiomyopathy, possibly related to HTN.  - ECHO EF 55-60%  - NYHA II. Volume status stable.  - Continue lasix 40 mg daily and 25 mg spironolactone daily.  - Continue current Coreg 25 mg twice  a day.  - Continue losartan 50 mg daily - Check BMET today.  2. HTN: Stable.  3. Tobacco use: Discussed cessation  4. ETOH: Discussed cessation.   Refer to SW for PCP.  May be able to transition to general cardiology. EF has been 55-60% for the last several years. Follow up in 1 year.    Arletha Marschke NP-C  05/10/2020

## 2020-05-10 NOTE — Patient Instructions (Addendum)
It was so good to see you today! Labs done today. We will contact you only if your labs are abnormal.  No medication changes were made. Please continue all current medications as prescribed.  Your physician recommends that you schedule a follow-up appointment in: 1 year. Please contact our office in February 2023 for a March 2023 appointment.    If you have any questions or concerns before your next appointment please send [place] a message through [place] or call our office at [phone].    TO LEAVE A MESSAGE FOR THE NURSE SELECT OPTION 2, PLEASE LEAVE A MESSAGE INCLUDING: . YOUR NAME . DATE OF BIRTH . CALL BACK NUMBER . REASON FOR CALL**this is important as we prioritize the call backs  YOU WILL RECEIVE A CALL BACK THE SAME DAY AS LONG AS YOU CALL BEFORE 4:00 PM   Do the following things EVERYDAY: 1) Weigh yourself in the morning before breakfast. Write it down and keep it in a log. 2) Take your medicines as prescribed 3) Eat low salt foods--Limit salt (sodium) to 2000 mg per day.  4) Stay as active as you can everyday 5) Limit all fluids for the day to less than 2 liters   At the [facility], you and your health needs are our priority. As part of our continuing mission to provide you with exceptional heart care, we have created designated Provider Care Teams. These Care Teams include your primary Cardiologist (physician) and Advanced Practice Providers (APPs- Physician Assistants and Nurse Practitioners) who all work together to provide you with the care you need, when you need it.   You may see any of the following providers on your designated Care Team at your next follow up: Marland Kitchen Dr Arvilla Meres . Dr Marca Ancona . Tonye Becket, NP . Robbie Lis, PA . Karle Plumber, PharmD   Please be sure to bring in all your medications bottles to every appointment.

## 2020-05-11 ENCOUNTER — Telehealth (HOSPITAL_COMMUNITY): Payer: Self-pay | Admitting: Licensed Clinical Social Worker

## 2020-05-11 NOTE — Telephone Encounter (Signed)
CSW consulted to assist pt in obtaining PCP.  Pt PCP listed as Dr. Terisa Starr with [facility].  Pt states he was seeing her but had to change due to previous insurance putting him in network with [facility]- now insurance is changed to one in network with [facility] and he is agreeable to getting reestablished with [facility].  CSW called Family Medicine and got pt appt on 4/4- pt provided with appt information and reports no barriers to attending.  Will continue to follow through clinic and assist as needed  Burna Sis, LCSW Clinical Social Worker [facility] Desk#: [phone] Cell#: [phone]

## 2020-05-24 ENCOUNTER — Other Ambulatory Visit: Payer: Self-pay

## 2020-05-24 ENCOUNTER — Ambulatory Visit (INDEPENDENT_AMBULATORY_CARE_PROVIDER_SITE_OTHER): Payer: 59

## 2020-05-24 ENCOUNTER — Encounter: Payer: Self-pay | Admitting: Family Medicine

## 2020-05-24 ENCOUNTER — Ambulatory Visit (INDEPENDENT_AMBULATORY_CARE_PROVIDER_SITE_OTHER): Payer: 59 | Admitting: Family Medicine

## 2020-05-24 VITALS — BP 122/75 | HR 74 | Ht 71.0 in | Wt 237.4 lb

## 2020-05-24 DIAGNOSIS — Z23 Encounter for immunization: Secondary | ICD-10-CM

## 2020-05-24 DIAGNOSIS — Z720 Tobacco use: Secondary | ICD-10-CM | POA: Diagnosis not present

## 2020-05-24 DIAGNOSIS — E785 Hyperlipidemia, unspecified: Secondary | ICD-10-CM | POA: Diagnosis not present

## 2020-05-24 DIAGNOSIS — R7309 Other abnormal glucose: Secondary | ICD-10-CM | POA: Diagnosis not present

## 2020-05-24 DIAGNOSIS — F1010 Alcohol abuse, uncomplicated: Secondary | ICD-10-CM

## 2020-05-24 DIAGNOSIS — Z659 Problem related to unspecified psychosocial circumstances: Secondary | ICD-10-CM

## 2020-05-24 LAB — POCT GLYCOSYLATED HEMOGLOBIN (HGB A1C): Hemoglobin A1C: 5.4 % (ref 4.0–5.6)

## 2020-05-24 NOTE — Progress Notes (Addendum)
    SUBJECTIVE:   CHIEF COMPLAINT / HPI:   Shawn Perkins is a very pleasant 15 for a man with history significant for heart failure with now improved ejection fraction, dyslipidemia and tobacco abuse presenting today for routine follow-up.  The patient is reestablishing care after he was unable to see [place] for year due to [facility].  The patient would like his Covid vaccine today.  The patient reports 2 years ago a few days after his visit he had bright red blood per rectum for several days.  He denies symptoms of anemia including palpitations, chest pain, dyspnea on exertion (unchanged).  He is not particularly interested in undergoing a colonoscopy again at this time.  He reports that has not recurred in several years.  The patient continues to smoke about 1 pack of cigarettes per week.  He is previously smoked a pack and a half of cigarettes for as many as 30 years.  He denies chronic cough or hemoptysis.  He does not report chronic dyspnea on exertion as below.  He would consider a low-dose CT scan in the future with more information.  In terms of the patient's heart failure he is taking all of his prescriptions.  He reports they are affordable.  He denies side effects.  He does report chronic dyspnea on exertion after about 2-3 flights of stairs.  He denies chest pain or palpitations with this.  PERTINENT  PMH / PSH/Family/Social History : Reviewed and updated.  Reviewed prior metabolic panels which does demonstrate slight hyperglycemia.  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 Normal S1/S2. No murmurs, rubs, or gallops appreciated. Lungs: Clear bilaterally to ascultation.  Abdomen: Normoactive bowel sounds. Deferred (patient reports bleeding after last exam)  Psych: Pleasant and appropriate     ASSESSMENT/PLAN:   Tobacco abuse Discussed.  He is precontemplation about quitting.  We discussed options including gum, patches, lozenges and medications.  He will consider.  Follow-up in July.  Information on low-dose CT given.   Dyspnea on exertion, suspect this is multifactorial, has been ongoing for more than 30 years he reports.  Continue to monitor.  Consider further evaluation spirometry in future.  Hematochezia discussed recommendation to have repeat colonoscopy.  Patient declined at this time he will consider in future.  Repeat CBC at follow-up.  He previously had macrocytosis which I suspect was due to alcohol use.  Discussed that if this recurs he is to return to care immediately.  Dyslipidemia lipid panel today  Elevated blood glucose on recent metabolic panel A1C today.  HCM COVID vaccine given--patient reports significant transportation concerns, referral to CCM   At follow up- CT, CSY, and CBC     August, MD  Family Medicine Teaching Service  [facility]

## 2020-05-24 NOTE — Assessment & Plan Note (Signed)
Discussed.  He is precontemplation about quitting.  We discussed options including gum, patches, lozenges and medications.  He will consider.  Follow-up in July.  Information on low-dose CT given.

## 2020-05-24 NOTE — Patient Instructions (Addendum)
It was wonderful to see you today.  Please bring ALL of your medications with you to every visit.   Today we talked about:  - Smoking cessation---keep thinking about quitting   -- Lung cancer screening   -- Let me know if you have ANY bleeding  -- I will call you with blood work  -If your dyspnea changes, please call me   Follow up in June or July to check in    Thank you for choosing [facility].   Please call [phone] with any questions about today's appointment.  Please be sure to schedule follow up at the front  desk before you leave today.   Terisa Starr, MD  Family Medicine    Q: What is the goal of LDCT lung screening? The goal of LDCT lung screening is to detect lung cancer earlier. Without proactive screening and diagnosis, doctors don't usually find lung cancer until a person develops symptoms. By that time, the cancer is much harder to treat.  Q: Who should get an LDCT lung screening exam? LDCT lung screening is recommended for people who are at high risk for lung cancer. Screening before signs of lung cancer appear may lower the risk of death by as much as 20%. Those who have symptoms of a lung condition at the time of screening, such as a new cough or shortness of breath, are not eligible. They should see a physician immediately.  Q: Why does it matter if I have symptoms? Certain symptoms can be a sign that you have a condition in your lungs that should be evaluated and treated, if necessary, by your healthcare provider. These symptoms include fever, chest pain, a new or changing cough, shortness of breath that you have never felt before, coughing up blood or unexplained weight loss. If you have any of these symptoms, see your physician immediately.  Q: Who is considered high-risk for lung cancer? People ages 73 to 59 who have smoked at least an average of 1 pack a day for 30 years. This includes people who still smoke or have quit within the  past 15 years.  Q: If I meet the criteria for being high-risk but have been diagnosed with cancer in the past, is LDCT lung screening appropriate for me? It depends. In some cases, LDCT lung screening will not be appropriate, such as when your physician is already following your cancer with CT scan studies. Your physician will help determine if a low-dose CT scan is right for you.  Q: Do I need to have an LDCT lung screening exam every year? Yes. If you are high-risk, we recommend a lung cancer screening exam every year until you are 65 years old or it's been more than 15 years since you quit smoking.  Q: How effective is a low-dose CT scan at preventing death from lung cancer? Studies show that when compared to single-view chest X-ray screening, LDCT lung screening can lower the risk of death from lung cancer by 20% in people who are at risk.  Q: How is the exam performed? Low-dose CT is one of the easiest screening exams you can have. The exam takes less than 10 minutes. You don't need to take any medications and we don't use any needles. You can eat before and after the exam. You don't even need to change clothes as long as the clothing on your chest does not contain metal. You must, however, be able to hold your breath for at least  6 seconds while we take the chest scan.  Depending on the results, you may need continued screening or additional tests and treatments. We will send the results to your physician who placed the lung screening order.  Q: What are the risks of LDCT lung screening? There are several risks and limitations to using a low-dose CT scan. We want to make sure we have done a good job explaining these to you, so please let us know if you have any questions. A complete list of risks is included on the consent form. Your healthcare provider who ordered the screening may want to talk with you more about the risks listed below.  Radiation exposure: LDCT lung screening uses  radiation to create images of your lungs. Radiation can increase a person's risk of cancer. By using special techniques, the amount of radiation in LDCT lung screening is small - about the normal amount received from the sun in a year. Further, your physician has determined the benefits of the screening outweigh the risks of being exposed to the small amount of radiation from this exam. False negative: No test is perfect. It is possible that you may have a medical condition, including lung cancer, that the screening doesn't reveal. This is called a false negative. False positive/additional testing: A low-dose CT scan often finds something in the lung that could be cancer but in fact is not. This is called a false positive and can cause anxiety. To make sure these findings are not cancer, you may need to have more tests. These tests will be performed only if you give [place] permission. Occasionally, patients need a procedure, such as a biopsy, that can have potential side effects. Findings not related to lung cancer: Your LDCT lung screening exam also captures images of areas of your body next to your lungs. In a small percentage of cases, 5%-10%, the CT scan will show an abnormal finding in one of these areas, such as your kidneys, adrenal glands, liver, heart vessels or thyroid. This finding may not be serious, but you may need further evaluation. The healthcare provider who ordered your exam can help determine what, if any, additional testing you need.

## 2020-05-24 NOTE — Addendum Note (Signed)
Addended by: Manson Passey, Bruna Dills on: 05/24/2020 02:54 PM   Modules accepted: Orders

## 2020-05-25 ENCOUNTER — Telehealth: Payer: Self-pay | Admitting: Family Medicine

## 2020-05-25 LAB — LIPID PANEL
Chol/HDL Ratio: 3.2 ratio (ref 0.0–5.0)
Cholesterol, Total: 142 mg/dL (ref 100–199)
HDL: 45 mg/dL (ref >39)
LDL Chol Calc (NIH): 73 mg/dL (ref 0–99)
Triglycerides: 136 mg/dL (ref 0–149)
VLDL Cholesterol Cal: 24 mg/dL (ref 5–40)

## 2020-05-25 NOTE — Telephone Encounter (Signed)
Called with normal results--LDL 72, appropriate. Continue current therapy.  Terisa Starr, MD  Family Medicine Teaching Service

## 2020-05-27 ENCOUNTER — Telehealth: Payer: Self-pay

## 2020-05-27 NOTE — Telephone Encounter (Signed)
   Telephone encounter was:  Successful.  05/27/2020 Name: Shawn Perkins MRN: [id number] DOB: 09/03/1955  Shawn Perkins is a 65 y.o. year old male who is a primary care patient of Westley Chandler, MD . The community resource team was consulted for assistance with Transportation Needs   Care guide performed the following interventions: Patient provided with information about care guide support team and interviewed to confirm resource needs Discussed resources to assist with transportation Spoke with Traci at [facility] [employer] free transportation is provided for Covid vaccinations they are also provided transportation home after the appointment. Spoke with patient and gave him information to call for ride and he knows he will have to call at least one day in advance of his appointment. .  Follow Up Plan:  No further follow up planned at this time. The patient has been provided with needed resources.  Param Capri, AAS Paralegal, [facility] Care Guide . Embedded Care Coordination [facility]  Care Management  [address] [place], [place] [address] [email or web address]  ?? [phone]   [email or web address]

## 2020-06-14 ENCOUNTER — Ambulatory Visit (INDEPENDENT_AMBULATORY_CARE_PROVIDER_SITE_OTHER): Payer: 59

## 2020-06-14 ENCOUNTER — Other Ambulatory Visit: Payer: Self-pay

## 2020-06-14 DIAGNOSIS — Z23 Encounter for immunization: Secondary | ICD-10-CM

## 2020-06-15 ENCOUNTER — Other Ambulatory Visit (HOSPITAL_COMMUNITY): Payer: Self-pay

## 2020-06-15 ENCOUNTER — Other Ambulatory Visit (HOSPITAL_COMMUNITY): Payer: Self-pay | Admitting: Cardiology

## 2020-06-15 DIAGNOSIS — I5022 Chronic systolic (congestive) heart failure: Secondary | ICD-10-CM

## 2020-06-16 ENCOUNTER — Other Ambulatory Visit (HOSPITAL_COMMUNITY): Payer: Self-pay

## 2020-06-16 MED ORDER — POTASSIUM CHLORIDE CRYS ER 20 MEQ PO TBCR
20.0000 meq | EXTENDED_RELEASE_TABLET | Freq: Every day | ORAL | 3 refills | Status: DC
  Filled 2020-06-16: qty 90, 90d supply, fill #0
  Filled 2020-06-16: qty 30, 30d supply, fill #0
  Filled 2020-07-15: qty 30, 30d supply, fill #1
  Filled 2020-08-13: qty 30, 30d supply, fill #2
  Filled 2020-09-14: qty 30, 30d supply, fill #3
  Filled 2020-10-13: qty 90, 90d supply, fill #0

## 2020-06-16 MED ORDER — SPIRONOLACTONE 25 MG PO TABS
ORAL_TABLET | Freq: Every day | ORAL | 3 refills | Status: DC
  Filled 2020-06-16: qty 30, 30d supply, fill #0
  Filled 2020-06-16: qty 90, 90d supply, fill #0
  Filled 2020-07-15: qty 30, 30d supply, fill #1
  Filled 2020-08-13: qty 30, 30d supply, fill #2
  Filled 2020-09-14: qty 30, 30d supply, fill #3
  Filled 2020-10-13: qty 90, 90d supply, fill #0

## 2020-06-16 MED ORDER — FUROSEMIDE 40 MG PO TABS
40.0000 mg | ORAL_TABLET | Freq: Every day | ORAL | 3 refills | Status: DC
  Filled 2020-06-16: qty 90, 90d supply, fill #0
  Filled 2020-09-14: qty 90, 90d supply, fill #1
  Filled 2020-12-16: qty 90, 90d supply, fill #0
  Filled 2021-03-17: qty 30, 30d supply, fill #0
  Filled 2021-04-12: qty 30, 30d supply, fill #1
  Filled 2021-05-12: qty 30, 30d supply, fill #2

## 2020-06-22 ENCOUNTER — Other Ambulatory Visit (HOSPITAL_COMMUNITY): Payer: Self-pay

## 2020-06-22 MED FILL — Atorvastatin Calcium Tab 20 MG (Base Equivalent): ORAL | 90 days supply | Qty: 90 | Fill #0 | Status: AC

## 2020-06-30 ENCOUNTER — Other Ambulatory Visit (HOSPITAL_COMMUNITY): Payer: Self-pay

## 2020-06-30 MED FILL — Carvedilol Tab 25 MG: ORAL | 90 days supply | Qty: 180 | Fill #0 | Status: AC

## 2020-07-15 ENCOUNTER — Other Ambulatory Visit (HOSPITAL_COMMUNITY): Payer: Self-pay

## 2020-07-22 ENCOUNTER — Other Ambulatory Visit (HOSPITAL_COMMUNITY): Payer: Self-pay

## 2020-07-22 ENCOUNTER — Other Ambulatory Visit (HOSPITAL_COMMUNITY): Payer: Self-pay | Admitting: Internal Medicine

## 2020-07-22 MED ORDER — LOSARTAN POTASSIUM 100 MG PO TABS
50.0000 mg | ORAL_TABLET | Freq: Every day | ORAL | 3 refills | Status: DC
  Filled 2020-07-22 – 2020-10-13 (×2): qty 45, 90d supply, fill #0

## 2020-08-13 ENCOUNTER — Other Ambulatory Visit (HOSPITAL_COMMUNITY): Payer: Self-pay

## 2020-09-14 ENCOUNTER — Other Ambulatory Visit (HOSPITAL_COMMUNITY): Payer: Self-pay

## 2020-10-13 ENCOUNTER — Other Ambulatory Visit (HOSPITAL_COMMUNITY): Payer: Self-pay | Admitting: Cardiology

## 2020-10-13 ENCOUNTER — Other Ambulatory Visit (HOSPITAL_COMMUNITY): Payer: Self-pay

## 2020-10-13 MED ORDER — CARVEDILOL 25 MG PO TABS
25.0000 mg | ORAL_TABLET | Freq: Two times a day (BID) | ORAL | 3 refills | Status: DC
  Filled 2020-10-13: qty 180, 90d supply, fill #0
  Filled 2021-01-31: qty 180, 90d supply, fill #1
  Filled 2021-05-12: qty 180, 90d supply, fill #2
  Filled 2021-09-12: qty 180, 90d supply, fill #3

## 2020-11-04 ENCOUNTER — Other Ambulatory Visit: Payer: Self-pay

## 2020-11-04 ENCOUNTER — Other Ambulatory Visit (HOSPITAL_COMMUNITY): Payer: Self-pay

## 2020-11-04 ENCOUNTER — Other Ambulatory Visit: Payer: Self-pay | Admitting: Family Medicine

## 2020-11-04 DIAGNOSIS — E785 Hyperlipidemia, unspecified: Secondary | ICD-10-CM

## 2020-11-05 ENCOUNTER — Other Ambulatory Visit (HOSPITAL_COMMUNITY): Payer: Self-pay

## 2020-11-05 ENCOUNTER — Other Ambulatory Visit: Payer: Self-pay

## 2020-11-08 ENCOUNTER — Other Ambulatory Visit (HOSPITAL_COMMUNITY): Payer: Self-pay

## 2020-11-08 ENCOUNTER — Other Ambulatory Visit: Payer: Self-pay

## 2020-11-08 DIAGNOSIS — E785 Hyperlipidemia, unspecified: Secondary | ICD-10-CM

## 2020-11-08 MED ORDER — ATORVASTATIN CALCIUM 20 MG PO TABS
20.0000 mg | ORAL_TABLET | Freq: Every day | ORAL | 2 refills | Status: DC
  Filled 2020-11-08: qty 90, 90d supply, fill #0
  Filled 2021-03-17: qty 30, 30d supply, fill #0
  Filled 2021-05-04: qty 30, 30d supply, fill #1
  Filled 2021-06-14: qty 30, 30d supply, fill #2
  Filled 2021-08-12: qty 30, 30d supply, fill #3
  Filled 2021-10-12: qty 30, 30d supply, fill #4

## 2020-11-10 ENCOUNTER — Other Ambulatory Visit (HOSPITAL_COMMUNITY): Payer: Self-pay

## 2020-12-16 ENCOUNTER — Other Ambulatory Visit (HOSPITAL_COMMUNITY): Payer: Self-pay

## 2021-01-10 ENCOUNTER — Other Ambulatory Visit: Payer: Self-pay

## 2021-01-10 ENCOUNTER — Ambulatory Visit (INDEPENDENT_AMBULATORY_CARE_PROVIDER_SITE_OTHER): Payer: 59

## 2021-01-10 ENCOUNTER — Ambulatory Visit (INDEPENDENT_AMBULATORY_CARE_PROVIDER_SITE_OTHER): Payer: 59 | Admitting: Family Medicine

## 2021-01-10 ENCOUNTER — Other Ambulatory Visit (HOSPITAL_COMMUNITY): Payer: Self-pay

## 2021-01-10 ENCOUNTER — Encounter: Payer: Self-pay | Admitting: Family Medicine

## 2021-01-10 VITALS — BP 120/72 | HR 82 | Wt 249.8 lb

## 2021-01-10 DIAGNOSIS — I10 Essential (primary) hypertension: Secondary | ICD-10-CM

## 2021-01-10 DIAGNOSIS — Z720 Tobacco use: Secondary | ICD-10-CM | POA: Diagnosis not present

## 2021-01-10 DIAGNOSIS — Z23 Encounter for immunization: Secondary | ICD-10-CM | POA: Diagnosis not present

## 2021-01-10 DIAGNOSIS — Z136 Encounter for screening for cardiovascular disorders: Secondary | ICD-10-CM

## 2021-01-10 DIAGNOSIS — D7589 Other specified diseases of blood and blood-forming organs: Secondary | ICD-10-CM

## 2021-01-10 DIAGNOSIS — M25476 Effusion, unspecified foot: Secondary | ICD-10-CM

## 2021-01-10 DIAGNOSIS — Z0000 Encounter for general adult medical examination without abnormal findings: Secondary | ICD-10-CM

## 2021-01-10 DIAGNOSIS — I5022 Chronic systolic (congestive) heart failure: Secondary | ICD-10-CM | POA: Diagnosis not present

## 2021-01-10 MED ORDER — POTASSIUM CHLORIDE CRYS ER 20 MEQ PO TBCR
20.0000 meq | EXTENDED_RELEASE_TABLET | Freq: Every day | ORAL | 3 refills | Status: DC
  Filled 2021-01-10: qty 90, 90d supply, fill #0
  Filled 2021-04-12: qty 30, 30d supply, fill #0
  Filled 2021-05-12: qty 30, 30d supply, fill #1
  Filled 2021-06-14: qty 30, 30d supply, fill #2
  Filled 2021-07-13: qty 30, 30d supply, fill #3
  Filled 2021-08-12: qty 30, 30d supply, fill #4
  Filled 2021-09-12: qty 30, 30d supply, fill #5
  Filled 2021-10-12: qty 30, 30d supply, fill #6

## 2021-01-10 MED ORDER — SPIRONOLACTONE 25 MG PO TABS
25.0000 mg | ORAL_TABLET | Freq: Every day | ORAL | 3 refills | Status: DC
  Filled 2021-01-10: qty 90, 90d supply, fill #0
  Filled 2021-04-12: qty 30, 30d supply, fill #0
  Filled 2021-05-12: qty 30, 30d supply, fill #1
  Filled 2021-06-14: qty 30, 30d supply, fill #2
  Filled 2021-07-13: qty 30, 30d supply, fill #3
  Filled 2021-08-12: qty 30, 30d supply, fill #4
  Filled 2021-09-12: qty 30, 30d supply, fill #5
  Filled 2021-10-12: qty 30, 30d supply, fill #6

## 2021-01-10 MED ORDER — LOSARTAN POTASSIUM 100 MG PO TABS
50.0000 mg | ORAL_TABLET | Freq: Every day | ORAL | 3 refills | Status: DC
  Filled 2021-01-10: qty 45, 90d supply, fill #0
  Filled 2021-04-12: qty 15, 30d supply, fill #0
  Filled 2021-05-12: qty 15, 30d supply, fill #1
  Filled 2021-06-14: qty 15, 30d supply, fill #2
  Filled 2021-07-13: qty 15, 30d supply, fill #3
  Filled 2021-08-12: qty 15, 30d supply, fill #4
  Filled 2021-09-12: qty 15, 30d supply, fill #5
  Filled 2021-10-12: qty 15, 30d supply, fill #6

## 2021-01-10 NOTE — Progress Notes (Signed)
   Covid-19 Vaccination Clinic  Name:  Shawn Perkins    MRN: [id number] DOB: 1955/03/28  01/10/2021  [name] was observed post Covid-19 immunization for 15 minutes without incident. He was provided with Vaccine Information Sheet and instruction to access the V-Safe system.   [name] was instructed to call 911 with any severe reactions post vaccine: Difficulty breathing  Swelling of face and throat  A fast heartbeat  A bad rash all over body  Dizziness and weakness

## 2021-01-10 NOTE — Patient Instructions (Addendum)
It was wonderful to see you today.  Please bring ALL of your medications with you to every visit.   Today we talked about:  CONGRATULATIONS ON SMOKING CESSATION  To reduce snacking try sugar free lollipops, celery, carrrots, gumm  I sent in your refills  -- I will call you with blood work  For your foot you can try Diclofenac (voltaren) gel over the counter   An x-ray was ordered for you---you do not need an appointment to have this completed.  I recommend going to [facility] [address] [place] [address] [place]    If the results are normal,I will send you a letter  I will call you with results if anything is abnormal     You have an ultrasound of your aorta scheduled to look for widening (dilation, called an aneurysm)   Tobacco use is damaging to your body. It increases your risk of stroke, heart attack, lung cancer, and serious lung disease in the future. It also reduces your fertility.   Quitting tobacco is the best thing for your health but is a challenge---nicotine, a chemical in cigarettes, is highly addictive.   You can call 1 800 QUIT NOW ([phone])---you will be connected with a Careers information officer. They can also mail you nicotine gums, lozenges, and patches to quit.   Ask me about patches (which you wear all day) and gums (which you use when you have a craving) to help you quit.   There are safe, effective medications to help you quit--  Varencline---also called Chantix---- is the most common medication used to help people stop smoking. It starts a low dose and is increased. I recommended choosing a quit date then starting the medication 8 days before this. Side effects include mild headache, difficulty sleeping, and odd dreams. The medication is typically very well tolerated.     Bupropion---also called Zyban---- is started 1 week before your quit date. You take 1 pill for three days then increase to 1  pill twice per day. Side effects include a mild headache and anxiety---this usually goes away. Some patients experience weight loss.       Thank you for choosing [facility].   Please call [phone] with any questions about today's appointment.  Please be sure to schedule follow up at the front  desk before you leave today.   Terisa Starr, MD  Family Medicine

## 2021-01-10 NOTE — Assessment & Plan Note (Signed)
Metabolic panel today. 

## 2021-01-10 NOTE — Progress Notes (Signed)
    SUBJECTIVE:   Chief compliant/HPI: annual examination  Shawn Perkins is a 65 y.o. who presents today for an annual exam.    He reports right toe pain.  Approximately 2 weeks ago he was bending down to fix a door noted after that his right great metatarsal phalangeal joint became red and swollen.  It has significantly subsided.  Pain was worse at first.  He said 2 prior episodes that were similar.  Denies hand symptoms or other joint symptoms.  Denies trauma to the area.  He did not drop anything on it.  This improved over time with some oral therapies over-the-counter.  He denies fever or chills at the area.  The patient reports compliance with his medications.  He denies chest pain, headaches, dyspnea on exertion, abdominal pain nausea vomiting melena or hematochezia. Patient is recently quit smoking as of 2 weeks ago.  He is very pleased with this.  He has noticed increase in his weight due to snacking. History tabs reviewed and updated reviewed and appropriate..   Review of systems form reviewed and notable for none  OBJECTIVE:   BP 120/72   Pulse 82   Wt 249 lb 12.8 oz (113.3 kg)   SpO2 98%   BMI 34.84 kg/m   HEENT: EOMI. Sclera without injection or icterus. MMM. External auditory canal examined and WNL. TM normal appearance, no erythema or bulging. Neck: Supple.  Cardiac: Regular rate and rhythm. Normal S1/S2. No murmurs, rubs, or gallops appreciated. Lungs: Clear bilaterally to ascultation.  Abdomen: Normoactive bowel sounds. No tenderness to deep or light palpation. No rebound or guarding.  MSK both feet are examined.  Right great metatarsophalangeal joint is mildly erythematous without overlying warmth.  There is no skin breakdown there is no significant deformity.  There is pain with range of motion of the right great toe as compared to the left and some crepitus on exam.  No tophi noted. Psych: Pleasant and appropriate    ASSESSMENT/PLAN:   Tobacco abuse AAA screening  ordered, will schedule. Has a history of tobacco use.  Will continue to discuss low-dose CT for patient. He is amenable to the AAA ultrasound today. Congratulated on smoking cessation.  Essential hypertension Metabolic panel today.   Macrocytosis, previously noted.  Repeat CBC folate B12 today.  Right great toe pain, suspect this is due to gout, differential also includes extensor tendinitis or flare of osteoarthritis.  Most likely this is a gouty arthritis.  Will obtain uric acid and x-ray.  Also considered septic arthritis but this is markedly less likely given improvement, lack of warmth and systemic symptoms that are absent.  Annual Examination  See AVS for age appropriate recommendations  BP reviewed and at goal  Considered the following items based upon USPSTF recommendations: As above reviewed AAA screening and low-dose CT.  This is based upon his smoking history.  Ultrasound of abdomen ordered for aneurysm screening.  Vaccinations COVID, influenza and pneumococcal vaccines given.  Follow up in 1 year or sooner if indicated.  Address low-dose CT at follow-up.   Westley Chandler, MD [facility]

## 2021-01-10 NOTE — Assessment & Plan Note (Addendum)
AAA screening ordered, will schedule. Has a history of tobacco use.  Will continue to discuss low-dose CT for patient. He is amenable to the AAA ultrasound today. Congratulated on smoking cessation.

## 2021-01-11 LAB — COMPREHENSIVE METABOLIC PANEL
ALT: 13 IU/L (ref 0–44)
AST: 17 IU/L (ref 0–40)
Albumin/Globulin Ratio: 1.8 (ref 1.2–2.2)
Albumin: 4.7 g/dL (ref 3.8–4.8)
Alkaline Phosphatase: 125 IU/L — ABNORMAL HIGH (ref 44–121)
BUN/Creatinine Ratio: 12 (ref 10–24)
BUN: 12 mg/dL (ref 8–27)
Bilirubin Total: 0.4 mg/dL (ref 0.0–1.2)
CO2: 23 mmol/L (ref 20–29)
Calcium: 9.5 mg/dL (ref 8.6–10.2)
Chloride: 105 mmol/L (ref 96–106)
Creatinine, Ser: 1.01 mg/dL (ref 0.76–1.27)
Globulin, Total: 2.6 g/dL (ref 1.5–4.5)
Glucose: 188 mg/dL — ABNORMAL HIGH (ref 70–99)
Potassium: 4.7 mmol/L (ref 3.5–5.2)
Sodium: 143 mmol/L (ref 134–144)
Total Protein: 7.3 g/dL (ref 6.0–8.5)
eGFR: 83 mL/min/{1.73_m2} (ref >59)

## 2021-01-11 LAB — CBC
Hematocrit: 42.3 % (ref 37.5–51.0)
Hemoglobin: 14.4 g/dL (ref 13.0–17.7)
MCH: 34 pg — ABNORMAL HIGH (ref 26.6–33.0)
MCHC: 34 g/dL (ref 31.5–35.7)
MCV: 100 fL — ABNORMAL HIGH (ref 79–97)
Platelets: 204 10*3/uL (ref 150–450)
RBC: 4.23 x10E6/uL (ref 4.14–5.80)
RDW: 12.1 % (ref 11.6–15.4)
WBC: 8.2 10*3/uL (ref 3.4–10.8)

## 2021-01-11 LAB — FOLATE: Folate: 10.8 ng/mL (ref >3.0)

## 2021-01-11 LAB — VITAMIN B12: Vitamin B-12: 176 pg/mL — ABNORMAL LOW (ref 232–1245)

## 2021-01-11 LAB — URIC ACID: Uric Acid: 8.3 mg/dL (ref 3.8–8.4)

## 2021-01-12 ENCOUNTER — Telehealth: Payer: Self-pay | Admitting: Family Medicine

## 2021-01-12 ENCOUNTER — Other Ambulatory Visit (HOSPITAL_COMMUNITY): Payer: Self-pay

## 2021-01-12 DIAGNOSIS — E538 Deficiency of other specified B group vitamins: Secondary | ICD-10-CM

## 2021-01-12 MED ORDER — VITAMIN B-12 1000 MCG PO TABS
1000.0000 ug | ORAL_TABLET | Freq: Every day | ORAL | 3 refills | Status: AC
Start: 2021-01-12 — End: ?
  Filled 2021-01-12: qty 90, 90d supply, fill #0

## 2021-01-12 NOTE — Telephone Encounter (Signed)
Called with results.  B12 is low.  This is likely cause of macrocytosis. no dietary changes or symptoms.  We will continue to monitor repeat in 2 to 3 months.  Supplementation of 1000 mcg daily started orally.  Also elevated glucose we will repeat A1c at follow-up.  Uric acid is on the upper limit of normal.  He will call or return to care if his symptoms persist I do suspect he has gouty arthritis of his right great toe.

## 2021-01-17 ENCOUNTER — Ambulatory Visit (HOSPITAL_COMMUNITY): Payer: 59

## 2021-01-31 ENCOUNTER — Other Ambulatory Visit (HOSPITAL_COMMUNITY): Payer: Self-pay

## 2021-02-28 ENCOUNTER — Other Ambulatory Visit: Payer: Self-pay

## 2021-03-17 ENCOUNTER — Other Ambulatory Visit: Payer: Self-pay

## 2021-04-12 ENCOUNTER — Other Ambulatory Visit: Payer: Self-pay

## 2021-05-04 ENCOUNTER — Other Ambulatory Visit: Payer: Self-pay

## 2021-05-12 ENCOUNTER — Other Ambulatory Visit: Payer: Self-pay

## 2021-06-14 ENCOUNTER — Other Ambulatory Visit: Payer: Self-pay

## 2021-06-14 ENCOUNTER — Other Ambulatory Visit (HOSPITAL_COMMUNITY): Payer: Self-pay | Admitting: Cardiology

## 2021-06-14 DIAGNOSIS — I5022 Chronic systolic (congestive) heart failure: Secondary | ICD-10-CM

## 2021-06-14 MED ORDER — FUROSEMIDE 40 MG PO TABS
40.0000 mg | ORAL_TABLET | Freq: Every day | ORAL | 1 refills | Status: DC
  Filled 2021-06-14: qty 90, 90d supply, fill #0
  Filled 2021-09-12: qty 90, 90d supply, fill #1

## 2021-07-13 ENCOUNTER — Other Ambulatory Visit: Payer: Self-pay

## 2021-08-12 ENCOUNTER — Other Ambulatory Visit: Payer: Self-pay

## 2021-09-12 ENCOUNTER — Other Ambulatory Visit: Payer: Self-pay

## 2021-10-12 ENCOUNTER — Other Ambulatory Visit: Payer: Self-pay

## 2021-11-08 ENCOUNTER — Other Ambulatory Visit: Payer: Self-pay

## 2021-11-08 MED ORDER — LOSARTAN POTASSIUM 100 MG PO TABS
50.0000 mg | ORAL_TABLET | Freq: Every day | ORAL | 0 refills | Status: DC
Start: 2021-11-08 — End: 2022-01-24

## 2021-11-08 MED ORDER — SPIRONOLACTONE 25 MG PO TABS: 25.0000 mg | ORAL_TABLET | Freq: Every day | ORAL | 0 refills | Status: DC

## 2021-11-08 MED ORDER — POTASSIUM CHLORIDE CRYS ER 20 MEQ PO TBCR: 20.0000 meq | EXTENDED_RELEASE_TABLET | Freq: Every day | ORAL | 0 refills | Status: DC

## 2021-12-12 ENCOUNTER — Other Ambulatory Visit: Payer: Self-pay

## 2021-12-12 DIAGNOSIS — E785 Hyperlipidemia, unspecified: Secondary | ICD-10-CM

## 2021-12-12 DIAGNOSIS — I5022 Chronic systolic (congestive) heart failure: Secondary | ICD-10-CM

## 2021-12-12 MED ORDER — ATORVASTATIN CALCIUM 20 MG PO TABS: 20.0000 mg | ORAL_TABLET | Freq: Every day | ORAL | 2 refills | Status: DC

## 2021-12-12 MED ORDER — FUROSEMIDE 40 MG PO TABS: 40.0000 mg | ORAL_TABLET | Freq: Every day | ORAL | 1 refills | Status: DC

## 2022-01-20 ENCOUNTER — Other Ambulatory Visit: Payer: Self-pay

## 2022-01-20 MED ORDER — CARVEDILOL 25 MG PO TABS: 25.0000 mg | ORAL_TABLET | Freq: Two times a day (BID) | ORAL | 0 refills | Status: DC

## 2022-01-24 ENCOUNTER — Other Ambulatory Visit: Payer: Self-pay | Admitting: *Deleted

## 2022-01-24 MED ORDER — SPIRONOLACTONE 25 MG PO TABS: 25.0000 mg | ORAL_TABLET | Freq: Every day | ORAL | 0 refills | Status: DC

## 2022-01-24 MED ORDER — LOSARTAN POTASSIUM 100 MG PO TABS: 50.0000 mg | ORAL_TABLET | Freq: Every day | ORAL | 0 refills | Status: DC

## 2022-01-24 MED ORDER — POTASSIUM CHLORIDE CRYS ER 20 MEQ PO TBCR: 20.0000 meq | EXTENDED_RELEASE_TABLET | Freq: Every day | ORAL | 0 refills | Status: DC

## 2022-02-08 ENCOUNTER — Telehealth: Payer: Self-pay | Admitting: Family Medicine

## 2022-02-08 ENCOUNTER — Other Ambulatory Visit: Payer: Self-pay

## 2022-02-08 MED ORDER — LOSARTAN POTASSIUM 100 MG PO TABS: 50.0000 mg | ORAL_TABLET | Freq: Every day | ORAL | 0 refills | Status: DC

## 2022-02-08 MED ORDER — POTASSIUM CHLORIDE CRYS ER 20 MEQ PO TBCR: 20.0000 meq | EXTENDED_RELEASE_TABLET | Freq: Every day | ORAL | 0 refills | Status: DC

## 2022-02-08 MED ORDER — SPIRONOLACTONE 25 MG PO TABS: 25.0000 mg | ORAL_TABLET | Freq: Every day | ORAL | 0 refills | Status: DC

## 2022-02-08 NOTE — Telephone Encounter (Signed)
Received refill request. Last labs 1 year ago.  Please call patient and schedule appointment in next 1-2 weeks for visit with any provider. He will need blood work before additional refills.   Terisa Starr, MD  Family Medicine Teaching Service

## 2022-03-06 ENCOUNTER — Other Ambulatory Visit: Payer: Self-pay

## 2022-03-07 NOTE — Telephone Encounter (Signed)
Patient needs to come in for an appointment before this medication can be safely refilled. He needs labs. Declining rx for now.  Thanks, Leeanne Rio, MD

## 2022-03-08 NOTE — Telephone Encounter (Signed)
Called and spoke to patient.  Patient states that he has retired and his insurance has been "messed up" and currently does not cover his medication.  Patient is working with Medicare to get the issue resolved.    Patient is out of his medication and would like it filled in the interim to give him time to resolve the issue with his insurance.  Patient is willing to make appointment when he resolves issue. Appointment offered today but declined.  Ozella Almond, [place]

## 2022-03-09 MED ORDER — POTASSIUM CHLORIDE CRYS ER 20 MEQ PO TBCR: 20.0000 meq | EXTENDED_RELEASE_TABLET | Freq: Every day | ORAL | 0 refills | Status: DC

## 2022-03-09 MED ORDER — SPIRONOLACTONE 25 MG PO TABS: 25.0000 mg | ORAL_TABLET | Freq: Every day | ORAL | 0 refills | Status: DC

## 2022-03-09 NOTE — Telephone Encounter (Signed)
Rx sent in for 10 days. He must come in for labs before we can refill this again.  Leeanne Rio, MD

## 2022-03-09 NOTE — Addendum Note (Signed)
Addended by: Leeanne Rio on: 03/09/2022 12:47 PM   Modules accepted: Orders

## 2022-03-13 ENCOUNTER — Other Ambulatory Visit: Payer: Self-pay | Admitting: *Deleted

## 2022-03-21 NOTE — Patient Instructions (Incomplete)
It was nice seeing you today!  Work on quitting smoking again!  Think about lung cancer screening.  Make sure to schedule follow-up with the heart doctor. Cardiology office number: [phone]  Stay well, Littie Deeds, MD [facility] [phone]  --  Make sure to check out at the front desk before you leave today.  Please arrive at least 15 minutes prior to your scheduled appointments.  If you had blood work today, I will send you a MyChart message or a letter if results are normal. Otherwise, I will give you a call.  If you had a referral placed, they will call you to set up an appointment. Please give [place] a call if you don't hear back in the next 2 weeks.  If you need additional refills before your next appointment, please call your pharmacy first.

## 2022-03-21 NOTE — Progress Notes (Signed)
    SUBJECTIVE:   CHIEF COMPLAINT / HPI:  No chief complaint on file.   ***  PERTINENT  PMH / PSH: HTN, CHF, alcohol abuse, obesity, tobacco abuse  Patient Care Team: Martyn Malay, MD as PCP - General (Family Medicine)   OBJECTIVE:   There were no vitals taken for this visit.  Physical Exam      07/05/2018    8:27 AM  Depression screen PHQ 2/9  Decreased Interest 0  Down, Depressed, Hopeless 0  PHQ - 2 Score 0     {Show previous vital signs (optional):23777}  {Labs  Heme  Chem  Endocrine  Serology  Results Review (optional):23779}  ASSESSMENT/PLAN:   No problem-specific Assessment & Plan notes found for this encounter.    No follow-ups on file.   Zola Button, MD [place]

## 2022-03-22 ENCOUNTER — Ambulatory Visit (INDEPENDENT_AMBULATORY_CARE_PROVIDER_SITE_OTHER): Payer: Self-pay | Admitting: Family Medicine

## 2022-03-22 ENCOUNTER — Encounter: Payer: Self-pay | Admitting: Family Medicine

## 2022-03-22 VITALS — BP 133/87 | HR 73 | Ht 71.0 in | Wt 259.4 lb

## 2022-03-22 DIAGNOSIS — E538 Deficiency of other specified B group vitamins: Secondary | ICD-10-CM

## 2022-03-22 DIAGNOSIS — E782 Mixed hyperlipidemia: Secondary | ICD-10-CM

## 2022-03-22 DIAGNOSIS — Z720 Tobacco use: Secondary | ICD-10-CM

## 2022-03-22 DIAGNOSIS — Z23 Encounter for immunization: Secondary | ICD-10-CM

## 2022-03-22 DIAGNOSIS — I10 Essential (primary) hypertension: Secondary | ICD-10-CM

## 2022-03-22 DIAGNOSIS — I5022 Chronic systolic (congestive) heart failure: Secondary | ICD-10-CM

## 2022-03-22 NOTE — Assessment & Plan Note (Signed)
Chronic, well-controlled.  Check metabolic panel.

## 2022-03-22 NOTE — Assessment & Plan Note (Signed)
Has restarted smoking again 0.25 packs/day.  Encouraged cessation.

## 2022-03-22 NOTE — Assessment & Plan Note (Signed)
Euvolemic, no active symptoms.  Check labs today advised to schedule routine follow-up with his cardiologist.

## 2022-03-23 ENCOUNTER — Telehealth (HOSPITAL_COMMUNITY): Payer: Self-pay | Admitting: Family Medicine

## 2022-03-23 DIAGNOSIS — E785 Hyperlipidemia, unspecified: Secondary | ICD-10-CM

## 2022-03-23 LAB — BASIC METABOLIC PANEL
BUN/Creatinine Ratio: 13 (ref 10–24)
BUN: 15 mg/dL (ref 8–27)
CO2: 21 mmol/L (ref 20–29)
Calcium: 9.9 mg/dL (ref 8.6–10.2)
Chloride: 101 mmol/L (ref 96–106)
Creatinine, Ser: 1.19 mg/dL (ref 0.76–1.27)
Glucose: 107 mg/dL — ABNORMAL HIGH (ref 70–99)
Potassium: 4.7 mmol/L (ref 3.5–5.2)
Sodium: 141 mmol/L (ref 134–144)
eGFR: 67 mL/min/{1.73_m2} (ref >59)

## 2022-03-23 LAB — VITAMIN B12: Vitamin B-12: 433 pg/mL (ref 232–1245)

## 2022-03-23 LAB — CBC
Hematocrit: 44.6 % (ref 37.5–51.0)
Hemoglobin: 15.5 g/dL (ref 13.0–17.7)
MCH: 35.4 pg — ABNORMAL HIGH (ref 26.6–33.0)
MCHC: 34.8 g/dL (ref 31.5–35.7)
MCV: 102 fL — ABNORMAL HIGH (ref 79–97)
Platelets: 195 10*3/uL (ref 150–450)
RBC: 4.38 x10E6/uL (ref 4.14–5.80)
RDW: 12.3 % (ref 11.6–15.4)
WBC: 7.2 10*3/uL (ref 3.4–10.8)

## 2022-03-23 LAB — LIPID PANEL
Chol/HDL Ratio: 4 ratio (ref 0.0–5.0)
Cholesterol, Total: 156 mg/dL (ref 100–199)
HDL: 39 mg/dL — ABNORMAL LOW (ref >39)
LDL Chol Calc (NIH): 89 mg/dL (ref 0–99)
Triglycerides: 162 mg/dL — ABNORMAL HIGH (ref 0–149)
VLDL Cholesterol Cal: 28 mg/dL (ref 5–40)

## 2022-03-23 MED ORDER — ATORVASTATIN CALCIUM 40 MG PO TABS: 40.0000 mg | ORAL_TABLET | Freq: Every day | ORAL | 3 refills | Status: DC

## 2022-03-23 NOTE — Telephone Encounter (Signed)
Covering for Dr. Nancy Fetter. Called patient to discuss.   BMP, CBC, B12 normal. Lipids elevated, ASCVD risk calculated to be 28.8%. Currently on atorvastatin 20 mg but needs high intensity statin.   Patient amenable to increasing atorvastatin. Will send script. Recheck in about a month. Future direct LDL ordered.   Ezequiel Essex, MD   The 10-year ASCVD risk score (Arnett DK, et al., 2019) is: 28.8%   Values used to calculate the score:     Age: 67 years     Sex: Male     Is Non-Hispanic African American: Yes     Diabetic: No     Tobacco smoker: Yes     Systolic Blood Pressure: 488 mmHg     Is BP treated: Yes     HDL Cholesterol: 39 mg/dL     Total Cholesterol: 156 mg/dL

## 2022-04-03 ENCOUNTER — Other Ambulatory Visit: Payer: Self-pay

## 2022-04-04 MED ORDER — LOSARTAN POTASSIUM 100 MG PO TABS: 50.0000 mg | ORAL_TABLET | Freq: Every day | ORAL | 1 refills | Status: DC

## 2022-04-04 MED ORDER — SPIRONOLACTONE 25 MG PO TABS: 25.0000 mg | ORAL_TABLET | Freq: Every day | ORAL | 1 refills | Status: DC

## 2022-04-06 ENCOUNTER — Other Ambulatory Visit: Payer: Self-pay | Admitting: Family Medicine

## 2022-04-06 ENCOUNTER — Other Ambulatory Visit: Payer: Self-pay

## 2022-04-06 ENCOUNTER — Telehealth: Payer: Self-pay

## 2022-04-06 DIAGNOSIS — I5022 Chronic systolic (congestive) heart failure: Secondary | ICD-10-CM

## 2022-04-06 MED ORDER — LOSARTAN POTASSIUM 100 MG PO TABS: 50.0000 mg | ORAL_TABLET | Freq: Every day | ORAL | 1 refills | Status: DC

## 2022-04-06 MED ORDER — SPIRONOLACTONE 25 MG PO TABS: 25.0000 mg | ORAL_TABLET | Freq: Every day | ORAL | 1 refills | Status: DC

## 2022-04-06 MED ORDER — FUROSEMIDE 40 MG PO TABS: 40.0000 mg | ORAL_TABLET | Freq: Every day | ORAL | 1 refills | Status: DC

## 2022-04-06 MED ORDER — CARVEDILOL 25 MG PO TABS: 25.0000 mg | ORAL_TABLET | Freq: Two times a day (BID) | ORAL | 0 refills | Status: DC

## 2022-04-06 NOTE — Telephone Encounter (Signed)
Patient calls nurse line in regards to [place] refill.   Refilled was denied by covering physician for PCP.   Will forward to covering physician for clarification.

## 2022-04-27 ENCOUNTER — Other Ambulatory Visit: Payer: Self-pay | Admitting: Family Medicine

## 2022-07-04 ENCOUNTER — Other Ambulatory Visit: Payer: Self-pay | Admitting: *Deleted

## 2022-07-04 MED ORDER — CARVEDILOL 25 MG PO TABS: 25.0000 mg | ORAL_TABLET | Freq: Two times a day (BID) | ORAL | 0 refills | Status: DC

## 2022-07-25 ENCOUNTER — Other Ambulatory Visit: Payer: Self-pay | Admitting: Family Medicine

## 2022-07-31 ENCOUNTER — Other Ambulatory Visit: Payer: Self-pay | Admitting: *Deleted

## 2022-08-01 MED ORDER — LOSARTAN POTASSIUM 100 MG PO TABS: 50.0000 mg | ORAL_TABLET | Freq: Every day | ORAL | 3 refills | Status: DC

## 2022-09-08 ENCOUNTER — Ambulatory Visit (INDEPENDENT_AMBULATORY_CARE_PROVIDER_SITE_OTHER): Payer: Self-pay | Admitting: Family Medicine

## 2022-09-08 ENCOUNTER — Other Ambulatory Visit: Payer: Self-pay

## 2022-09-08 ENCOUNTER — Encounter: Payer: Self-pay | Admitting: Family Medicine

## 2022-09-08 ENCOUNTER — Ambulatory Visit (HOSPITAL_COMMUNITY)
Admission: RE | Admit: 2022-09-08 | Discharge: 2022-09-08 | Disposition: A | Discharge status: Home / Self Care | Payer: Medicare Other | Source: Ambulatory Visit | Attending: Family Medicine | Admitting: Family Medicine

## 2022-09-08 VITALS — BP 125/91 | HR 104 | Ht 71.0 in | Wt 247.0 lb

## 2022-09-08 DIAGNOSIS — Z720 Tobacco use: Secondary | ICD-10-CM

## 2022-09-08 DIAGNOSIS — Z23 Encounter for immunization: Secondary | ICD-10-CM

## 2022-09-08 DIAGNOSIS — I10 Essential (primary) hypertension: Secondary | ICD-10-CM

## 2022-09-08 DIAGNOSIS — F1010 Alcohol abuse, uncomplicated: Secondary | ICD-10-CM

## 2022-09-08 DIAGNOSIS — I5022 Chronic systolic (congestive) heart failure: Secondary | ICD-10-CM | POA: Diagnosis present

## 2022-09-08 DIAGNOSIS — M545 Low back pain, unspecified: Secondary | ICD-10-CM

## 2022-09-08 DIAGNOSIS — R0609 Other forms of dyspnea: Secondary | ICD-10-CM

## 2022-09-08 DIAGNOSIS — I4891 Unspecified atrial fibrillation: Secondary | ICD-10-CM

## 2022-09-08 DIAGNOSIS — R634 Abnormal weight loss: Secondary | ICD-10-CM | POA: Insufficient documentation

## 2022-09-08 DIAGNOSIS — G8929 Other chronic pain: Secondary | ICD-10-CM

## 2022-09-08 MED ORDER — SHINGRIX 50 MCG/0.5ML IM SUSR
0.5000 mL | INTRAMUSCULAR | 0 refills | Status: DC
Start: 2022-09-08 — End: 2022-09-08

## 2022-09-08 MED ORDER — TETANUS-DIPHTH-ACELL PERTUSSIS 5-2.5-18.5 LF-MCG/0.5 IM SUSY
0.5000 mL | PREFILLED_SYRINGE | Freq: Once | INTRAMUSCULAR | 0 refills | Status: DC
Start: 2022-09-08 — End: 2022-09-08

## 2022-09-08 MED ORDER — APIXABAN 5 MG PO TABS: 5.0000 mg | ORAL_TABLET | Freq: Two times a day (BID) | ORAL | 3 refills | Status: DC

## 2022-09-08 NOTE — Assessment & Plan Note (Signed)
Unclear if due to cardiac etiology (though if progressive HF would expect weight gain--but possibly had gut edema and early satiety from this CBC, CMP, TSH today HIV ordered  Consider abdominal imaging vs. GI referral if early satiety persists

## 2022-09-08 NOTE — Assessment & Plan Note (Signed)
Had previously quit Now smoking 1 PPD Given dyspnea, CXR today Discussed cessation briefly Will discuss low dose CT at follow up

## 2022-09-08 NOTE — Assessment & Plan Note (Signed)
No p waves before QRS Does have frequent PAC on prior EKG Discussed options--this has been ongoing for 1 month. He has mild to moderate symptoms. Given lateral changes on EKG discussed possibility of ischemic cause--discussed ED evaluation Strongly prefers outpatient evaluation Engaged in shared decision making Reviewed ED precautions Echo scheduled for Monday as well as follow up at [facility] Will schedule with Cardiology--new referral placed   Plan - Start Eliquis 5 mg BID  - May be able to stop ASA in future - Discussed bleeding risk, CHADS2VASC is 74 (age, HTN, HF) - Already rate controlled  - Echo, TSH, CBC, BNP, BMP today - Follow up on Monday - Medication to his pharmacy---will message Durward Mallard to help with cost

## 2022-09-08 NOTE — Progress Notes (Signed)
SUBJECTIVE:   CHIEF COMPLAINT: dyspnea, back pain, scrotal swelling HPI:   Shawn Perkins is a 67 y.o.  with history notable for heart failure with previously reduced (now improved) EF (as low as 20%, last echo in 2020 showed EF 55%) , HTN, and tobacco use  presenting for dyspnea, back pain, and scrotal swelling.  Dyspnea Patient reports 1 month of dyspnea on exertion and PND. He reports dyspnea only when walking up stairs. Has PND at night for last month. Has noticed weight loss but some scrotal swelling last weekend that resolved. Taking Lasix as prescribed. Denies CP, palpitations, syncope, stroke symptoms. Denies history of atrial fibrillation.  Denies any heart racing when he walks.   Scrotal Swelling  Patient reports over weekend had bilateral scrotal edema. No pain or skin changes. Would prefer to have exam with male provider. Denies dysuria or other changes. This has now resolved. Taking 40 mg Lasix daily. Denies significant leg pain. Has had weight loss.    Back Pain Has had years of left sided back pain. Reports chronic back pain at end of day in low back. Worse with flexion and extension. Improves with Tylenol and rest. No radiating pain, bowel or bladder symptoms, no saddle anesthesia. 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    OBJECTIVE:   BP (!) 125/91   Pulse (!) 104   Ht 5\' 11"  (1.803 m)   Wt 247 lb (112 kg)   SpO2 98%   BMI 34.45 kg/m   Today's weight:  Last Weight  Most recent update: 09/08/2022  8:32 AM    Weight  112 kg (247 lb)            Review of prior weights: [employer]   09/08/22 0832  Weight: 247 lb (112 kg)    Well appearing, poor dentition No JVP Lungs  clear Irregular rate and rhythm, systolic murmur at LLSB Abdomen + diastasis, no ascites LE with 1+ edema    EKG by my read Atrial fibrillation (irregularly irregular) with PVC  Lateral T wave and inferior changes similar to prior  Artifact present There are no p waves before each QRS complex  Uploaded in media tab  ASSESSMENT/PLAN:   Unintentional weight loss Unclear if due to cardiac etiology (though if progressive HF would expect weight gain--but possibly had gut edema and early satiety from this CBC, CMP, TSH today HIV ordered  Consider abdominal imaging vs. GI referral if early satiety persists   Atrial fibrillation (HCC) No p waves before QRS Does have frequent PAC on prior EKG Discussed options--this has been ongoing for 1 month. He has mild to moderate symptoms. Given lateral changes on EKG discussed possibility of ischemic cause--discussed ED evaluation Strongly prefers outpatient evaluation Engaged in shared decision making Reviewed ED precautions Echo scheduled for Monday as well as follow up at [facility] Will schedule with Cardiology--new referral placed   Plan - Start Eliquis 5 mg BID  - May be able to stop ASA in future - Discussed bleeding risk, CHADS2VASC is 17 (age, HTN, HF) - Already rate controlled  - Echo, TSH, CBC, BNP, BMP today - Follow up on Monday -  Medication to his pharmacy---will message Durward Mallard to help with cost   Tobacco abuse Had previously quit Now smoking 1 PPD Given dyspnea, CXR today Discussed cessation briefly Will discuss low dose CT at follow up   ETOH abuse Discussed cessation with HF and atrial fibrillation   Chronic left-sided low back pain without sciatica Chronic, ongoing and limiting function No sciatic pain Given duration, will obtain advanced imaging Tylenol for pain, discussed cannot use NSAIDs (he already avoids)   Scrotal swelling Scheduled with Dr. Royal Piedra Consider testicular ultrasound pending findings     Terisa Starr, MD  Family Medicine Teaching Service  [facility]

## 2022-09-08 NOTE — Addendum Note (Signed)
Addended byManson Passey, Emerie Vanderkolk on: 09/08/2022 04:03 PM   Modules accepted: Orders

## 2022-09-08 NOTE — Assessment & Plan Note (Signed)
Discussed cessation with HF and atrial fibrillation

## 2022-09-08 NOTE — Assessment & Plan Note (Signed)
Chronic, ongoing and limiting function No sciatic pain Given duration, will obtain advanced imaging Tylenol for pain, discussed cannot use NSAIDs (he already avoids)

## 2022-09-08 NOTE — Patient Instructions (Signed)
It was wonderful to see you today.  Please bring ALL of your medications with you to every visit.   Today we talked about:   - Getting blood work  - Checking your heart function   - You will be called about an MRI  - Your heart EKG shows you have a condition called atrial fibrillation (a fib)  This is a heart electricity issue  This puts you at risk for stroke  To reduce this risk, we thin your blood  We started a medication call Eliquis 5 mg daily  I gave you a sample--the dose is 5 mg daily   I also sent a prescription to your pharmacy  Take 1 pill (5 mg) twice a day   You will be called about a Cardiology visit  You will continue your aspirin for now--we may discontinue your aspirin in the future      I will call you with labs  Please continue your same medications for now   Follow up on Monday   Thank you for choosing [facility].   Please call [phone] with any questions about today's appointment.  Please be sure to schedule follow up at the front  desk before you leave today.   Terisa Starr, MD  Family Medicine

## 2022-09-09 LAB — HEPATIC FUNCTION PANEL
ALT: 17 IU/L (ref 0–44)
AST: 25 IU/L (ref 0–40)
Bilirubin, Direct: 0.52 mg/dL — ABNORMAL HIGH (ref 0.00–0.40)

## 2022-09-09 LAB — TSH RFX ON ABNORMAL TO FREE T4: TSH: 2.11 u[IU]/mL (ref 0.450–4.500)

## 2022-09-09 LAB — BASIC METABOLIC PANEL
BUN/Creatinine Ratio: 9 — ABNORMAL LOW (ref 10–24)
BUN: 9 mg/dL (ref 8–27)
CO2: 27 mmol/L (ref 20–29)
Creatinine, Ser: 1 mg/dL (ref 0.76–1.27)
Glucose: 96 mg/dL (ref 70–99)
Potassium: 3.6 mmol/L (ref 3.5–5.2)
Sodium: 143 mmol/L (ref 134–144)

## 2022-09-09 LAB — CBC
MCH: 32.6 pg (ref 26.6–33.0)
RBC: 4.45 x10E6/uL (ref 4.14–5.80)
RDW: 13.4 % (ref 11.6–15.4)
WBC: 5.1 10*3/uL (ref 3.4–10.8)

## 2022-09-09 LAB — BRAIN NATRIURETIC PEPTIDE

## 2022-09-10 LAB — BASIC METABOLIC PANEL
Calcium: 9.8 mg/dL (ref 8.6–10.2)
Chloride: 99 mmol/L (ref 96–106)
eGFR: 82 mL/min/{1.73_m2} (ref >59)

## 2022-09-10 LAB — HEPATIC FUNCTION PANEL
Albumin: 4.2 g/dL (ref 3.9–4.9)
Alkaline Phosphatase: 110 IU/L (ref 44–121)
Bilirubin Total: 2.5 mg/dL — ABNORMAL HIGH (ref 0.0–1.2)
Total Protein: 6.9 g/dL (ref 6.0–8.5)

## 2022-09-10 LAB — CBC
Hematocrit: 43 % (ref 37.5–51.0)
Hemoglobin: 14.5 g/dL (ref 13.0–17.7)
MCHC: 33.7 g/dL (ref 31.5–35.7)
MCV: 97 fL (ref 79–97)
Platelets: 166 10*3/uL (ref 150–450)

## 2022-09-11 ENCOUNTER — Ambulatory Visit: Payer: Self-pay

## 2022-09-11 ENCOUNTER — Telehealth: Payer: Self-pay | Admitting: Family Medicine

## 2022-09-11 ENCOUNTER — Ambulatory Visit (HOSPITAL_COMMUNITY): Payer: Medicare Other | Attending: Family Medicine

## 2022-09-11 DIAGNOSIS — R0609 Other forms of dyspnea: Secondary | ICD-10-CM | POA: Diagnosis not present

## 2022-09-11 LAB — ECHOCARDIOGRAM COMPLETE
Area-P 1/2: 3.5 cm2
Est EF: 45
MV M vel: 4.58 m/s
MV Peak grad: 83.9 mmHg
P 1/2 time: 379 msec
Radius: 0.8 cm
S' Lateral: 4.5 cm

## 2022-09-11 MED ORDER — POTASSIUM CHLORIDE CRYS ER 20 MEQ PO TBCR: EXTENDED_RELEASE_TABLET | ORAL | 0 refills | Status: DC

## 2022-09-11 NOTE — Telephone Encounter (Signed)
Called patient to discuss labs. BNP elevated. T bili elevated.  He feels better. No chest pain or issues breathing Plan - Increase lasix from 40 mg daily to BID for next TWO days - Take KCL tablets daily while on increased dose - follow up Wednesday for repeat CMP, check hepatitis panel as well - Patient was referred to Cardiology, please ensure scheduled at follow up

## 2022-09-11 NOTE — Progress Notes (Deleted)
  SUBJECTIVE:   CHIEF COMPLAINT / HPI:   Presents today for follow-up regarding new A-fib on 09/08/2022.  Further had new scrotal swelling at that visit as well.    PERTINENT  PMH / PSH: ***  Past Medical History:  Diagnosis Date   CHF (congestive heart failure) (HCC)    Diverticulosis    Heart murmur    History of cocaine abuse (HCC)    use to smoke crack cocaine, last use was more than 6 years ago   Hypertension    Rheumatic fever    as a child   Systolic heart failure    a. 11/09/10 echo EF 35%, b. 11/11/10 cath norm cors c. ECHO (11/2012) EF 35-50%   Umbilical hernia, incarcerated 11/2012   11/2012 exploratory lap with small bwl resection    Patient Care Team: Westley Chandler, MD as PCP - General (Family Medicine) OBJECTIVE:  There were no vitals taken for this visit. Physical Exam   ASSESSMENT/PLAN:  There are no diagnoses linked to this encounter. No follow-ups on file. Shelby Mattocks, DO 09/11/2022, 1:21 PM PGY-***, [facility] {    This will disappear when note is signed, click to select method of visit    :1}

## 2022-09-12 ENCOUNTER — Telehealth: Payer: Self-pay | Admitting: Family Medicine

## 2022-09-12 ENCOUNTER — Other Ambulatory Visit (HOSPITAL_COMMUNITY): Payer: Self-pay

## 2022-09-12 NOTE — Telephone Encounter (Signed)
Called patient with echocardiogram results. Shows now global hypokinesis and EF 45%. He reports he feels well. Does not desire to go to ED for evaluation, strongly prefers to wait for outpatient Cardiology visit. Discussed ED precautions.  He plans to attend visit tomorrow AM to check in.   Terisa Starr, MD  Family Medicine Teaching Service

## 2022-09-13 ENCOUNTER — Ambulatory Visit (INDEPENDENT_AMBULATORY_CARE_PROVIDER_SITE_OTHER): Payer: Medicare Other | Admitting: Student

## 2022-09-13 VITALS — BP 112/72 | HR 94 | Ht 71.0 in | Wt 242.2 lb

## 2022-09-13 DIAGNOSIS — R17 Unspecified jaundice: Secondary | ICD-10-CM | POA: Diagnosis present

## 2022-09-13 DIAGNOSIS — I5022 Chronic systolic (congestive) heart failure: Secondary | ICD-10-CM

## 2022-09-13 NOTE — Patient Instructions (Signed)
It was great to see you! Thank you for allowing me to participate in your care!  I recommend that you always bring your medications to each appointment as this makes it easy to ensure we are on the correct medications and helps [place] not miss when refills are needed.  Our plans for today:  - Continue Lasix, twice a day, until Friday. Starting Saturday, take it just once a day  Checking your electrolyte levels today  Follow up on Friday - Start taking the KCL pills - Checking your liver for hepatitis (liver viral infection) One of your liver markers was elevated, we want to be sure you are not having a viral infection of the liver.  We are checking some labs today, I will call you if they are abnormal will send you a MyChart message or a letter if they are normal.  If you do not hear about your labs in the next 2 weeks please let us know.  Take care and seek immediate care sooner if you develop any concerns.   Dr. Bess Kinds, MD [facility]

## 2022-09-13 NOTE — Progress Notes (Signed)
SUBJECTIVE:   CHIEF COMPLAINT / HPI:   A. Fib/CHF f/u -Seen 7/22 for A. Fib, started on Tx, also noted to have elevated BNP > lasix BID x 48 hrs. Bili also noted to be elevated.   Today:  Notes that he's taking lasix twice a day, and is taking another water pill twice a day. Note's he's peeing more than normal. Has not been taking the KCL as he has not picked it up yet. Note's his breathing is getting better ~ 80%. Having to catch his breath when going up stairs.   Is taking his eliquis  Elevated Bilirubin Patient noted to have elevated bili of 2.5 on last round of labs. Patient denies any nausea/vomiting/diarrhea/fever, reports normal state of health.   Marland Kitchen   PERTINENT  PMH / PSH: CHF, Afib    Patient Care Team: Westley Chandler, MD as PCP - General (Family Medicine) OBJECTIVE:  BP 112/72   Pulse 94   Ht 5\' 11"  (1.803 m)   Wt 242 lb 3.2 oz (109.9 kg)   SpO2 97%   BMI 33.78 kg/m  Physical Exam Constitutional:      General: He is not in acute distress.    Appearance: Normal appearance. He is not ill-appearing.  Cardiovascular:     Rate and Rhythm: Normal rate.     Pulses: Normal pulses.     Heart sounds: Normal heart sounds. No murmur heard.    No friction rub. No gallop.  Pulmonary:     Effort: Pulmonary effort is normal. No respiratory distress.     Breath sounds: Normal breath sounds. No stridor. No wheezing, rhonchi or rales.  Abdominal:     General: There is no distension.     Palpations: Abdomen is soft. There is no mass.     Tenderness: There is no abdominal tenderness.  Skin:    Capillary Refill: Capillary refill takes less than 2 seconds.  Neurological:     Mental Status: He is alert.  Psychiatric:        Mood and Affect: Mood normal.        Behavior: Behavior normal.      ASSESSMENT/PLAN:  Elevated bilirubin Assessment & Plan: Patient noted to have elevated bilirubin on last set of labs.  Reports no GI symptoms, will check hepatitis panel. -  Hepatitis panel - CMP  Orders: -     Comprehensive metabolic panel -     HCV Ab w Reflex to Quant PCR  Chronic systolic heart failure (HCC) Assessment & Plan: Patient comes in for follow-up of his heart failure.  Patient notes that he has been taking Lasix twice a day with increased urine output.  He reports he has not been taking his KCl as he has not picked it up yet.  Discussed with him the importance of checking his potassium as this medication could make his low.  Patient notes he would not be able to pick up potassium until Friday.  Patient still notes shortness of breath with exertion, but denies any chest pain.  Patient physical exam benign, no signs of edema, or volume overload.  Given patient's respiratory status will recommend continue diuresis, and short follow-up. - Continue Lasix twice daily - Follow-up Friday morning - BMP  Orders: -     Basic metabolic panel  Other orders -     Interpretation: -     EKG 12-Lead   No follow-ups on file. Bess Kinds, MD 09/15/2022, 11:17 PM PGY-3, [facility]

## 2022-09-14 ENCOUNTER — Ambulatory Visit (HOSPITAL_COMMUNITY)
Admission: RE | Admit: 2022-09-14 | Discharge: 2022-09-14 | Disposition: A | Discharge status: Home / Self Care | Payer: Medicare Other | Source: Ambulatory Visit | Attending: Family Medicine | Admitting: Family Medicine

## 2022-09-14 LAB — COMPREHENSIVE METABOLIC PANEL
ALT: 17 IU/L (ref 0–44)
Albumin: 4.1 g/dL (ref 3.9–4.9)
Alkaline Phosphatase: 106 IU/L (ref 44–121)
BUN/Creatinine Ratio: 10 (ref 10–24)
BUN: 12 mg/dL (ref 8–27)
Bilirubin Total: 2.5 mg/dL — ABNORMAL HIGH (ref 0.0–1.2)
CO2: 24 mmol/L (ref 20–29)
Calcium: 9.3 mg/dL (ref 8.6–10.2)
Chloride: 100 mmol/L (ref 96–106)
Creatinine, Ser: 1.15 mg/dL (ref 0.76–1.27)
Globulin, Total: 3 g/dL (ref 1.5–4.5)
Sodium: 142 mmol/L (ref 134–144)
Total Protein: 7.1 g/dL (ref 6.0–8.5)
eGFR: 70 mL/min/{1.73_m2} (ref >59)

## 2022-09-14 LAB — HCV INTERPRETATION

## 2022-09-14 LAB — HCV AB W REFLEX TO QUANT PCR: HCV Ab: NONREACTIVE

## 2022-09-15 ENCOUNTER — Ambulatory Visit (INDEPENDENT_AMBULATORY_CARE_PROVIDER_SITE_OTHER): Payer: Medicare Other | Admitting: Family Medicine

## 2022-09-15 ENCOUNTER — Other Ambulatory Visit: Payer: Self-pay

## 2022-09-15 ENCOUNTER — Ambulatory Visit (HOSPITAL_COMMUNITY): Payer: Medicare Other

## 2022-09-15 VITALS — BP 103/82 | HR 107 | Ht 71.0 in | Wt 240.2 lb

## 2022-09-15 DIAGNOSIS — I5022 Chronic systolic (congestive) heart failure: Secondary | ICD-10-CM | POA: Diagnosis not present

## 2022-09-15 DIAGNOSIS — I4891 Unspecified atrial fibrillation: Secondary | ICD-10-CM | POA: Diagnosis not present

## 2022-09-15 DIAGNOSIS — R17 Unspecified jaundice: Secondary | ICD-10-CM | POA: Insufficient documentation

## 2022-09-15 NOTE — Assessment & Plan Note (Signed)
Patient comes in for follow-up of his heart failure.  Patient notes that he has been taking Lasix twice a day with increased urine output.  He reports he has not been taking his KCl as he has not picked it up yet.  Discussed with him the importance of checking his potassium as this medication could make his low.  Patient notes he would not be able to pick up potassium until Friday.  Patient still notes shortness of breath with exertion, but denies any chest pain.  Patient physical exam benign, no signs of edema, or volume overload.  Given patient's respiratory status will recommend continue diuresis, and short follow-up. - Continue Lasix twice daily - Follow-up Friday morning - BMP

## 2022-09-15 NOTE — Assessment & Plan Note (Addendum)
Down 2 lbs since last visit 2 days ago on Lasix BID, breathing also improving. Appears close to euvolemic. Plan to resume once daily Lasix tomorrow. Of note, pt was mildly hypokalemic at prior visit with BID dosing and has yet to start Kcl supplement, planning to start tomorrow. Will repeat CMP today to f/u K and mild transaminitis at prior visit.  - Continue Lasix 40mg  daily - Advised to check weight daily. Take 2 tablets of lasix if noticing 5lb wt gain or worsening SOB and to schedule f/u - Cont Kcl daily - f/u BMP - f/u in 1 week - Discussed ED precuaitons

## 2022-09-15 NOTE — Assessment & Plan Note (Signed)
Rate at goal (HR 102 today). Has not start eliquis yet, but plans to pick up meds tomorrow. Not yet heard from Cardiology, will f/u with clinic staff to make sure this happens.

## 2022-09-15 NOTE — Patient Instructions (Addendum)
Good to see you today - Thank you for coming in  Things we discussed today:  1) Your body had too much fluid building up, which was causing you to have trouble breathing. - Continue to take Lasix ONCE a day - Go home and weigh yourself on your scale and write that number down. Weigh yourself everyday to see if your weight is going back up, this would be a sign that your fluid is building back up again. - Expect a phone call in the next few days to schedule an appointment to see your Cardiologist. - If your weight goes 5 pounds above your baseline OR your breathing is getting worse again, take 2 pills a day and schedule an appointment to see [place] as soon as possible.  2) Be sure to pick up your medications from the pharmacy as soon as possible. - You will start taking Eliquis twice a day. This is to help prevent blood clots. - Continue to take Lasix once a day - Start taking Kcl (aka potassium) once a day  Please go call 911 and go to the Emergency Department if: - You start having chest pain - You are having worsening shortness of breath - You faint or feel close to fainting  Come back to see [place] in 1 week.

## 2022-09-15 NOTE — Assessment & Plan Note (Addendum)
Patient noted to have elevated bilirubin on last set of labs.  Reports no GI symptoms, will check hepatitis panel. - Hepatitis panel - CMP

## 2022-09-15 NOTE — Progress Notes (Signed)
SUBJECTIVE:   CHIEF COMPLAINT / HPI:   Shawn Perkins is a 67yo M w/ hx of HFrEF, new afib, HTN, alcohol use that p/f afib and HFrEF f/u.  - Recently had worsening EF on echo, afib, and SOB 2/2 fluid overload. Was started on Lasix BID and improving. - Still taking Lasix BID, last dose tonight; then will continue once daily dosing tomorrow - Works as apartment maintenance. He has to cluimb a lot of stairs for work. Usually has to take a break after 1 flight of stairs to catch breath, but now able to climb it without getting as out of breath - Denies muscle cramps, chest pain, leg swelling - Has been unable to get to pharmacy so hasn't picked up Kcl or eliquis yet, plans to go tomorrow - Weight is down 2lbs from prior visit 2 days ago. - Reports no alcohol since last Friday, denies tremors or hx of withdrawal - Also cutting down 3-4 cigs a day   OBJECTIVE:   BP 103/82   Pulse (!) 107   Ht 5\' 11"  (1.803 m)   Wt 240 lb 3.2 oz (109 kg)   SpO2 99%   BMI 33.50 kg/m   General: Alert, pleasant man. NAD. HEENT: NCAT. MMM. CV: irregular rhythm, regular rate Resp: CTAB, no wheezing or crackles. Normal WOB on RA.  Abm: Soft, nontender, nondistended. BS present. Ext: No pitting edema BL Skin: Warm, well perfused   ASSESSMENT/PLAN:   Atrial fibrillation (HCC) Rate at goal (HR 102 today). Has not start eliquis yet, but plans to pick up meds tomorrow. Not yet heard from Cardiology, will f/u with clinic staff to make sure this happens.  Chronic systolic heart failure Down 2 lbs since last visit 2 days ago on Lasix BID, breathing also improving. Appears close to euvolemic. Plan to resume once daily Lasix tomorrow. Of note, pt was mildly hypokalemic at prior visit with BID dosing and has yet to start Kcl supplement, planning to start tomorrow. Will repeat CMP today to f/u K and mild transaminitis at prior visit.  - Continue Lasix 40mg  daily - Advised to check weight daily. Take 2 tablets of lasix if  noticing 5lb wt gain or worsening SOB and to schedule f/u - Cont Kcl daily - f/u BMP - f/u in 1 week - Discussed ED precuaitons  F/u Recs: - Confirm if patient is taking eliquis, Kcl, and lasix - confirm if pt was able to schedule w/ Cardiology - Consider repeat BMP/CMP     Patient Instructions  Good to see you today - Thank you for coming in  Things we discussed today:  1) Your body had too much fluid building up, which was causing you to have trouble breathing. - Continue to take Lasix ONCE a day - Go home and weigh yourself on your scale and write that number down. Weigh yourself everyday to see if your weight is going back up, this would be a sign that your fluid is building back up again. - Expect a phone call in the next few days to schedule an appointment to see your Cardiologist. - If your weight goes 5 pounds above your baseline OR your breathing is getting worse again, take 2 pills a day and schedule an appointment to see [place] as soon as possible.  2) Be sure to pick up your medications from the pharmacy as soon as possible. - You will start taking Eliquis twice a day. This is to help prevent blood clots. - Continue  to take Lasix once a day - Start taking Kcl (aka potassium) once a day  Please go call 911 and go to the Emergency Department if: - You start having chest pain - You are having worsening shortness of breath - You faint or feel close to fainting  Come back to see [place] in 1 week.   Lincoln Brigham, MD [facility]

## 2022-09-21 ENCOUNTER — Encounter (HOSPITAL_COMMUNITY): Payer: Self-pay | Admitting: Family Medicine

## 2022-09-26 NOTE — Progress Notes (Addendum)
Advanced [facility] Failure Clinic   Patient ID: Shawn Perkins, male   DOB: [birth year], 67 y.o.   MRN: [id number]  PCP: Shawn Chandler, MD HF Cardiology: Dr. Shirlee Perkins  HPI: Shawn Perkins is a 67 y.o. African-American male with a h/o severe HTN, tobacco and ETOH use, and chronic systolic HF/NICM.   Cath (11/11/10) which showed normal cors. EF 20%, RA 7, RV 51/4, PA 61/30, with a mean of 47, PCWP 30, CO 4.0. He was diuresed aggressively and started on GDMT. He was discharged home, weight was 219 lbs.  Admitted 10/14 with a/c systolic heart failure in setting of medication noncompliance x several months, and severe HTN. HF medications restarted. Echo showed EF back down to 35-40% from 55%.  He was discharged home, weight 214 lbs.  Last seen 04/2020. Stable NYHA II and volume stable. Plan to repeat echo in 1 year.  Saw PCP 09/08/22 with SOB x 1 month. Found to be in new AF, rate controlled, and volume overloaded. Advised evaluation in ED, but patient declined. Started on Eliquis. BNP 600, and Lasix increased to bid. Echo 7/24 showed EF 45%, RV normal, moderate to severe MR and TR  Today he returns for HF follow up. We have not seen him since 2022. Overall feeling ok. He is not SOB walking on flat ground, has SOB walking up steps. Legs are tired, has occasional positional dizziness. Denies palpitations, CP, abnormal bleeding, edema, or PND/Orthopnea. Appetite ok. No fever or chills. Weight at home 239 pounds. Taking all medications. Smokes 2 cigs/day, stopped drinking ETOH for past 3-4 weeks. Works in Location manager. He is unsure if he snores.  ECG (personally reviewed): atrial fibrillation 96 bpm  ReDs: 34%  Labs (5/20): K 4.1, creatinine 1.11, LDL 93  Labs (7/24): K 3.6, creatinine 1.14, TSH normal  Cardiac Studies - R/LHC (9/12): normal cors, RA 7, RV 51/4, PA 61/30, with a mean of 47, PCWP 30, CO 4.0. - Echo (12/12): EF 55-60% - Echo (10/14): EF 35-40%  - Echo (4/15): EF 50-55%  - Echo  (5/18): EF 55-60%, moderate LVH, normal RV size and systolic function. - Echo (2020): EF 55-60%  - Echo (7/24, in Afib): EF 45%, normal RV, moderate to severe MR, moderate to severe TR  ABIs (3/19): Normal.   ROS: All systems negative except as listed in HPI, PMH and Problem List.  Past Medical History:  Diagnosis Date   CHF (congestive heart failure) (HCC)    Diverticulosis    Heart murmur    History of cocaine abuse (HCC)    use to smoke crack cocaine, last use was more than 6 years ago   Hypertension    Rheumatic fever    as a child   Systolic heart failure    a. 11/09/10 echo EF 35%, b. 11/11/10 cath norm cors c. ECHO (11/2012) EF 35-50%   Umbilical hernia, incarcerated 11/2012   11/2012 exploratory lap with small bwl resection   Current Outpatient Medications  Medication Sig Dispense Refill   apixaban (ELIQUIS) 5 MG TABS tablet Take 1 tablet (5 mg total) by mouth 2 (two) times daily. START AFTER YOU FINISH THE SAMPLE PACK 60 tablet 3   aspirin EC 81 MG tablet Take 1 tablet (81 mg total) by mouth daily. 90 tablet 3   atorvastatin (LIPITOR) 40 MG tablet Take 1 tablet (40 mg total) by mouth daily. 90 tablet 3   carvedilol (COREG) 25 MG tablet Take 1 tablet (25 mg total) by mouth  2 (two) times daily. Must have visit before next fill 180 tablet 0   furosemide (LASIX) 40 MG tablet Take 1 tablet (40 mg total) by mouth daily. PLEASE CALL THE OFFICE TO ARRANGE YOUR FOLLOW UP APPOINTMENT 90 tablet 1   losartan (COZAAR) 100 MG tablet Take 0.5 tablets (50 mg total) by mouth daily. 90 tablet 3   spironolactone (ALDACTONE) 25 MG tablet TAKE 1 TABLET (25 MG TOTAL) BY MOUTH DAILY. 90 tablet 3   vitamin B-12 (CYANOCOBALAMIN) 1000 MCG tablet Take 1 tablet (1,000 mcg total) by mouth daily. 90 tablet 3   Current Facility-Administered Medications  Medication Dose Route Frequency Provider Last Rate Last Admin   0.9 %  sodium chloride infusion  500 mL Intravenous Continuous [name], [name], MD        BP 104/72   Pulse 93   Wt 108.5 kg (239 lb 3.2 oz)   SpO2 98%   BMI 33.36 kg/m   Wt Readings from Last 3 Encounters:  09/27/22 108.5 kg (239 lb 3.2 oz)  09/15/22 109 kg (240 lb 3.2 oz)  09/13/22 109.9 kg (242 lb 3.2 oz)   PHYSICAL EXAM: General:  NAD. No resp difficulty, walked into clinic HEENT: Normal Neck: Supple. No JVD. Carotids 2+ bilat; no bruits. No lymphadenopathy or thryomegaly appreciated. Cor: PMI nondisplaced. Regular rate & rhythm. No rubs, gallops or murmurs. Lungs: Clear, diminished in bases Abdomen: Soft, obese, nontender, nondistended. No hepatosplenomegaly. No bruits or masses. Good bowel sounds. + umbilical hernia Extremities: No cyanosis, clubbing, rash, edema; + BLE varicosities  Neuro: Alert & oriented x 3, cranial nerves grossly intact. Moves all 4 extremities w/o difficulty. Affect pleasant.  ASSESSMENT & PLAN: 1. Chronic Diastolic Heart Failure: Nonischemic cardiomyopathy, possibly related to HTN. Echo (2022) EF 55-60%. New AFib 7/24, echo (7/24) showed EF 45%, normal RV, moderate to severe TR/MR. Suspect drop in EF tachy-mediated. No chest pain or obvious ACS, defer LHC for now. NYHA II-IIb, he is not volume overloaded today, ReDs 34% - Stop losartan. - Start Entresto 24/26 mg bid. BMET/BNP today. - Continue Lasix 40 mg daily. - Continue spiro 25 mg daily.  - Continue Coreg 25 mg bid.  - Plan to add SGLT2i next visit and decrease Lasix to PRN. Check A1C today. 2. Atrial fibrillation: Newly diagnosed 7/24. Rate controlled. TSH normal. Remains in AFib on ECG today, rate controlled. - Start amiodarone 200 mg bid x 2 weeks, then 200 mg daily. Plan for DCCV, if does not convert chemically. - Will obtain baseline PFTs w/ DLCO, and CXR with initiation of amiodarone therapy. - Continue Eliquis 5 mg bid. He has not missed any doses. Rx >$550, engage pharmacy for patient assistance/samples - Arrange sleep study. 3. HTN: Stable.  - GDMT as above. 4. Tobacco  use: Discussed cessation. He is cutting down  5. ETOH: Quit x 3-4 weeks. - Congratulated.  Follow up in 3 weeks with APP. If remains in AF, will arrange DCCV (he will need ECG and BMET at this visit).  Shawn Perkins [facility] FNP-BC  09/27/2022

## 2022-09-27 ENCOUNTER — Other Ambulatory Visit (HOSPITAL_COMMUNITY): Payer: Self-pay

## 2022-09-27 ENCOUNTER — Encounter (HOSPITAL_COMMUNITY): Payer: Self-pay

## 2022-09-27 ENCOUNTER — Telehealth (HOSPITAL_COMMUNITY): Payer: Self-pay | Admitting: Cardiology

## 2022-09-27 ENCOUNTER — Ambulatory Visit (HOSPITAL_COMMUNITY)
Admission: RE | Admit: 2022-09-27 | Discharge: 2022-09-27 | Disposition: A | Discharge status: Home / Self Care | Payer: Medicare Other | Source: Ambulatory Visit | Attending: Family Medicine | Admitting: Family Medicine

## 2022-09-27 ENCOUNTER — Telehealth (HOSPITAL_COMMUNITY): Payer: Self-pay

## 2022-09-27 VITALS — BP 104/72 | HR 93 | Wt 239.2 lb

## 2022-09-27 DIAGNOSIS — I5042 Chronic combined systolic (congestive) and diastolic (congestive) heart failure: Secondary | ICD-10-CM | POA: Insufficient documentation

## 2022-09-27 DIAGNOSIS — R7303 Prediabetes: Secondary | ICD-10-CM

## 2022-09-27 DIAGNOSIS — I428 Other cardiomyopathies: Secondary | ICD-10-CM | POA: Diagnosis not present

## 2022-09-27 DIAGNOSIS — I110 Hypertensive heart disease with heart failure: Secondary | ICD-10-CM | POA: Diagnosis not present

## 2022-09-27 DIAGNOSIS — Z720 Tobacco use: Secondary | ICD-10-CM

## 2022-09-27 DIAGNOSIS — I5022 Chronic systolic (congestive) heart failure: Secondary | ICD-10-CM

## 2022-09-27 DIAGNOSIS — Z7901 Long term (current) use of anticoagulants: Secondary | ICD-10-CM | POA: Insufficient documentation

## 2022-09-27 DIAGNOSIS — I4891 Unspecified atrial fibrillation: Secondary | ICD-10-CM | POA: Diagnosis not present

## 2022-09-27 DIAGNOSIS — Z91148 Patient's other noncompliance with medication regimen for other reason: Secondary | ICD-10-CM | POA: Diagnosis present

## 2022-09-27 DIAGNOSIS — E6609 Other obesity due to excess calories: Secondary | ICD-10-CM | POA: Diagnosis not present

## 2022-09-27 DIAGNOSIS — I10 Essential (primary) hypertension: Secondary | ICD-10-CM

## 2022-09-27 DIAGNOSIS — R0683 Snoring: Secondary | ICD-10-CM

## 2022-09-27 DIAGNOSIS — F17210 Nicotine dependence, cigarettes, uncomplicated: Secondary | ICD-10-CM | POA: Insufficient documentation

## 2022-09-27 DIAGNOSIS — Z6835 Body mass index (BMI) 35.0-35.9, adult: Secondary | ICD-10-CM

## 2022-09-27 DIAGNOSIS — Z79899 Other long term (current) drug therapy: Secondary | ICD-10-CM | POA: Insufficient documentation

## 2022-09-27 LAB — CBC
HCT: 41.8 % (ref 39.0–52.0)
Hemoglobin: 14.3 g/dL (ref 13.0–17.0)
MCH: 32.9 pg (ref 26.0–34.0)
MCHC: 34.2 g/dL (ref 30.0–36.0)
MCV: 96.1 fL (ref 80.0–100.0)
Platelets: 150 10*3/uL (ref 150–400)
RBC: 4.35 MIL/uL (ref 4.22–5.81)
RDW: 14.8 % (ref 11.5–15.5)
WBC: 5.1 10*3/uL (ref 4.0–10.5)
nRBC: 0 % (ref 0.0–0.2)

## 2022-09-27 LAB — BASIC METABOLIC PANEL
Anion gap: 15 (ref 5–15)
BUN: 8 mg/dL (ref 8–23)
CO2: 25 mmol/L (ref 22–32)
Calcium: 9.4 mg/dL (ref 8.9–10.3)
Chloride: 99 mmol/L (ref 98–111)
Creatinine, Ser: 1.09 mg/dL (ref 0.61–1.24)
GFR, Estimated: >60 mL/min (ref >60)
Glucose, Bld: 92 mg/dL (ref 70–99)
Potassium: 3 mmol/L — ABNORMAL LOW (ref 3.5–5.1)
Sodium: 139 mmol/L (ref 135–145)

## 2022-09-27 LAB — HEMOGLOBIN A1C
Hgb A1c MFr Bld: 6.2 % — ABNORMAL HIGH (ref 4.8–5.6)
Mean Plasma Glucose: 131.24 mg/dL

## 2022-09-27 LAB — BRAIN NATRIURETIC PEPTIDE: B Natriuretic Peptide: 537.2 pg/mL — ABNORMAL HIGH (ref 0.0–100.0)

## 2022-09-27 MED ORDER — POTASSIUM CHLORIDE CRYS ER 20 MEQ PO TBCR: EXTENDED_RELEASE_TABLET | ORAL | 3 refills | Status: DC

## 2022-09-27 MED ORDER — ENTRESTO 24-26 MG PO TABS: 1.0000 | ORAL_TABLET | Freq: Two times a day (BID) | ORAL | 6 refills | Status: DC

## 2022-09-27 MED ORDER — AMIODARONE HCL 200 MG PO TABS: 200.0000 mg | ORAL_TABLET | Freq: Two times a day (BID) | ORAL | 0 refills | Status: DC

## 2022-09-27 MED ORDER — AMIODARONE HCL 200 MG PO TABS: 200.0000 mg | ORAL_TABLET | Freq: Every day | ORAL | 3 refills | Status: DC

## 2022-09-27 NOTE — Patient Instructions (Addendum)
Thank you for coming in today  If you had labs drawn today, any labs that are abnormal the clinic will call you No news is good news  Medications: STOP Losartan START Entresto 24/26 mg 1 tablet twice daily START Amiodarone 200 mg 1 tablet twice daily for 2 weeks then after 2 weeks take Amiodarone 200 mg 1 tablet daily    Follow up appointments: You have been order for sleep study [facility] will call you to make appointment   You will be called and for sleep study and pulmonary function test  Your physician recommends that you schedule a follow-up appointment in:  3 weeks in clinic   Do the following things EVERYDAY: Weigh yourself in the morning before breakfast. Write it down and keep it in a log. Take your medicines as prescribed Eat low salt foods--Limit salt (sodium) to 2000 mg per day.  Stay as active as you can everyday Limit all fluids for the day to less than 2 liters   At the [facility], you and your health needs are our priority. As part of our continuing mission to provide you with exceptional heart care, we have created designated Provider Care Teams. These Care Teams include your primary Cardiologist (physician) and Advanced Practice Providers (APPs- Physician Assistants and Nurse Practitioners) who all work together to provide you with the care you need, when you need it.   You may see any of the following providers on your designated Care Team at your next follow up: Dr Arvilla Meres Dr Marca Ancona Dr. Marcos Eke, NP Robbie Lis, [place] [facility] [place], [place] Brynda Peon, NP Karle Plumber, PharmD   Please be sure to bring in all your medications bottles to every appointment.    Thank you for choosing [facility]  If you have any questions or concerns before your next appointment please send [place] a message through [place] or call our office at  [phone].    TO LEAVE A MESSAGE FOR THE NURSE SELECT OPTION 2, PLEASE LEAVE A MESSAGE INCLUDING: YOUR NAME DATE OF BIRTH CALL BACK NUMBER REASON FOR CALL**this is important as we prioritize the call backs  YOU WILL RECEIVE A CALL BACK THE SAME DAY AS LONG AS YOU CALL BEFORE 4:00 PM

## 2022-09-27 NOTE — Progress Notes (Signed)
ReDS Vest / Clip - 09/27/22 0849       ReDS Vest / Clip   Station Marker D    Ruler Value 31    ReDS Value Range Low volume    ReDS Actual Value 34    Anatomical Comments lying

## 2022-09-27 NOTE — Telephone Encounter (Signed)
Advanced Heart Failure Patient Advocate Encounter  Patient expressed concerns about medication costs (Eliquis was in excess of $500). Review of Medicare plan does show high copays for Eliquis ($131 for 30 days), Entresto ($152 for 30 days). [place] shows a $33 copay for 90 day supply.  Based on patients current income I have recommended that he apply for the Medicare Extra Help Program. If this does not get approved, the patient is okay with applying to [employer] and [employer] to request assistance along with the Extra Help denial letter.  Patient will contact me once a determination has been made so that I can update this encounter, or begin application process.  Burnell Blanks, CPhT Rx Patient Advocate Phone: [phone]

## 2022-09-27 NOTE — Telephone Encounter (Signed)
Patient called.  Patient aware.  

## 2022-09-27 NOTE — Telephone Encounter (Signed)
-----   Message from Jacklynn Ganong sent at 09/27/2022 11:55 AM EDT ----- K is low. Start 40 KCL daily, but take 40 bid for today only. Repeat BMET w/ Mag in 1 week

## 2022-09-29 NOTE — Telephone Encounter (Signed)
Advanced Heart Failure Patient Advocate Encounter  Received call from [facility] they are unable to process Entresto free trial card due to Medicare coverage. Will see about providing samples to patient on Monday, informed him by phone.

## 2022-10-02 NOTE — Telephone Encounter (Signed)
Spoke to patient, pharmacy was able to process claim correctly and he has 1 month of medication. Patient has not applied for the Extra Help Program yet. I reminded him that we will need this approved to cover the cost of his meds, or a denial letter to apply for manufacturer assistance. He will call me with the determination.

## 2022-10-03 ENCOUNTER — Encounter: Payer: Self-pay | Admitting: Family Medicine

## 2022-10-04 NOTE — Progress Notes (Unsigned)
    SUBJECTIVE:   CHIEF COMPLAINT: a fib follow up HPI:   Shawn Perkins is a 67 y.o.  with history notable for HFrEF, new atrial fibrillation now on amiodarone, and tobacco use presenting for follow up.  He reports he paid $550 for his apixaban. He thinks he is taking his amiodarone--reports 1 time per day. Reports breathing is much better. No CP or palpitations. No edema. He is taking 40 mg lasix daily. Weight today 241--similar to prior. He is most concerned about the cost of his Entresto.  He reports his back is still 'bothersome'. This has been ongoing for years and limits his ability to complete household activities. Takes tylenol for this. No bowel/bladder symptoms or saddle anesthesia.  At his last visit with me he reported testicular edema. He declines exam (again) today and is certain it is better.     PERTINENT  PMH / PSH/Family/Social History : A fib, HF reduced EF   OBJECTIVE:   BP 113/87   Pulse 78   Ht 5\' 11"  (1.803 m)   Wt 241 lb 1.6 oz (109.4 kg)   SpO2 99%   BMI 33.63 kg/m   Today's weight:  Last Weight  Most recent update: 10/05/2022  9:28 AM    Weight  109.4 kg (241 lb 1.6 oz)            Review of prior weights: [employer]   10/05/22 0928  Weight: 241 lb 1.6 oz (109.4 kg)    Well appearing, appropriate Irregular rate and rhytm JVP at 2 cm above clavicle Lungs clear Abdomen soft and no fluid wave Trace edema   ASSESSMENT/PLAN:   Elevated bilirubin Suspect due to congestion Has completely stopped alcohol Repeat today   Atrial fibrillation (HCC) Will message HF PharmD and technician to see where they are with forms  Samples of Eliquis given today   Chronic left-sided low back pain without sciatica Slightly improved but given sciatica and long term nature will obtain MRI   Tobacco abuse Discussed cessation and resources provided  Trying to quit---is finding it much harder than alcohol Discussed low dose CT   HCM Information given  about low dose CT Discussed flu and covid this fall  Testicular swelling, resolved Discussed exam today Declined this--reviewed indications, risk   Medication Barriers He will call about amiodarone Will ask Pharmacy Team status of assistance for entresto      Terisa Starr, MD  Family Medicine Teaching Service  [facility]

## 2022-10-05 ENCOUNTER — Encounter: Payer: Self-pay | Admitting: Family Medicine

## 2022-10-05 ENCOUNTER — Ambulatory Visit: Payer: Medicare Other | Admitting: Family Medicine

## 2022-10-05 ENCOUNTER — Other Ambulatory Visit: Payer: Self-pay | Admitting: Family Medicine

## 2022-10-05 VITALS — BP 113/87 | HR 78 | Ht 71.0 in | Wt 241.1 lb

## 2022-10-05 DIAGNOSIS — G8929 Other chronic pain: Secondary | ICD-10-CM | POA: Diagnosis not present

## 2022-10-05 DIAGNOSIS — M545 Low back pain, unspecified: Secondary | ICD-10-CM

## 2022-10-05 DIAGNOSIS — F1010 Alcohol abuse, uncomplicated: Secondary | ICD-10-CM | POA: Diagnosis not present

## 2022-10-05 DIAGNOSIS — Z720 Tobacco use: Secondary | ICD-10-CM

## 2022-10-05 DIAGNOSIS — I480 Paroxysmal atrial fibrillation: Secondary | ICD-10-CM

## 2022-10-05 DIAGNOSIS — R17 Unspecified jaundice: Secondary | ICD-10-CM

## 2022-10-05 DIAGNOSIS — I5022 Chronic systolic (congestive) heart failure: Secondary | ICD-10-CM

## 2022-10-05 MED ORDER — APIXABAN 5 MG PO TABS: 5.0000 mg | ORAL_TABLET | Freq: Two times a day (BID) | ORAL | Status: DC

## 2022-10-05 NOTE — Assessment & Plan Note (Signed)
Discussed cessation and resources provided  Trying to quit---is finding it much harder than alcohol Discussed low dose CT

## 2022-10-05 NOTE — Assessment & Plan Note (Signed)
Slightly improved but given sciatica and long term nature will obtain MRI

## 2022-10-05 NOTE — Assessment & Plan Note (Signed)
Suspect due to congestion Has completely stopped alcohol Repeat today

## 2022-10-05 NOTE — Patient Instructions (Addendum)
It was wonderful to see you today.  Please bring ALL of your medications with you to every visit.   Today we talked about:  Call me and let me know if you are taking 'AMIODARONE" also called PACERONE 200 mg daily  I will call you with labs  I will reach out to pharmacy about your medications   You qualify for a lung cancer screening  I recommend considering a low dose CAT scan for this Please let me know if you want this   Tobacco use is damaging to your body. It increases your risk of stroke, heart attack, lung cancer, and serious lung disease in the future. It also reduces your fertility.   Quitting tobacco is the best thing for your health but is a challenge---nicotine, a chemical in cigarettes, is highly addictive.   You can call 1 800 QUIT NOW ([phone])---you will be connected with a Careers information officer. They can also mail you nicotine gums, lozenges, and patches to quit.   Ask me about patches (which you wear all day) and gums (which you use when you have a craving) to help you quit.   There are safe, effective medications to help you quit--  Varencline---also called Chantix---- is the most common medication used to help people stop smoking. It starts a low dose and is increased. I recommended choosing a quit date then starting the medication 8 days before this. Side effects include mild headache, difficulty sleeping, and odd dreams. The medication is typically very well tolerated.     Bupropion---also called Zyban---- is started 1 week before your quit date. You take 1 pill for three days then increase to 1 pill twice per day. Side effects include a mild headache and anxiety---this usually goes away. Some patients experience weight loss.        Please follow up in 3-6 months   Thank you for choosing [facility].   Please call [phone] with any questions about today's appointment.  Please be sure to schedule follow up at the front  desk before  you leave today.   Terisa Starr, MD  Family Medicine

## 2022-10-05 NOTE — Assessment & Plan Note (Signed)
Will message HF PharmD and technician to see where they are with forms  Samples of Eliquis given today

## 2022-10-06 ENCOUNTER — Telehealth: Payer: Self-pay | Admitting: Family Medicine

## 2022-10-06 ENCOUNTER — Ambulatory Visit (HOSPITAL_COMMUNITY)
Admission: RE | Admit: 2022-10-06 | Discharge: 2022-10-06 | Disposition: A | Discharge status: Home / Self Care | Payer: Medicare Other | Source: Ambulatory Visit | Attending: Cardiology | Admitting: Cardiology

## 2022-10-06 DIAGNOSIS — I5022 Chronic systolic (congestive) heart failure: Secondary | ICD-10-CM | POA: Diagnosis not present

## 2022-10-06 DIAGNOSIS — R17 Unspecified jaundice: Secondary | ICD-10-CM

## 2022-10-06 LAB — BASIC METABOLIC PANEL
Anion gap: 10 (ref 5–15)
BUN/Creatinine Ratio: 7 — ABNORMAL LOW (ref 10–24)
BUN: 8 mg/dL (ref 8–27)
BUN: 9 mg/dL (ref 8–23)
CO2: 21 mmol/L (ref 20–29)
CO2: 24 mmol/L (ref 22–32)
Calcium: 9.7 mg/dL (ref 8.6–10.2)
Calcium: 9 mg/dL (ref 8.9–10.3)
Chloride: 102 mmol/L (ref 96–106)
Chloride: 102 mmol/L (ref 98–111)
Creatinine, Ser: 1.08 mg/dL (ref 0.76–1.27)
Creatinine, Ser: 1.08 mg/dL (ref 0.61–1.24)
GFR, Estimated: >60 mL/min (ref >60)
Glucose, Bld: 93 mg/dL (ref 70–99)
Glucose: 95 mg/dL (ref 70–99)
Potassium: 4.9 mmol/L (ref 3.5–5.2)
Potassium: 3.8 mmol/L (ref 3.5–5.1)
Sodium: 140 mmol/L (ref 134–144)
Sodium: 136 mmol/L (ref 135–145)
eGFR: 75 mL/min/{1.73_m2} (ref >59)

## 2022-10-06 LAB — HEPATIC FUNCTION PANEL
ALT: 14 IU/L (ref 0–44)
AST: 17 IU/L (ref 0–40)
Albumin: 4.2 g/dL (ref 3.9–4.9)
Alkaline Phosphatase: 129 IU/L — ABNORMAL HIGH (ref 44–121)
Bilirubin Total: 1.8 mg/dL — ABNORMAL HIGH (ref 0.0–1.2)
Bilirubin, Direct: 0.34 mg/dL (ref 0.00–0.40)
Total Protein: 7 g/dL (ref 6.0–8.5)

## 2022-10-06 LAB — MAGNESIUM: Magnesium: 1.7 mg/dL (ref 1.7–2.4)

## 2022-10-06 NOTE — Telephone Encounter (Addendum)
Called with results. Bilirubin still elevated. Likely from A fib and HF.  Given alcohol use history, will obtain RUQ. Will get haptoglobin, LDH and smear at follow up as largely indirect.  Nursing please call to schedule RUQ ultrasound in late September in AM hours.

## 2022-10-06 NOTE — Telephone Encounter (Signed)
Appt scheduled for 11/14/22 @ 8am.  Pt informed of appt and the below:  NPO after midnight Arrive @ 7:45 Entrance A  Jone Baseman, CMA

## 2022-10-09 ENCOUNTER — Other Ambulatory Visit (HOSPITAL_COMMUNITY): Payer: Self-pay

## 2022-10-09 NOTE — Telephone Encounter (Signed)
Contacted patient for update, my understanding is that the Extra Help Application has been submitted, pt is awaiting determination. Will call for update next week unless patient contacts me with new information.

## 2022-10-11 ENCOUNTER — Ambulatory Visit (HOSPITAL_COMMUNITY): Payer: Medicare Other

## 2022-10-18 ENCOUNTER — Encounter (HOSPITAL_COMMUNITY): Payer: Self-pay

## 2022-10-18 ENCOUNTER — Telehealth (HOSPITAL_COMMUNITY): Payer: Self-pay

## 2022-10-18 ENCOUNTER — Ambulatory Visit (HOSPITAL_COMMUNITY)
Admission: RE | Admit: 2022-10-18 | Discharge: 2022-10-18 | Disposition: A | Discharge status: Home / Self Care | Payer: Medicare Other | Source: Ambulatory Visit | Attending: Family Medicine | Admitting: Family Medicine

## 2022-10-18 ENCOUNTER — Telehealth (HOSPITAL_COMMUNITY): Payer: Self-pay | Admitting: Pharmacy Technician

## 2022-10-18 ENCOUNTER — Other Ambulatory Visit (HOSPITAL_COMMUNITY): Payer: Self-pay

## 2022-10-18 VITALS — BP 102/68 | HR 86 | Wt 234.4 lb

## 2022-10-18 DIAGNOSIS — I428 Other cardiomyopathies: Secondary | ICD-10-CM | POA: Insufficient documentation

## 2022-10-18 DIAGNOSIS — F1091 Alcohol use, unspecified, in remission: Secondary | ICD-10-CM | POA: Insufficient documentation

## 2022-10-18 DIAGNOSIS — I10 Essential (primary) hypertension: Secondary | ICD-10-CM

## 2022-10-18 DIAGNOSIS — F17210 Nicotine dependence, cigarettes, uncomplicated: Secondary | ICD-10-CM | POA: Diagnosis not present

## 2022-10-18 DIAGNOSIS — I4891 Unspecified atrial fibrillation: Secondary | ICD-10-CM | POA: Insufficient documentation

## 2022-10-18 DIAGNOSIS — R0683 Snoring: Secondary | ICD-10-CM

## 2022-10-18 DIAGNOSIS — I5032 Chronic diastolic (congestive) heart failure: Secondary | ICD-10-CM | POA: Insufficient documentation

## 2022-10-18 DIAGNOSIS — I5022 Chronic systolic (congestive) heart failure: Secondary | ICD-10-CM

## 2022-10-18 DIAGNOSIS — R9431 Abnormal electrocardiogram [ECG] [EKG]: Secondary | ICD-10-CM | POA: Diagnosis not present

## 2022-10-18 DIAGNOSIS — Z7901 Long term (current) use of anticoagulants: Secondary | ICD-10-CM | POA: Diagnosis not present

## 2022-10-18 DIAGNOSIS — Z720 Tobacco use: Secondary | ICD-10-CM

## 2022-10-18 DIAGNOSIS — I110 Hypertensive heart disease with heart failure: Secondary | ICD-10-CM | POA: Diagnosis present

## 2022-10-18 LAB — CBC
HCT: 48 % (ref 39.0–52.0)
Hemoglobin: 15.9 g/dL (ref 13.0–17.0)
MCH: 30.9 pg (ref 26.0–34.0)
MCHC: 33.1 g/dL (ref 30.0–36.0)
MCV: 93.2 fL (ref 80.0–100.0)
Platelets: 192 10*3/uL (ref 150–400)
RBC: 5.15 MIL/uL (ref 4.22–5.81)
RDW: 14.4 % (ref 11.5–15.5)
WBC: 6 10*3/uL (ref 4.0–10.5)
nRBC: 0 % (ref 0.0–0.2)

## 2022-10-18 LAB — BASIC METABOLIC PANEL
Anion gap: 10 (ref 5–15)
BUN: 19 mg/dL (ref 8–23)
CO2: 22 mmol/L (ref 22–32)
Calcium: 10 mg/dL (ref 8.9–10.3)
Chloride: 102 mmol/L (ref 98–111)
Creatinine, Ser: 1.42 mg/dL — ABNORMAL HIGH (ref 0.61–1.24)
GFR, Estimated: 54 mL/min — ABNORMAL LOW (ref >60)
Glucose, Bld: 94 mg/dL (ref 70–99)
Potassium: 6.1 mmol/L — ABNORMAL HIGH (ref 3.5–5.1)
Sodium: 134 mmol/L — ABNORMAL LOW (ref 135–145)

## 2022-10-18 MED ORDER — DAPAGLIFLOZIN PROPANEDIOL 10 MG PO TABS: 10.0000 mg | ORAL_TABLET | Freq: Every day | ORAL | 5 refills | Status: DC

## 2022-10-18 MED ORDER — FUROSEMIDE 40 MG PO TABS
40.0000 mg | ORAL_TABLET | Freq: Every day | ORAL | 1 refills | Status: DC | PRN
Start: 2022-10-18 — End: 2023-04-19

## 2022-10-18 MED ORDER — POTASSIUM CHLORIDE CRYS ER 20 MEQ PO TBCR: 40.0000 meq | EXTENDED_RELEASE_TABLET | Freq: Every day | ORAL | 3 refills | Status: DC | PRN

## 2022-10-18 MED ORDER — LOKELMA 10 G PO PACK: 10.0000 g | PACK | ORAL | 0 refills | Status: DC

## 2022-10-18 NOTE — Telephone Encounter (Signed)
-----   Message from Anderson Malta [place] sent at 10/18/2022 12:39 PM EDT ----- Kidney function mildly elevated but K is too high.  Stop Entresto and spiro. Do not take anymore KCL (we changed this to PRN w/ Lasix use).  He needs Lokelma 10 g x 1 today. Repeat BMET on Friday.

## 2022-10-18 NOTE — Progress Notes (Signed)
[facility]   Patient ID: Shawn Perkins, male   DOB: [birth year], 67 y.o.   MRN: [id number]  PCP: Westley Chandler, MD HF Cardiology: Dr. Shirlee Latch  HPI: Shawn Perkins is a 67 y.o. African-American male with a h/o severe HTN, tobacco and ETOH use, and chronic systolic HF/NICM.   Cath (11/11/10) which showed normal cors. EF 20%, RA 7, RV 51/4, PA 61/30, with a mean of 47, PCWP 30, CO 4.0. He was diuresed aggressively and started on GDMT. He was discharged home, weight was 219 lbs.  Admitted 10/14 with a/c systolic heart failure in setting of medication noncompliance x several months, and severe HTN. HF medications restarted. Echo showed EF back down to 35-40% from 55%.  He was discharged home, weight 214 lbs.  Last seen 04/2020. Stable NYHA II and volume stable. Plan to repeat echo in 1 year.  Saw PCP 09/08/22 with SOB x 1 month. Found to be in new AF, rate controlled, and volume overloaded. Advised evaluation in ED, but patient declined. Started on Eliquis. BNP 600, and Lasix increased to bid. Echo 7/24 showed EF 45%, RV normal, moderate to severe MR and TR  Follow up 8/24, remained in rate controlled atrial fibrillation. Started on amiodarone, losartan stopped and Entresto started.   Today he returns for HF follow up. Overall feeling fine. He is not SOB walking on flat ground, main complaint is chronic low back pain. Occasional positional dizziness, no falls. Denies CP, palpitations, edema, abnormal bleeding, or PND/Orthopnea. Appetite ok. No fever or chills. Weight at home 236-237 pounds. Taking all medications. Has not missed and Eliquis doses. Smokes 3 cigs/day, no further ETOH use.  ECG (personally reviewed): atrial fibrillation 80 bpm  Labs (5/20): K 4.1, creatinine 1.11, LDL 93  Labs (7/24): K 3.6, creatinine 1.14, TSH normal Labs (8/24): K 3.8, creatinine 1.08  Cardiac Studies - R/LHC (9/12): normal cors, RA 7, RV 51/4, PA 61/30, with a mean of 47, PCWP 30, CO 4.0. -  Echo (12/12): EF 55-60% - Echo (10/14): EF 35-40%  - Echo (4/15): EF 50-55%  - Echo (5/18): EF 55-60%, moderate LVH, normal RV size and systolic function. - Echo (2020): EF 55-60%  - Echo (7/24, in Afib): EF 45%, normal RV, moderate to severe MR, moderate to severe TR  ABIs (3/19): Normal.   ROS: All systems negative except as listed in HPI, PMH and Problem List.  Past Medical History:  Diagnosis Date   CHF (congestive heart failure) (HCC)    Diverticulosis    Heart murmur    History of cocaine abuse (HCC)    use to smoke crack cocaine, last use was more than 6 years ago   Hypertension    Rheumatic fever    as a child   Systolic heart failure    a. 11/09/10 echo EF 35%, b. 11/11/10 cath norm cors c. ECHO (11/2012) EF 35-50%   Umbilical hernia, incarcerated 11/2012   11/2012 exploratory lap with small bwl resection   Current Outpatient Medications  Medication Sig Dispense Refill   amiodarone (PACERONE) 200 MG tablet Take 1 tablet (200 mg total) by mouth daily. 90 tablet 3   apixaban (ELIQUIS) 5 MG TABS tablet Take 1 tablet (5 mg total) by mouth 2 (two) times daily.     aspirin EC 81 MG tablet Take 1 tablet (81 mg total) by mouth daily. 90 tablet 3   atorvastatin (LIPITOR) 40 MG tablet Take 1 tablet (40 mg total) by mouth  daily. 90 tablet 3   carvedilol (COREG) 25 MG tablet Take 1 tablet (25 mg total) by mouth 2 (two) times daily. 180 tablet 3   furosemide (LASIX) 40 MG tablet Take 1 tablet (40 mg total) by mouth daily. PLEASE CALL THE OFFICE TO ARRANGE YOUR FOLLOW UP APPOINTMENT 90 tablet 1   potassium chloride SA (KLOR-CON M) 20 MEQ tablet Take 2 tablets (40 mEq total) by mouth 2 (two) times daily for 1 day, THEN 2 tablets (40 mEq total) daily. 90 tablet 3   sacubitril-valsartan (ENTRESTO) 24-26 MG Take 1 tablet by mouth 2 (two) times daily. 60 tablet 6   spironolactone (ALDACTONE) 25 MG tablet TAKE 1 TABLET (25 MG TOTAL) BY MOUTH DAILY. 90 tablet 3   vitamin B-12 (CYANOCOBALAMIN)  1000 MCG tablet Take 1 tablet (1,000 mcg total) by mouth daily. 90 tablet 3   Current Facility-Administered Medications  Medication Dose Route Frequency Provider Last Rate Last Admin   0.9 %  sodium chloride infusion  500 mL Intravenous Continuous [name], [name], MD       BP 102/68   Pulse 86   Wt 106.3 kg (234 lb 6.4 oz)   SpO2 98%   BMI 32.69 kg/m   Wt Readings from Last 3 Encounters:  10/18/22 106.3 kg (234 lb 6.4 oz)  10/05/22 109.4 kg (241 lb 1.6 oz)  09/27/22 108.5 kg (239 lb 3.2 oz)   PHYSICAL EXAM: General:  NAD. No resp difficulty, walked into clinic HEENT: Normal Neck: Supple. No JVD. Carotids 2+ bilat; no bruits. No lymphadenopathy or thryomegaly appreciated. Cor: PMI nondisplaced. Irregular rate & rhythm. No rubs, gallops or murmurs. Lungs: Clear Abdomen: Soft, nontender, nondistended. No hepatosplenomegaly. No bruits or masses. Good bowel sounds. + umbilical hernia Extremities: No cyanosis, clubbing, rash, edema Neuro: Alert & oriented x 3, cranial nerves grossly intact. Moves all 4 extremities w/o difficulty. Affect pleasant.  ASSESSMENT & PLAN: 1. Chronic Diastolic Heart Failure: Nonischemic cardiomyopathy, possibly related to HTN. Echo (2022) EF 55-60%. New AFib 7/24, echo (7/24) showed EF 45%, normal RV, moderate to severe TR/MR. Suspect drop in EF tachy-mediated. No chest pain or obvious ACS, defer LHC for now. NYHA II-IIb, he is not volume overloaded today. - Start Farxiga 10 mg daily. Discussed potential SE. - Change Lasix to 40 mg PRN w/ 40 KCL PRN - Continue Entresto 24/26 mg bid. BMET/BNP today. - Continue spiro 25 mg daily.  - Continue Coreg 25 mg bid.  2. Atrial fibrillation: Newly diagnosed 7/24. Rate controlled. TSH normal. Remains in AFib on ECG today, rate controlled. - Continue amiodarone 200 mg daily.  - Continue Eliquis 5 mg bid. He has not missed any doses. - In lab sleep study has been arranged. - Recommend DCCV with amio support to  establish NSR. He is agreeable, will arrange with Dr. Shirlee Latch Informed Consent   Shared Decision Making/Informed Consent The risks (stroke, cardiac arrhythmias rarely resulting in the need for a temporary or permanent pacemaker, skin irritation or burns and complications associated with conscious sedation including aspiration, arrhythmia, respiratory failure and death), benefits (restoration of normal sinus rhythm) and alternatives of a direct current cardioversion were explained in detail to Shawn Perkins and he agrees to proceed.       3. HTN: Stable.  - GDMT as above. 4. Tobacco use: Discussed cessation. He is cutting down  5. ETOH: Quit x 3-4 weeks. - Congratulated.  Follow up with APP 2 weeks after DCCV.  Anderson Malta [facility] FNP-BC  10/18/2022

## 2022-10-18 NOTE — Telephone Encounter (Signed)
Advanced Heart Failure Patient Advocate Encounter  Sent in application via fax.  Will follow up.  

## 2022-10-18 NOTE — Telephone Encounter (Signed)
Advanced Heart Failure Patient Advocate Encounter  The patient was approved for a [employer] grant that will help cover the cost of Entresto, Farxiga, Coreg and Spironolactone. Total amount awarded, $10,000. Eligibility, 09/18/22 - 09/17/23.  ID [id number]  BIN [id number]  PCN [id number]  Group [id number]  Patient was provided copy while in clinic.   Archer Asa, CPhT

## 2022-10-18 NOTE — Patient Instructions (Addendum)
Medication Changes:  START: FARXIGA 10MG  ONCE DAILY   TAKE YOUR LASIX (FUROSEMIDE) 40MG  AS NEEDED FOR SWELLING AND WEIGHT GAIN- 3 POUNDS OVERNIGHT OR 5 POUNDS IN ONE WEEK  WHEN YOU TAKE YOUR FUROSEMIDE YOU NEED TO TAKE POTASSIUM WITH IT   SAMPLES OF ELIQUIS GIVEN TODAY  Lab Work:  Labs done today, your results will be available in MyChart, we will contact you for abnormal readings.  Testing/Procedures:  Your physician has recommended that you have a Cardioversion (DCCV). Electrical Cardioversion uses a jolt of electricity to your heart either through paddles or wired patches attached to your chest. This is a controlled, usually prescheduled, procedure. Defibrillation is done under light anesthesia in the hospital, and you usually go home the day of the procedure. This is done to get your heart back into a normal rhythm. You are not awake for the procedure. Please see the instruction sheet given to you today.  Special Instructions // Education:    Dear Shawn Perkins   You are scheduled for a Cardioversion on TUESDAY, September 17th with Dr. Shirlee Latch.  Please arrive at the [facility] (Main Entrance A) at [facility]: [address] [place], [place] [address] at 7:30 (This time is 1 hour(s) before your procedure to ensure your preparation). Free valet parking service is available. You will check in at ADMITTING. The support person will be asked to wait in the waiting room.  It is OK to have someone drop you off and come back when you are ready to be discharged.     DIET:  Nothing to eat or drink after midnight except a sip of water with medications (see medication instructions below)  MEDICATION INSTRUCTIONS:  DO NOT TAKE FARXIGA FOR 3 DAYS PRIOR TO PROCEDURE. DO NOT TAKE ANY ON SATURDAY, SUNDAY, MONDAY OR THE MORNING OF PROCEDURE   DO NOT TAKE LASIX THE MORNING OF PROCEDURE   DO NOT TAKE SPIRONOLACTONE THE MORNING OF PROCEDURE   PLEASE MAKE SURE TO NOT MISS ANY DOSES OF  ELIQUIS   LABS: TODAY  FYI:  For your safety, and to allow [place] to monitor your vital signs accurately during the surgery/procedure we request: If you have artificial nails, gel coating, SNS etc, please have those removed prior to your surgery/procedure. Not having the nail coverings /polish removed may result in cancellation or delay of your surgery/procedure.  You must have a responsible person to drive you home and stay in the waiting area during your procedure. Failure to do so could result in cancellation.  Bring your insurance cards.  *Special Note: Every effort is made to have your procedure done on time. Occasionally there are emergencies that occur at the hospital that may cause delays. Please be patient if a delay does occur.   Follow-Up in: 2 WEEKS AFTER YOUR PROCEDURE AS SCHEDULED.   THEN 3 MONTHS WITH DR. Shirlee Latch AS SCHEDULED WITH ECHO  At the [facility], you and your health needs are our priority. We have a designated team specialized in the treatment of Heart Failure. This Care Team includes your primary Heart Failure Specialized Cardiologist (physician), Advanced Practice Providers (APPs- Physician Assistants and Nurse Practitioners), and Pharmacist who all work together to provide you with the care you need, when you need it.   You may see any of the following providers on your designated Care Team at your next follow up:  Dr. Arvilla Meres Dr. Marca Ancona Dr. Dorthula Nettles Amy Filbert Schilder, NP Robbie Lis, [place]  Mikki Santee [place], [place] Brynda Peon, NP Karle Plumber, PharmD   Please be sure to bring in all your medications bottles to every appointment.   Need to Contact us:  If you have any questions or concerns before your next appointment please send [place] a message through [place] or call our office at [phone].    TO LEAVE A MESSAGE FOR THE NURSE SELECT OPTION 2, PLEASE LEAVE A MESSAGE INCLUDING: YOUR NAME DATE OF BIRTH CALL  BACK NUMBER REASON FOR CALL**this is important as we prioritize the call backs  YOU WILL RECEIVE A CALL BACK THE SAME DAY AS LONG AS YOU CALL BEFORE 4:00 PM

## 2022-10-18 NOTE — Progress Notes (Signed)
Medication Samples have been provided to the patient.  Drug name: Eliquis       Strength: 5 mg        Qty: 56  LOT: [id number]  Exp.Date: October 2025  Dosing instructions: Patient takes 1 tablet by mouth twice a day.  The patient has been instructed regarding the correct time, dose, and frequency of taking this medication, including desired effects and most common side effects.   Jamylah Marinaccio M Anjannette Gauger 10:30 AM 10/18/2022

## 2022-10-18 NOTE — Progress Notes (Signed)
Orders placed for DCCV with Dr. Shirlee Latch scheduled for 11/07/22

## 2022-10-18 NOTE — Telephone Encounter (Signed)
Patient advised and verbalized understanding,lab appointment scheduled,lab orders entered. Med list updated to reflect changes,will send lokelma into patients pharmacy since patient would have a hard time getting back to the office. Will send in a 30 day supply of the lokelma so insurance will cover it however instructed patient to only use 1 packet one time.   Meds ordered this encounter  Medications   sodium zirconium cyclosilicate (LOKELMA) 10 g PACK packet    Sig: Take 10 g by mouth as directed.    Dispense:  30 packet    Refill:  0   Orders Placed This Encounter  Procedures   Basic metabolic panel    Standing Status:   Future    Standing Expiration Date:   10/18/2023    Order Specific Question:   Release to patient    Answer:   Immediate    Order Specific Question:   Release to patient    Answer:   Immediate [1]

## 2022-10-18 NOTE — Telephone Encounter (Signed)
Advanced Heart Failure Patient Advocate Encounter  Patient was seen in clinic today and needs help with Eliquis co-pay ($131).  Started [employer] patient assistance application. The patient will need OOP showing 3% of income has been spent this year for approval.

## 2022-10-18 NOTE — H&P (View-Only) (Signed)
[facility]   Patient ID: Shawn Perkins, male   DOB: 12-29-1955, 67 y.o.   MRN: [id number]  PCP: Westley Chandler, MD HF Cardiology: Dr. Shirlee Latch  HPI: Shawn Perkins is a 67 y.o. African-American male with a h/o severe HTN, tobacco and ETOH use, and chronic systolic HF/NICM.   Cath (11/11/10) which showed normal cors. EF 20%, RA 7, RV 51/4, PA 61/30, with a mean of 47, PCWP 30, CO 4.0. He was diuresed aggressively and started on GDMT. He was discharged home, weight was 219 lbs.  Admitted 10/14 with a/c systolic heart failure in setting of medication noncompliance x several months, and severe HTN. HF medications restarted. Echo showed EF back down to 35-40% from 55%.  He was discharged home, weight 214 lbs.  Last seen 04/2020. Stable NYHA II and volume stable. Plan to repeat echo in 1 year.  Saw PCP 09/08/22 with SOB x 1 month. Found to be in new AF, rate controlled, and volume overloaded. Advised evaluation in ED, but patient declined. Started on Eliquis. BNP 600, and Lasix increased to bid. Echo 7/24 showed EF 45%, RV normal, moderate to severe MR and TR  Follow up 8/24, remained in rate controlled atrial fibrillation. Started on amiodarone, losartan stopped and Entresto started.   Today he returns for HF follow up. Overall feeling fine. He is not SOB walking on flat ground, main complaint is chronic low back pain. Occasional positional dizziness, no falls. Denies CP, palpitations, edema, abnormal bleeding, or PND/Orthopnea. Appetite ok. No fever or chills. Weight at home 236-237 pounds. Taking all medications. Has not missed and Eliquis doses. Smokes 3 cigs/day, no further ETOH use.  ECG (personally reviewed): atrial fibrillation 80 bpm  Labs (5/20): K 4.1, creatinine 1.11, LDL 93  Labs (7/24): K 3.6, creatinine 1.14, TSH normal Labs (8/24): K 3.8, creatinine 1.08  Cardiac Studies - R/LHC (9/12): normal cors, RA 7, RV 51/4, PA 61/30, with a mean of 47, PCWP 30, CO 4.0. -  Echo (12/12): EF 55-60% - Echo (10/14): EF 35-40%  - Echo (4/15): EF 50-55%  - Echo (5/18): EF 55-60%, moderate LVH, normal RV size and systolic function. - Echo (2020): EF 55-60%  - Echo (7/24, in Afib): EF 45%, normal RV, moderate to severe MR, moderate to severe TR  ABIs (3/19): Normal.   ROS: All systems negative except as listed in HPI, PMH and Problem List.  Past Medical History:  Diagnosis Date   CHF (congestive heart failure) (HCC)    Diverticulosis    Heart murmur    History of cocaine abuse (HCC)    use to smoke crack cocaine, last use was more than 6 years ago   Hypertension    Rheumatic fever    as a child   Systolic heart failure    a. 11/09/10 echo EF 35%, b. 11/11/10 cath norm cors c. ECHO (11/2012) EF 35-50%   Umbilical hernia, incarcerated 11/2012   11/2012 exploratory lap with small bwl resection   Current Outpatient Medications  Medication Sig Dispense Refill   amiodarone (PACERONE) 200 MG tablet Take 1 tablet (200 mg total) by mouth daily. 90 tablet 3   apixaban (ELIQUIS) 5 MG TABS tablet Take 1 tablet (5 mg total) by mouth 2 (two) times daily.     aspirin EC 81 MG tablet Take 1 tablet (81 mg total) by mouth daily. 90 tablet 3   atorvastatin (LIPITOR) 40 MG tablet Take 1 tablet (40 mg total) by mouth  daily. 90 tablet 3   carvedilol (COREG) 25 MG tablet Take 1 tablet (25 mg total) by mouth 2 (two) times daily. 180 tablet 3   furosemide (LASIX) 40 MG tablet Take 1 tablet (40 mg total) by mouth daily. PLEASE CALL THE OFFICE TO ARRANGE YOUR FOLLOW UP APPOINTMENT 90 tablet 1   potassium chloride SA (KLOR-CON M) 20 MEQ tablet Take 2 tablets (40 mEq total) by mouth 2 (two) times daily for 1 day, THEN 2 tablets (40 mEq total) daily. 90 tablet 3   sacubitril-valsartan (ENTRESTO) 24-26 MG Take 1 tablet by mouth 2 (two) times daily. 60 tablet 6   spironolactone (ALDACTONE) 25 MG tablet TAKE 1 TABLET (25 MG TOTAL) BY MOUTH DAILY. 90 tablet 3   vitamin B-12 (CYANOCOBALAMIN)  1000 MCG tablet Take 1 tablet (1,000 mcg total) by mouth daily. 90 tablet 3   Current Facility-Administered Medications  Medication Dose Route Frequency Provider Last Rate Last Admin   0.9 %  sodium chloride infusion  500 mL Intravenous Continuous [name], [name], MD       BP 102/68   Pulse 86   Wt 106.3 kg (234 lb 6.4 oz)   SpO2 98%   BMI 32.69 kg/m   Wt Readings from Last 3 Encounters:  10/18/22 106.3 kg (234 lb 6.4 oz)  10/05/22 109.4 kg (241 lb 1.6 oz)  09/27/22 108.5 kg (239 lb 3.2 oz)   PHYSICAL EXAM: General:  NAD. No resp difficulty, walked into clinic HEENT: Normal Neck: Supple. No JVD. Carotids 2+ bilat; no bruits. No lymphadenopathy or thryomegaly appreciated. Cor: PMI nondisplaced. Irregular rate & rhythm. No rubs, gallops or murmurs. Lungs: Clear Abdomen: Soft, nontender, nondistended. No hepatosplenomegaly. No bruits or masses. Good bowel sounds. + umbilical hernia Extremities: No cyanosis, clubbing, rash, edema Neuro: Alert & oriented x 3, cranial nerves grossly intact. Moves all 4 extremities w/o difficulty. Affect pleasant.  ASSESSMENT & PLAN: 1. Chronic Diastolic Heart Failure: Nonischemic cardiomyopathy, possibly related to HTN. Echo (2022) EF 55-60%. New AFib 7/24, echo (7/24) showed EF 45%, normal RV, moderate to severe TR/MR. Suspect drop in EF tachy-mediated. No chest pain or obvious ACS, defer LHC for now. NYHA II-IIb, he is not volume overloaded today. - Start Farxiga 10 mg daily. Discussed potential SE. - Change Lasix to 40 mg PRN w/ 40 KCL PRN - Continue Entresto 24/26 mg bid. BMET/BNP today. - Continue spiro 25 mg daily.  - Continue Coreg 25 mg bid.  2. Atrial fibrillation: Newly diagnosed 7/24. Rate controlled. TSH normal. Remains in AFib on ECG today, rate controlled. - Continue amiodarone 200 mg daily.  - Continue Eliquis 5 mg bid. He has not missed any doses. - In lab sleep study has been arranged. - Recommend DCCV with amio support to  establish NSR. He is agreeable, will arrange with Dr. Shirlee Latch Informed Consent   Shared Decision Making/Informed Consent The risks (stroke, cardiac arrhythmias rarely resulting in the need for a temporary or permanent pacemaker, skin irritation or burns and complications associated with conscious sedation including aspiration, arrhythmia, respiratory failure and death), benefits (restoration of normal sinus rhythm) and alternatives of a direct current cardioversion were explained in detail to Shawn Perkins and he agrees to proceed.       3. HTN: Stable.  - GDMT as above. 4. Tobacco use: Discussed cessation. He is cutting down  5. ETOH: Quit x 3-4 weeks. - Congratulated.  Follow up with APP 2 weeks after DCCV.  Anderson Malta [facility] FNP-BC  10/18/2022

## 2022-10-19 ENCOUNTER — Other Ambulatory Visit (HOSPITAL_COMMUNITY): Payer: Self-pay

## 2022-10-19 NOTE — Telephone Encounter (Signed)
Contacted patient for update. Patient has not received a determination for the Extra Help Program, and test claims still return high copays (Eliquis $131 etc). Advised pt to contact me if there are issues, or if a determination is received

## 2022-10-20 ENCOUNTER — Other Ambulatory Visit (HOSPITAL_COMMUNITY): Payer: Medicare Other

## 2022-10-24 ENCOUNTER — Other Ambulatory Visit (HOSPITAL_COMMUNITY): Payer: Self-pay

## 2022-10-24 ENCOUNTER — Ambulatory Visit (HOSPITAL_COMMUNITY)
Admission: RE | Admit: 2022-10-24 | Discharge: 2022-10-24 | Disposition: A | Discharge status: Home / Self Care | Payer: Medicare Other | Source: Ambulatory Visit | Attending: Family Medicine | Admitting: Family Medicine

## 2022-10-24 DIAGNOSIS — G8929 Other chronic pain: Secondary | ICD-10-CM | POA: Insufficient documentation

## 2022-10-24 DIAGNOSIS — M545 Low back pain, unspecified: Secondary | ICD-10-CM | POA: Diagnosis present

## 2022-10-24 DIAGNOSIS — M48061 Spinal stenosis, lumbar region without neurogenic claudication: Secondary | ICD-10-CM | POA: Insufficient documentation

## 2022-10-24 DIAGNOSIS — I5022 Chronic systolic (congestive) heart failure: Secondary | ICD-10-CM | POA: Diagnosis present

## 2022-10-24 DIAGNOSIS — M5126 Other intervertebral disc displacement, lumbar region: Secondary | ICD-10-CM | POA: Diagnosis not present

## 2022-10-24 LAB — BASIC METABOLIC PANEL
Anion gap: 9 (ref 5–15)
BUN: 10 mg/dL (ref 8–23)
CO2: 22 mmol/L (ref 22–32)
Calcium: 9.5 mg/dL (ref 8.9–10.3)
Chloride: 104 mmol/L (ref 98–111)
Creatinine, Ser: 1.15 mg/dL (ref 0.61–1.24)
GFR, Estimated: >60 mL/min (ref >60)
Glucose, Bld: 119 mg/dL — ABNORMAL HIGH (ref 70–99)
Potassium: 4.1 mmol/L (ref 3.5–5.1)
Sodium: 135 mmol/L (ref 135–145)

## 2022-10-24 NOTE — Telephone Encounter (Signed)
Spoke to patient by phone. He has been approved for a grant, and Eliquis application has been sent to [employer] in alternate encounters. Patient is going to pick up medications with the pharmacy today or tomorrow and will call if there are issues with the pricing.

## 2022-10-25 ENCOUNTER — Telehealth (HOSPITAL_COMMUNITY): Payer: Self-pay | Admitting: Pharmacy Technician

## 2022-10-25 ENCOUNTER — Other Ambulatory Visit (HOSPITAL_COMMUNITY): Payer: Self-pay

## 2022-10-25 ENCOUNTER — Ambulatory Visit (HOSPITAL_COMMUNITY)
Admission: RE | Admit: 2022-10-25 | Discharge: 2022-10-25 | Disposition: A | Discharge status: Home / Self Care | Payer: Medicare Other | Source: Ambulatory Visit | Attending: Family Medicine | Admitting: Family Medicine

## 2022-10-25 DIAGNOSIS — M545 Low back pain, unspecified: Secondary | ICD-10-CM

## 2022-10-25 DIAGNOSIS — M48061 Spinal stenosis, lumbar region without neurogenic claudication: Secondary | ICD-10-CM | POA: Diagnosis not present

## 2022-10-25 NOTE — Telephone Encounter (Signed)
Advanced Heart Failure Patient Advocate Encounter  Patient stated that he could not afford Lokelma RX that was sent in ($181/30 days). He has not picked up the [facility]. Noticed that he was to take one dose and repeat labs. Labs were repeated yesterday. One packet would be $6.05.   Will route information to provider. Not sure if we should advise the patient to pick up or not at this time.   Archer Asa, CPhT

## 2022-10-25 NOTE — Telephone Encounter (Signed)
Agree, pick up sample.

## 2022-10-26 NOTE — Telephone Encounter (Signed)
Spoke to patient by phone, he will pick up 1 dose from his preferred pharmacy 10/26/22.  Pt will contact office if anything changes or if he would like to obtain medication from [facility] instead.

## 2022-10-27 ENCOUNTER — Other Ambulatory Visit: Payer: Self-pay | Admitting: Family Medicine

## 2022-10-27 ENCOUNTER — Telehealth: Payer: Self-pay

## 2022-10-27 ENCOUNTER — Telehealth: Payer: Self-pay | Admitting: Family Medicine

## 2022-10-27 DIAGNOSIS — I4891 Unspecified atrial fibrillation: Secondary | ICD-10-CM

## 2022-10-27 NOTE — Telephone Encounter (Signed)
Patient calls nurse line requesting to speak with PCP.   He reports he has had two episodes of nose bleeds since Monday. He reports he only noticed when he blew his nose. He reports "maybe a nickel" size amount. He denies any continuous bleeding.   Patient does take Eliquis daily.   He denies any SOB or feelings of fatigue.   Patient advised to use a humidifier in his home. Advised clinic visit next week if episodes persist over the weekend.  ED precautions discussed.   Will forward to PCP for further recommendations.

## 2022-10-27 NOTE — Telephone Encounter (Signed)
CBC placed as future order

## 2022-10-27 NOTE — Telephone Encounter (Signed)
Please schedule for a visit to check CBC next week. Please ask about duration of epistaxis. He should apply pressure for 10 minutes and lean forward after episode.   Terisa Starr, MD  Family Medicine Teaching Service

## 2022-10-27 NOTE — Telephone Encounter (Signed)
Spoke with patient he explained his nose bleeds only last about 1 minute, informed him to hold his nose and lean back after an episode. Was able to schedule a lab visit on 11/01/22 at 8:30. Penni Bombard CMA

## 2022-10-31 NOTE — Telephone Encounter (Signed)
Advanced Heart Failure Patient Advocate Encounter  Called and spoke with the patient. Reminded him of OOP requirement.

## 2022-11-01 ENCOUNTER — Other Ambulatory Visit: Payer: Medicare Other

## 2022-11-01 DIAGNOSIS — I4891 Unspecified atrial fibrillation: Secondary | ICD-10-CM

## 2022-11-01 NOTE — Telephone Encounter (Signed)
Advanced Heart Failure Patient Advocate Encounter  Patient called to say he dropped off OOP report. He accidentally sent it to his PCP. Advised patient I would call PCP office and have them fax it over. Called office, they close at 1230 on Wednesdays. Will call again tomorrow.

## 2022-11-01 NOTE — Telephone Encounter (Signed)
Advanced Heart Failure Patient Advocate Encounter  Patient asked for a return call regarding OOP requirement. Called patient back, clarified question. Advised him to call back if he has further questions/concerns.

## 2022-11-02 ENCOUNTER — Telehealth: Payer: Self-pay | Admitting: Family Medicine

## 2022-11-02 ENCOUNTER — Encounter: Payer: Self-pay | Admitting: Family Medicine

## 2022-11-02 LAB — CBC
Hematocrit: 41.4 % (ref 37.5–51.0)
Hemoglobin: 13.9 g/dL (ref 13.0–17.7)
MCH: 32.2 pg (ref 26.6–33.0)
MCHC: 33.6 g/dL (ref 31.5–35.7)
MCV: 96 fL (ref 79–97)
Platelets: 154 10*3/uL (ref 150–450)
RBC: 4.32 x10E6/uL (ref 4.14–5.80)
RDW: 13.6 % (ref 11.6–15.4)
WBC: 5 10*3/uL (ref 3.4–10.8)

## 2022-11-02 NOTE — Telephone Encounter (Signed)
Advanced Heart Failure Patient Advocate Encounter  Called office and was transferred to nurse line where I left a vm to either fax me the OOP or return the call.  Will follow up.

## 2022-11-02 NOTE — Telephone Encounter (Signed)
Attempted to call patient. Unable to leave message. Normal results, will send letter. Terisa Starr, MD  Family Medicine Teaching Service

## 2022-11-06 ENCOUNTER — Ambulatory Visit (HOSPITAL_COMMUNITY)
Admission: RE | Admit: 2022-11-06 | Discharge: 2022-11-06 | Disposition: A | Discharge status: Home / Self Care | Payer: Medicare Other | Source: Ambulatory Visit | Attending: Family Medicine | Admitting: Family Medicine

## 2022-11-06 ENCOUNTER — Encounter: Payer: Self-pay | Admitting: Family Medicine

## 2022-11-06 DIAGNOSIS — Z720 Tobacco use: Secondary | ICD-10-CM | POA: Diagnosis present

## 2022-11-06 DIAGNOSIS — J449 Chronic obstructive pulmonary disease, unspecified: Secondary | ICD-10-CM | POA: Insufficient documentation

## 2022-11-06 DIAGNOSIS — R942 Abnormal results of pulmonary function studies: Secondary | ICD-10-CM | POA: Diagnosis not present

## 2022-11-06 DIAGNOSIS — R0609 Other forms of dyspnea: Secondary | ICD-10-CM | POA: Diagnosis not present

## 2022-11-06 DIAGNOSIS — F17210 Nicotine dependence, cigarettes, uncomplicated: Secondary | ICD-10-CM | POA: Insufficient documentation

## 2022-11-06 LAB — PULMONARY FUNCTION TEST
DL/VA % pred: 68 %
DL/VA: 2.79 ml/min/mmHg/L
DLCO cor % pred: 50 %
DLCO cor: 13.78 ml/min/mmHg
DLCO unc % pred: 49 %
DLCO unc: 13.5 ml/min/mmHg
FEF 25-75 Post: 1.53 L/s
FEF 25-75 Pre: 1.16 L/s
FEF2575-%Change-Post: 31 %
FEF2575-%Pred-Post: 56 %
FEF2575-%Pred-Pre: 42 %
FEV1-%Change-Post: 5 %
FEV1-%Pred-Post: 63 %
FEV1-%Pred-Pre: 60 %
FEV1-Post: 2.22 L
FEV1-Pre: 2.11 L
FEV1FVC-%Change-Post: 2 %
FEV1FVC-%Pred-Pre: 93 %
FEV6-%Change-Post: 2 %
FEV6-%Pred-Post: 68 %
FEV6-%Pred-Pre: 66 %
FEV6-Post: 3.06 L
FEV6-Pre: 2.97 L
FEV6FVC-%Change-Post: 0 %
FEV6FVC-%Pred-Post: 103 %
FEV6FVC-%Pred-Pre: 103 %
FVC-%Change-Post: 2 %
FVC-%Pred-Post: 66 %
FVC-%Pred-Pre: 64 %
FVC-Post: 3.11 L
FVC-Pre: 3.04 L
Post FEV1/FVC ratio: 71 %
Post FEV6/FVC ratio: 98 %
Pre FEV1/FVC ratio: 69 %
Pre FEV6/FVC Ratio: 98 %
RV % pred: 93 %
RV: 2.28 L
TLC % pred: 76 %
TLC: 5.5 L

## 2022-11-06 MED ORDER — ALBUTEROL SULFATE (2.5 MG/3ML) 0.083% IN NEBU
2.5000 mg | INHALATION_SOLUTION | Freq: Once | RESPIRATORY_TRACT | Status: AC
Start: 2022-11-06 — End: 2022-11-06
  Administered 2022-11-06: 2.5 mg via RESPIRATORY_TRACT

## 2022-11-06 NOTE — Progress Notes (Signed)
Spoke to pt and he stated that he was unaware of his scheduled cardioversion on 11/07/22. He also stated that he would not have a ride to/from the hospital and did not have anyone to stay with him overnight. I stated that I would let our scheduling team know and have them reach back out to him.

## 2022-11-06 NOTE — Pre-Procedure Instructions (Signed)
Spoke with Shawn Perkins.  Asked if he could check with his daughter to help with supervision after cardioversion.  He said he will have to talk to her  and get back with [place].   Office aware to follow up with patient in a few days.

## 2022-11-07 ENCOUNTER — Telehealth (HOSPITAL_COMMUNITY): Payer: Self-pay | Admitting: Family Medicine

## 2022-11-07 ENCOUNTER — Ambulatory Visit (HOSPITAL_COMMUNITY): Admission: RE | Admit: 2022-11-07 | Payer: Medicare Other | Source: Home / Self Care | Admitting: Cardiology

## 2022-11-07 ENCOUNTER — Encounter (HOSPITAL_COMMUNITY): Admission: RE | Payer: Self-pay | Source: Home / Self Care

## 2022-11-07 ENCOUNTER — Telehealth: Payer: Self-pay

## 2022-11-07 SURGERY — CARDIOVERSION
Anesthesia: General

## 2022-11-07 NOTE — Telephone Encounter (Signed)
Rec'd patients oop expense here at [facility]. Faxing over to you

## 2022-11-07 NOTE — Telephone Encounter (Signed)
Notified that patient was unaware of his up-coming DCCV and did not have transportation. Called patient to discuss. He was unaware of cardioversion and stated no one discussed this with him. I reviewed details of procedure and indication for procedure at his visit with me on [id number]. I again explained above. He does not have transportation and relies on his daughter who lives out of town. We will cancel DCCV scheduled for 11/07/22, and attempt to re-schedule soon. Schedulers will need to plan to call Zell's daughter to help facilitate transportation.   Prince Rome, FNP-BC 11/06/22

## 2022-11-07 NOTE — Telephone Encounter (Signed)
Advanced Heart Failure Patient Advocate Encounter  Sent OOP to [facility] via efax.  Will follow up.

## 2022-11-07 NOTE — Telephone Encounter (Signed)
Patient calls nurse line requesting MRI results.   I advised the results are not back yet and we will call once we receive report.   Patient appreciative.

## 2022-11-07 NOTE — Telephone Encounter (Signed)
Pt returned call to request DCCV procedure rescheduled. Requests fri 9/20    Attempted to verify secondary date with daughter LMOM (Returned call 9/19,9/20 or 10/4)     PT CONFIRMED HE IS NOT TAKING FARXIGA (DAPAGLIFLOZIN). NEVER RECEIVED MED--MEDICATION BOTTLES REVIEWED WITH PATIENT AT THE TIME OF CALL.  PT AWARE OF INSTRUCTIONS BELOW      Dear Shawn Perkins  You are scheduled for a Cardioversion on Thursday, September 19 with Dr. Shirlee Latch.  Please arrive at the [facility] (Main Entrance A) at [facility]: [address] [place], [place] [address] at 12:30 PM (This time is 2 hour(s) before your procedure to ensure your preparation). Free valet parking service is available. You will check in at ADMITTING. The support person will be asked to wait in the waiting room.  It is OK to have someone drop you off and come back when you are ready to be discharged.     Nothing to eat or drink after midnight except a sip of water with medications (see medication instructions below)  MEDICATION INSTRUCTIONS: !!IF ANY NEW MEDICATIONS ARE STARTED AFTER TODAY, PLEASE NOTIFY YOUR PROVIDER AS SOON AS POSSIBLE!!  FYI: Medications such as Semaglutide (Ozempic, [place]), Tirzepatide (Mounjaro, Zepbound), Dulaglutide (Trulicity), etc ("GLP1 agonists") AND Canagliflozin (Invokana), Dapagliflozin (Farxiga), Empagliflozin (Jardiance), Ertugliflozin (Steglatro), Bexagliflozin [employer]) or any combination with one of these drugs such as Invokamet (Canagliflozin/Metformin), Synjardy (Empagliflozin/Metformin), etc ("SGLT2 inhibitors") must be held around the time of a procedure. This is not a comprehensive list of all of these drugs. Please review all of your medications and talk to your provider if you take any one of these. If you are not sure, ask your provider.   HOLD: Dapagliflozin Marcelline Deist) for 3 days prior to the procedure. Last dose on Tuesday, September 17.         Continue taking your anticoagulant  (blood thinner): Apixaban (Eliquis).  You will need to continue this after your procedure until you are told by your provider that it is safe to stop.    LABS:  Pre procedure labs are not needed, out patient labs recently done  FYI:  For your safety, and to allow [place] to monitor your vital signs accurately during the surgery/procedure we request: If you have artificial nails, gel coating, SNS etc, please have those removed prior to your surgery/procedure. Not having the nail coverings /polish removed may result in cancellation or delay of your surgery/procedure.  You must have a responsible person to drive you home and stay in the waiting area during your procedure. Failure to do so could result in cancellation.  Bring your insurance cards.  *Special Note: Every effort is made to have your procedure done on time. Occasionally there are emergencies that occur at the hospital that may cause delays. Please be patient if a delay does occur.

## 2022-11-07 NOTE — Telephone Encounter (Signed)
Please let patient know Radiology has not yet read MRI. I will call him ASAP once read.  Terisa Starr, MD  Family Medicine Teaching Service

## 2022-11-07 NOTE — Telephone Encounter (Signed)
Patient informed. Penni Bombard CMA

## 2022-11-08 ENCOUNTER — Encounter (HOSPITAL_BASED_OUTPATIENT_CLINIC_OR_DEPARTMENT_OTHER): Payer: Medicare Other | Admitting: Cardiovascular Disease

## 2022-11-08 NOTE — Progress Notes (Signed)
Spoke to pt and instructed them to come at 1200 and to be NPO after 0000. Confirmed no missed doses of AC and instructed to take in AM with a small sip of water.   Confirmed that pt will have a ride home and someone to stay with them for 24 hours after the procedure.

## 2022-11-09 ENCOUNTER — Encounter (HOSPITAL_COMMUNITY): Payer: Self-pay | Admitting: Cardiology

## 2022-11-09 ENCOUNTER — Ambulatory Visit (HOSPITAL_COMMUNITY): Payer: Medicare Other

## 2022-11-09 ENCOUNTER — Ambulatory Visit (HOSPITAL_COMMUNITY)
Admission: RE | Admit: 2022-11-09 | Discharge: 2022-11-09 | Disposition: A | Discharge status: Home / Self Care | Payer: Medicare Other | Attending: Cardiology | Admitting: Cardiology

## 2022-11-09 ENCOUNTER — Encounter (HOSPITAL_COMMUNITY)
Admission: RE | Disposition: A | Discharge status: Home / Self Care | Payer: Self-pay | Source: Home / Self Care | Attending: Cardiology

## 2022-11-09 DIAGNOSIS — I5042 Chronic combined systolic (congestive) and diastolic (congestive) heart failure: Secondary | ICD-10-CM | POA: Insufficient documentation

## 2022-11-09 DIAGNOSIS — I110 Hypertensive heart disease with heart failure: Secondary | ICD-10-CM | POA: Insufficient documentation

## 2022-11-09 DIAGNOSIS — I5022 Chronic systolic (congestive) heart failure: Secondary | ICD-10-CM | POA: Diagnosis not present

## 2022-11-09 DIAGNOSIS — I4819 Other persistent atrial fibrillation: Secondary | ICD-10-CM | POA: Diagnosis not present

## 2022-11-09 DIAGNOSIS — I428 Other cardiomyopathies: Secondary | ICD-10-CM | POA: Diagnosis not present

## 2022-11-09 DIAGNOSIS — I4891 Unspecified atrial fibrillation: Secondary | ICD-10-CM | POA: Diagnosis present

## 2022-11-09 DIAGNOSIS — F17210 Nicotine dependence, cigarettes, uncomplicated: Secondary | ICD-10-CM | POA: Insufficient documentation

## 2022-11-09 DIAGNOSIS — F1090 Alcohol use, unspecified, uncomplicated: Secondary | ICD-10-CM | POA: Insufficient documentation

## 2022-11-09 DIAGNOSIS — Z7901 Long term (current) use of anticoagulants: Secondary | ICD-10-CM | POA: Diagnosis not present

## 2022-11-09 DIAGNOSIS — Z79899 Other long term (current) drug therapy: Secondary | ICD-10-CM | POA: Diagnosis not present

## 2022-11-09 HISTORY — PX: CARDIOVERSION: SHX1299

## 2022-11-09 SURGERY — CARDIOVERSION
Anesthesia: General

## 2022-11-09 MED ORDER — PROPOFOL 10 MG/ML IV BOLUS
INTRAVENOUS | Status: DC | PRN
  Administered 2022-11-09: 30 mg via INTRAVENOUS
  Administered 2022-11-09: 50 mg via INTRAVENOUS

## 2022-11-09 MED ORDER — LIDOCAINE 2% (20 MG/ML) 5 ML SYRINGE
INTRAMUSCULAR | Status: DC | PRN
  Administered 2022-11-09: 100 mg via INTRAVENOUS

## 2022-11-09 MED ORDER — SODIUM CHLORIDE 0.9 % IV SOLN
INTRAVENOUS | Status: DC
  Administered 2022-11-09: 20 mL/h via INTRAVENOUS

## 2022-11-09 SURGICAL SUPPLY — 1 items: ELECT DEFIB PAD ADLT CADENCE (PAD) ×1 IMPLANT

## 2022-11-09 NOTE — Interval H&P Note (Signed)
History and Physical Interval Note:  11/09/2022 1:47 PM  Shawn Perkins  has presented today for surgery, with the diagnosis of AFIB.  The various methods of treatment have been discussed with the patient and family. After consideration of risks, benefits and other options for treatment, the patient has consented to  Procedure(s): CARDIOVERSION (N/A) as a surgical intervention.  The patient's history has been reviewed, patient examined, no change in status, stable for surgery.  I have reviewed the patient's chart and labs.  Questions were answered to the patient's satisfaction.     Nakiah Osgood [employer]

## 2022-11-09 NOTE — Transfer of Care (Signed)
Immediate Anesthesia Transfer of Care Note  Patient: TOVIA PEDEN  Procedure(s) Performed: CARDIOVERSION  Patient Location: Cath Lab  Anesthesia Type:General  Level of Consciousness: sedated and drowsy  Airway & Oxygen Therapy: Patient Spontanous Breathing and Patient connected to nasal cannula oxygen  Post-op Assessment: Report given to RN and Post -op Vital signs reviewed and stable  Post vital signs: Reviewed and stable  Last Vitals:  Vitals Value Taken Time  BP 141/105   Temp 97.,5   Pulse 102 11/09/22 1400  Resp 26 11/09/22 1400  SpO2 90 % 11/09/22 1400  Vitals shown include unfiled device data.  Last Pain:  Vitals:   11/09/22 1201  TempSrc: Temporal         Complications: No notable events documented.

## 2022-11-09 NOTE — Anesthesia Preprocedure Evaluation (Addendum)
Anesthesia Evaluation  Patient identified by MRN, date of birth, ID band Patient awake    Reviewed: Allergy & Precautions, H&P , NPO status , Patient's Chart, lab work & pertinent test results  Airway Mallampati: II  TM Distance: >3 FB Neck ROM: Full    Dental no notable dental hx. (+) Poor Dentition,    Pulmonary COPD, Current Smoker and Patient abstained from smoking.   Pulmonary exam normal breath sounds clear to auscultation       Cardiovascular hypertension, +CHF  Normal cardiovascular exam+ dysrhythmias Atrial Fibrillation  Rhythm:Regular Rate:Normal  IMPRESSIONS     1. Left ventricular ejection fraction, by estimation, is 45%. The left  ventricle has mildly decreased function. The left ventricle demonstrates  global hypokinesis. The left ventricular internal cavity size was mildly  dilated. Left ventricular diastolic  parameters are indeterminate.   2. Right ventricular systolic function is normal. The right ventricular  size is normal. There is normal pulmonary artery systolic pressure.   3. Left atrial size was severely dilated.   4. Right atrial size was moderately dilated.   5. The mitral valve is abnormal. Moderate to severe mitral valve  regurgitation. No evidence of mitral stenosis.   6. Tricuspid valve regurgitation is moderate to severe.   7. The aortic valve is tricuspid. There is mild calcification of the  aortic valve. There is mild thickening of the aortic valve. Aortic valve  regurgitation is mild to moderate. Aortic valve sclerosis is present, with  no evidence of aortic valve  stenosis.   8. Aortic dilatation noted. There is mild dilatation of the aortic root,  measuring 41 mm. There is mild dilatation of the ascending aorta,  measuring 41 mm.   9. The inferior vena cava is normal in size with greater than 50%  respiratory variability, suggesting right atrial pressure of 3 mmHg.   Comparison(s):  Prior images reviewed side by side. Significant changes  have occured.      Neuro/Psych negative neurological ROS  negative psych ROS   GI/Hepatic negative GI ROS, Neg liver ROS,,,Hx of alcohol abuse   Endo/Other  negative endocrine ROS    Renal/GU negative Renal ROS  negative genitourinary   Musculoskeletal negative musculoskeletal ROS (+)    Abdominal   Peds negative pediatric ROS (+)  Hematology negative hematology ROS (+)   Anesthesia Other Findings   Reproductive/Obstetrics negative OB ROS                             Anesthesia Physical Anesthesia Plan  ASA: 3  Anesthesia Plan: General   Post-op Pain Management:    Induction: Intravenous  PONV Risk Score and Plan: Ondansetron and Dexamethasone  Airway Management Planned: Natural Airway  Additional Equipment:   Intra-op Plan:   Post-operative Plan: Extubation in OR  Informed Consent: I have reviewed the patients History and Physical, chart, labs and discussed the procedure including the risks, benefits and alternatives for the proposed anesthesia with the patient or authorized representative who has indicated his/her understanding and acceptance.     Dental advisory given  Plan Discussed with: CRNA  Anesthesia Plan Comments:         Anesthesia Quick Evaluation

## 2022-11-09 NOTE — Procedures (Signed)
Electrical Cardioversion Procedure Note Shawn Perkins [id number] [birth year]  Procedure: Electrical Cardioversion Indications:  Atrial Fibrillation  Procedure Details Consent: Risks of procedure as well as the alternatives and risks of each were explained to the (patient/caregiver).  Consent for procedure obtained. Time Out: Verified patient identification, verified procedure, site/side was marked, verified correct patient position, special equipment/implants available, medications/allergies/relevent history reviewed, required imaging and test results available.  Performed  Patient placed on cardiac monitor, pulse oximetry, supplemental oxygen as necessary.  Sedation given:  Propofol Pacer pads placed anterior and posterior chest.  Cardioverted  4  times with sternal pressure  Cardioverted at 200J.  Evaluation Findings: Post procedure EKG shows: Atrial Fibrillation Complications: None Patient did tolerate procedure well.   Shawn Perkins 11/09/2022, 1:59 PM

## 2022-11-10 ENCOUNTER — Telehealth (HOSPITAL_COMMUNITY): Payer: Self-pay | Admitting: Family Medicine

## 2022-11-10 ENCOUNTER — Telehealth: Payer: Self-pay | Admitting: Family Medicine

## 2022-11-10 DIAGNOSIS — M545 Low back pain, unspecified: Secondary | ICD-10-CM

## 2022-11-10 NOTE — Telephone Encounter (Signed)
Patient returned call to confirm appt 9/23

## 2022-11-10 NOTE — H&P (View-Only) (Signed)
[facility]   Patient ID: Shawn Perkins, male   DOB: [birth year], 67 y.o.   MRN: [id number]  PCP: Shawn Chandler, MD HF Cardiology: Dr. Shirlee Latch  HPI: Shawn Perkins is a 67 y.o. African-American male with a h/o severe HTN, tobacco and ETOH use, and chronic systolic HF/NICM.   Cath (11/11/10) which showed normal cors. EF 20%, RA 7, RV 51/4, PA 61/30, with a mean of 47, PCWP 30, CO 4.0. He was diuresed aggressively and started on GDMT. He was discharged home, weight was 219 lbs.  Admitted 10/14 with a/c systolic heart failure in setting of medication noncompliance x several months, and severe HTN. HF medications restarted. Echo showed EF back down to 35-40% from 55%.  He was discharged home, weight 214 lbs.  Last seen 04/2020. Stable NYHA II and volume stable. Plan to repeat echo in 1 year.  Saw PCP 09/08/22 with SOB x 1 month. Found to be in new AF, rate controlled, and volume overloaded. Advised evaluation in ED, but patient declined. Started on Eliquis. BNP 600, and Lasix increased to bid. Echo 7/24 showed EF 45%, RV normal, moderate to severe MR and TR  Follow up 8/24, last seen 2022. Remained in AF, started amiodarone and titrated GDMT.  Underwent DCCV 11/09/22 x 3, unsuccessful.  Today he returns for post DCCV follow up. Overall feeling fair, feels worse since DCCV, he is now SOB walking up steps. 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 Takes Lasix a couple times a week. Denies palpitations, abnormal bleeding, CP, dizziness, edema, or PND/Orthopnea. Appetite ok. No fever or chills. Weight at home 238 pounds. Taking all medications. Smokes 2 cigs/day, no ETOH use.   ECG (personally reviewed): atrial fibrillation 68 bpm  Labs (5/20): K 4.1, creatinine 1.11, LDL 93  Labs (7/24): K 3.6, creatinine 1.14, TSH normal Labs (9/24): K 4.1, creatinine 1.15  Cardiac Studies - R/LHC (9/12): normal cors, RA 7, RV 51/4, PA 61/30, with a mean of 47, PCWP 30, CO 4.0. - Echo  (12/12): EF 55-60% - Echo (10/14): EF 35-40%  - Echo (4/15): EF 50-55%  - Echo (5/18): EF 55-60%, moderate LVH, normal RV size and systolic function. - Echo (2020): EF 55-60%  - Echo (7/24, in Afib): EF 45%, normal RV, moderate to severe MR, moderate to severe TR - DCCV (9/24) x 3, unsuccessful.   ABIs (3/19): Normal.   ROS: All systems negative except as listed in HPI, PMH and Problem List.  Past Medical History:  Diagnosis Date   CHF (congestive heart failure) (HCC)    Diverticulosis    Heart murmur    History of cocaine abuse (HCC)    use to smoke crack cocaine, last use was more than 6 years ago   Hypertension    Rheumatic fever    as a child   Systolic heart failure    a. 11/09/10 echo EF 35%, b. 11/11/10 cath norm cors c. ECHO (11/2012) EF 35-50%   Umbilical hernia, incarcerated 11/2012   11/2012 exploratory lap with small bwl resection   Current Outpatient Medications  Medication Sig Dispense Refill   acetaminophen (TYLENOL) 500 MG tablet Take 500-1,000 mg by mouth every 6 (six) hours as needed (BACK PAIN.).     amiodarone (PACERONE) 200 MG tablet Take 1 tablet (200 mg total) by mouth daily. 90 tablet 3   apixaban (ELIQUIS) 5 MG TABS tablet Take 1 tablet (5 mg total) by mouth 2 (two) times daily.  aspirin EC 81 MG tablet Take 1 tablet (81 mg total) by mouth daily. 90 tablet 3   atorvastatin (LIPITOR) 40 MG tablet Take 1 tablet (40 mg total) by mouth daily. 90 tablet 3   carvedilol (COREG) 25 MG tablet Take 1 tablet (25 mg total) by mouth 2 (two) times daily. (Patient taking differently: Take 25 mg by mouth in the morning.) 180 tablet 3   furosemide (LASIX) 40 MG tablet Take 1 tablet (40 mg total) by mouth daily as needed (FOR SWELLING OR WEIGHT GAIN 3 POUNDS OVER NIGHT OR 5 POUNDS IN 1 WEEK). 90 tablet 1   potassium chloride SA (KLOR-CON M) 20 MEQ tablet Take 2 tablets (40 mEq total) by mouth daily as needed (TAKE (2) TABLETS ON DAYS THAT YOU TAKE LASIX  (FUROSEMIDE)). 60 tablet 3   vitamin B-12 (CYANOCOBALAMIN) 1000 MCG tablet Take 1 tablet (1,000 mcg total) by mouth daily. 90 tablet 3   dapagliflozin propanediol (FARXIGA) 10 MG TABS tablet Take 1 tablet (10 mg total) by mouth daily before breakfast. (Patient not taking: Reported on 11/13/2022) 30 tablet 5   Current Facility-Administered Medications  Medication Dose Route Frequency Provider Last Rate Last Admin   0.9 %  sodium chloride infusion  500 mL Intravenous Continuous [name], [name], MD       BP 102/88   Pulse 69   Wt 108.3 kg (238 lb 12.8 oz)   SpO2 97%   BMI 33.31 kg/m   Wt Readings from Last 3 Encounters:  11/13/22 108.3 kg (238 lb 12.8 oz)  11/09/22 107 kg (236 lb)  10/18/22 106.3 kg (234 lb 6.4 oz)   PHYSICAL EXAM: General:  NAD. No resp difficulty, walked into clinic HEENT: Normal Neck: Supple. No JVD. Carotids 2+ bilat; no bruits. No lymphadenopathy or thryomegaly appreciated. Cor: PMI nondisplaced. irregular rate & rhythm. No rubs, gallops or murmurs. Lungs: Clear Abdomen: Soft, nontender, nondistended. No hepatosplenomegaly. No bruits or masses. Good bowel sounds. Extremities: No cyanosis, clubbing, rash, edema Neuro: Alert & oriented x 3, cranial nerves grossly intact. Moves all 4 extremities w/o difficulty. Affect pleasant.  ASSESSMENT & PLAN: 1. Chronic Diastolic Heart Failure: Nonischemic cardiomyopathy, possibly related to HTN. Echo (2022) EF 55-60%. New AFib 7/24, echo (7/24) showed EF 45%, normal RV, moderate to severe TR/MR. Suspect drop in EF tachy-mediated. No chest pain or obvious ACS, defer LHC for now. NYHA II-IIb, he is not volume overloaded today. GDMT limited by recent hyperkalemia. - Start Farxiga 10 mg daily. BMET and BNP today. - Keep Lasix 40 mg PRN + 20 KCL PRN - Continue Coreg 25 mg bid. - Off Entresto and spiro with elevated K. No BP room to add today. Plan to re-challenge down the road.  2. Atrial fibrillation: Newly diagnosed 7/24.  Rate controlled. TSH normal. S/p unsuccessful DCCV 9/24 x 3. Remains in AFib on ECG today, rate controlled. - Increase amiodarone to 400 mg daily. He will need regular eye exam while on amiodarone. Follow TFTs and LFTs. - Continue Eliquis 5 mg bid. He has not missed any doses. Pharmacy helping for patient assistance/samples - Sleep study has been arranged.  - After amio load, arrange for repeat DCCV (with EP pads). Discussed with patient and he is agreeable. Discussed with Dr. Shirlee Latch. Will need to coordinate with patient's daughter's schedule to arrange this.  Informed Consent   Shared Decision Making/Informed Consent The risks (stroke, cardiac arrhythmias rarely resulting in the need for a temporary or permanent pacemaker, skin irritation or  burns and complications associated with conscious sedation including aspiration, arrhythmia, respiratory failure and death), benefits (restoration of normal sinus rhythm) and alternatives of a direct current cardioversion were explained in detail to Shawn Perkins and he agrees to proceed.       3. HTN: Stable.  - GDMT as above. 4. Tobacco use: Discussed cessation. He is cutting down  5. ETOH: Quit x 2-3 months - Congratulated.  Arrange repeat DCCV with Dr Shirlee Latch in 2-3 weeks. Follow up with APP or PharmD after to add back GDMT. Keep follow up in 2 months with Dr. Shirlee Latch, as scheduled.  Anderson Malta [facility] FNP-BC  11/13/2022

## 2022-11-10 NOTE — Telephone Encounter (Signed)
Called with MRI results. Denies bowel/bladder symptoms. Mostly central back pain with some radiation.   Referral to Spine Surgery placed. All questions answered.  Jessica--can you please send along referral?  Terisa Starr, MD  [facility] Medicine Teaching Service

## 2022-11-10 NOTE — Anesthesia Postprocedure Evaluation (Signed)
Anesthesia Post Note  Patient: Shawn Perkins  Procedure(s) Performed: CARDIOVERSION     Patient location during evaluation: PACU Anesthesia Type: General Level of consciousness: awake and alert Pain management: pain level controlled Vital Signs Assessment: post-procedure vital signs reviewed and stable Respiratory status: spontaneous breathing, nonlabored ventilation, respiratory function stable and patient connected to nasal cannula oxygen Cardiovascular status: blood pressure returned to baseline and stable Postop Assessment: no apparent nausea or vomiting Anesthetic complications: no  No notable events documented.  Last Vitals:  Vitals:   11/09/22 1415 11/09/22 1420  BP: (!) 131/91 135/88  Pulse: 82 88  Resp: 20 20  Temp:    SpO2: 98% 91%    Last Pain:  Vitals:   11/09/22 1420  TempSrc:   PainSc: 0-No pain                 Debra Calabretta L [name]

## 2022-11-10 NOTE — Progress Notes (Signed)
[facility]   Patient ID: Shawn Perkins, male   DOB: 1955/04/12, 67 y.o.   MRN: [id number]  PCP: Shawn Chandler, MD HF Cardiology: Dr. Shirlee Perkins  HPI: Shawn Perkins is a 67 y.o. African-American male with a h/o severe HTN, tobacco and ETOH use, and chronic systolic HF/NICM.   Cath (11/11/10) which showed normal cors. EF 20%, RA 7, RV 51/4, PA 61/30, with a mean of 47, PCWP 30, CO 4.0. He was diuresed aggressively and started on GDMT. He was discharged home, weight was 219 lbs.  Admitted 10/14 with a/c systolic heart failure in setting of medication noncompliance x several months, and severe HTN. HF medications restarted. Echo showed EF back down to 35-40% from 55%.  He was discharged home, weight 214 lbs.  Last seen 04/2020. Stable NYHA II and volume stable. Plan to repeat echo in 1 year.  Saw PCP 09/08/22 with SOB x 1 month. Found to be in new AF, rate controlled, and volume overloaded. Advised evaluation in ED, but patient declined. Started on Eliquis. BNP 600, and Lasix increased to bid. Echo 7/24 showed EF 45%, RV normal, moderate to severe MR and TR  Today he returns for HF follow up. We have not seen him since 2022. Overall feeling ok. He is not SOB walking on flat ground, has SOB walking up steps. Legs are tired, has occasional positional dizziness. Denies palpitations, CP, abnormal bleeding, edema, or PND/Orthopnea. Appetite ok. No fever or chills. Weight at home 239 pounds. Taking all medications. Smokes 2 cigs/day, stopped drinking ETOH for past 3-4 weeks. Works in Location manager. He is unsure if he snores.  ECG (personally reviewed): atrial fibrillation 96 bpm  ReDs: 34%  Labs (5/20): K 4.1, creatinine 1.11, LDL 93  Labs (7/24): K 3.6, creatinine 1.14, TSH normal  Cardiac Studies - R/LHC (9/12): normal cors, RA 7, RV 51/4, PA 61/30, with a mean of 47, PCWP 30, CO 4.0. - Echo (12/12): EF 55-60% - Echo (10/14): EF 35-40%  - Echo (4/15): EF 50-55%  - Echo  (5/18): EF 55-60%, moderate LVH, normal RV size and systolic function. - Echo (2020): EF 55-60%  - Echo (7/24, in Afib): EF 45%, normal RV, moderate to severe MR, moderate to severe TR  ABIs (3/19): Normal.   ROS: All systems negative except as listed in HPI, PMH and Problem List.  Past Medical History:  Diagnosis Date   CHF (congestive heart failure) (HCC)    Diverticulosis    Heart murmur    History of cocaine abuse (HCC)    use to smoke crack cocaine, last use was more than 6 years ago   Hypertension    Rheumatic fever    as a child   Systolic heart failure    a. 11/09/10 echo EF 35%, b. 11/11/10 cath norm cors c. ECHO (11/2012) EF 35-50%   Umbilical hernia, incarcerated 11/2012   11/2012 exploratory lap with small bwl resection   Current Outpatient Medications  Medication Sig Dispense Refill   acetaminophen (TYLENOL) 500 MG tablet Take 500-1,000 mg by mouth every 6 (six) hours as needed (BACK PAIN.).     amiodarone (PACERONE) 200 MG tablet Take 1 tablet (200 mg total) by mouth daily. 90 tablet 3   apixaban (ELIQUIS) 5 MG TABS tablet Take 1 tablet (5 mg total) by mouth 2 (two) times daily.     aspirin EC 81 MG tablet Take 1 tablet (81 mg total) by mouth daily. 90 tablet 3  atorvastatin (LIPITOR) 40 MG tablet Take 1 tablet (40 mg total) by mouth daily. 90 tablet 3   carvedilol (COREG) 25 MG tablet Take 1 tablet (25 mg total) by mouth 2 (two) times daily. (Patient taking differently: Take 25 mg by mouth in the morning.) 180 tablet 3   dapagliflozin propanediol (FARXIGA) 10 MG TABS tablet Take 1 tablet (10 mg total) by mouth daily before breakfast. 30 tablet 5   furosemide (LASIX) 40 MG tablet Take 1 tablet (40 mg total) by mouth daily as needed (FOR SWELLING OR WEIGHT GAIN 3 POUNDS OVER NIGHT OR 5 POUNDS IN 1 WEEK). 90 tablet 1   potassium chloride SA (KLOR-CON M) 20 MEQ tablet Take 2 tablets (40 mEq total) by mouth daily as needed (TAKE (2) TABLETS ON DAYS THAT YOU TAKE LASIX  (FUROSEMIDE)). 60 tablet 3   sodium zirconium cyclosilicate (LOKELMA) 10 g PACK packet Take 10 g by mouth as directed. (Patient not taking: Reported on 10/27/2022) 30 packet 0   vitamin B-12 (CYANOCOBALAMIN) 1000 MCG tablet Take 1 tablet (1,000 mcg total) by mouth daily. 90 tablet 3   Current Facility-Administered Medications  Medication Dose Route Frequency Provider Last Rate Last Admin   0.9 %  sodium chloride infusion  500 mL Intravenous Continuous [name], [name], MD       There were no vitals taken for this visit.  Wt Readings from Last 3 Encounters:  11/09/22 107 kg (236 lb)  10/18/22 106.3 kg (234 lb 6.4 oz)  10/05/22 109.4 kg (241 lb 1.6 oz)   PHYSICAL EXAM: General:  NAD. No resp difficulty, walked into clinic HEENT: Normal Neck: Supple. No JVD. Carotids 2+ bilat; no bruits. No lymphadenopathy or thryomegaly appreciated. Cor: PMI nondisplaced. Regular rate & rhythm. No rubs, gallops or murmurs. Lungs: Clear, diminished in bases Abdomen: Soft, obese, nontender, nondistended. No hepatosplenomegaly. No bruits or masses. Good bowel sounds. + umbilical hernia Extremities: No cyanosis, clubbing, rash, edema; + BLE varicosities  Neuro: Alert & oriented x 3, cranial nerves grossly intact. Moves all 4 extremities w/o difficulty. Affect pleasant.  ASSESSMENT & PLAN: 1. Chronic Diastolic Heart Failure: Nonischemic cardiomyopathy, possibly related to HTN. Echo (2022) EF 55-60%. New AFib 7/24, echo (7/24) showed EF 45%, normal RV, moderate to severe TR/MR. Suspect drop in EF tachy-mediated. No chest pain or obvious ACS, defer LHC for now. NYHA II-IIb, he is not volume overloaded today, ReDs 34% - Stop losartan. - Start Entresto 24/26 mg bid. BMET/BNP today. - Continue Lasix 40 mg daily. - Continue spiro 25 mg daily.  - Continue Coreg 25 mg bid.  - Plan to add SGLT2i next visit and decrease Lasix to PRN. Check A1C today. 2. Atrial fibrillation: Newly diagnosed 7/24. Rate controlled. TSH  normal. Remains in AFib on ECG today, rate controlled. - Start amiodarone 200 mg bid x 2 weeks, then 200 mg daily. Plan for DCCV, if does not convert chemically. - Will obtain baseline PFTs w/ DLCO, and CXR with initiation of amiodarone therapy. - Continue Eliquis 5 mg bid. He has not missed any doses. Rx >$550, engage pharmacy for patient assistance/samples - Arrange sleep study. 3. HTN: Stable.  - GDMT as above. 4. Tobacco use: Discussed cessation. He is cutting down  5. ETOH: Quit x 3-4 weeks. - Congratulated.  Follow up in 3 weeks with APP. If remains in AF, will arrange DCCV (he will need ECG and BMET at this visit).  Anderson Malta [facility] FNP-BC  11/10/2022

## 2022-11-13 ENCOUNTER — Encounter (HOSPITAL_COMMUNITY): Payer: Self-pay

## 2022-11-13 ENCOUNTER — Telehealth (HOSPITAL_COMMUNITY): Payer: Self-pay

## 2022-11-13 ENCOUNTER — Ambulatory Visit (HOSPITAL_COMMUNITY)
Admission: RE | Admit: 2022-11-13 | Discharge: 2022-11-13 | Disposition: A | Discharge status: Home / Self Care | Payer: Medicare Other | Source: Ambulatory Visit | Attending: Family Medicine | Admitting: Family Medicine

## 2022-11-13 VITALS — BP 102/88 | HR 69 | Wt 238.8 lb

## 2022-11-13 DIAGNOSIS — I428 Other cardiomyopathies: Secondary | ICD-10-CM | POA: Insufficient documentation

## 2022-11-13 DIAGNOSIS — Z720 Tobacco use: Secondary | ICD-10-CM | POA: Diagnosis not present

## 2022-11-13 DIAGNOSIS — F17210 Nicotine dependence, cigarettes, uncomplicated: Secondary | ICD-10-CM | POA: Insufficient documentation

## 2022-11-13 DIAGNOSIS — I4891 Unspecified atrial fibrillation: Secondary | ICD-10-CM | POA: Insufficient documentation

## 2022-11-13 DIAGNOSIS — I110 Hypertensive heart disease with heart failure: Secondary | ICD-10-CM | POA: Insufficient documentation

## 2022-11-13 DIAGNOSIS — I10 Essential (primary) hypertension: Secondary | ICD-10-CM | POA: Diagnosis not present

## 2022-11-13 DIAGNOSIS — Z7901 Long term (current) use of anticoagulants: Secondary | ICD-10-CM | POA: Insufficient documentation

## 2022-11-13 DIAGNOSIS — I5032 Chronic diastolic (congestive) heart failure: Secondary | ICD-10-CM | POA: Diagnosis present

## 2022-11-13 DIAGNOSIS — R9431 Abnormal electrocardiogram [ECG] [EKG]: Secondary | ICD-10-CM | POA: Diagnosis not present

## 2022-11-13 DIAGNOSIS — R17 Unspecified jaundice: Secondary | ICD-10-CM | POA: Diagnosis present

## 2022-11-13 DIAGNOSIS — F1011 Alcohol abuse, in remission: Secondary | ICD-10-CM

## 2022-11-13 LAB — BASIC METABOLIC PANEL
Anion gap: 8 (ref 5–15)
BUN: 10 mg/dL (ref 8–23)
CO2: 21 mmol/L — ABNORMAL LOW (ref 22–32)
Calcium: 8.8 mg/dL — ABNORMAL LOW (ref 8.9–10.3)
Chloride: 109 mmol/L (ref 98–111)
Creatinine, Ser: 1.13 mg/dL (ref 0.61–1.24)
GFR, Estimated: >60 mL/min (ref >60)
Glucose, Bld: 85 mg/dL (ref 70–99)
Potassium: 4.1 mmol/L (ref 3.5–5.1)
Sodium: 138 mmol/L (ref 135–145)

## 2022-11-13 LAB — BRAIN NATRIURETIC PEPTIDE: B Natriuretic Peptide: 561 pg/mL — ABNORMAL HIGH (ref 0.0–100.0)

## 2022-11-13 MED ORDER — DAPAGLIFLOZIN PROPANEDIOL 10 MG PO TABS: 10.0000 mg | ORAL_TABLET | Freq: Every day | ORAL | 5 refills | Status: DC

## 2022-11-13 MED ORDER — POTASSIUM CHLORIDE CRYS ER 20 MEQ PO TBCR: 20.0000 meq | EXTENDED_RELEASE_TABLET | Freq: Every day | ORAL | 3 refills | Status: DC | PRN

## 2022-11-13 MED ORDER — AMIODARONE HCL 200 MG PO TABS: 400.0000 mg | ORAL_TABLET | Freq: Every day | ORAL | 3 refills | Status: DC

## 2022-11-13 NOTE — Patient Instructions (Addendum)
Medication Changes:  INCREASE AMIODARONE TO 400MG  ONCE DAILY   RESTART: FARXIGA 10MG  ONCE DAILY   TAKE LASIX (FUROSEMIDE) 40MG  DAILY ONLY AS NEEDED   WHEN YOU TAKE YOUR LASIX (FUROSEMIDE) PLEASE TAKE YOUR POTASSIUM (1) TABLET  SAMPLES OF ELIQUIS 5MG  GIVEN TODAY- 4 BOXES LOT NUMBER [id number]  EXP 10/25  STAY OFF OF SPIRONOLACTONE AND ENTRESTO- DO NOT TAKE THESE  Lab Work:  Labs done today, your results will be available in MyChart, we will contact you for abnormal readings.  Testing/Procedures:  Your physician has recommended that you have a Cardioversion (DCCV). Electrical Cardioversion uses a jolt of electricity to your heart either through paddles or wired patches attached to your chest. This is a controlled, usually prescheduled, procedure. Defibrillation is done under light anesthesia in the hospital, and you usually go home the day of the procedure. This is done to get your heart back into a normal rhythm. You are not awake for the procedure. Please see the instruction sheet given to you today.  Special Instructions // Education:  WE WILL CALL YOUR DAUGHTER TO SCHEDULED YOUR CARDIOVERSION- WE CALLED TODAY AND LEFT HER A MESSAGE TO CALL us BACK TO SCHEDULE  DIET:  Nothing to eat or drink after midnight except a sip of water with medications (see medication instructions below)  MEDICATION INSTRUCTIONS: !!IF ANY NEW MEDICATIONS ARE STARTED AFTER TODAY, PLEASE NOTIFY YOUR PROVIDER AS SOON AS POSSIBLE!!  FYI: Medications such as Semaglutide (Ozempic, [place]), Tirzepatide (Mounjaro, Zepbound), Dulaglutide (Trulicity), etc ("GLP1 agonists") AND Canagliflozin (Invokana), Dapagliflozin (Farxiga), Empagliflozin (Jardiance), Ertugliflozin (Steglatro), Bexagliflozin [employer]) or any combination with one of these drugs such as Invokamet (Canagliflozin/Metformin), Synjardy (Empagliflozin/Metformin), etc ("SGLT2 inhibitors") must be held around the time of a procedure. This is not a  comprehensive list of all of these drugs. Please review all of your medications and talk to your provider if you take any one of these. If you are not sure, ask your provider.          :1}HOLD: Dapagliflozin Marcelline Deist) for 3 days prior to the procedure. -DO NOT TAKE THIS 3 DAYS PRIOR TO PROCEDURE  Continue taking your anticoagulant (blood thinner): Apixaban (Eliquis).  You will need to continue this after your procedure until you are told by your provider that it is safe to stop.    LABS: TODAY   FYI:  For your safety, and to allow [place] to monitor your vital signs accurately during the surgery/procedure we request: If you have artificial nails, gel coating, SNS etc, please have those removed prior to your surgery/procedure. Not having the nail coverings /polish removed may result in cancellation or delay of your surgery/procedure.  You must have a responsible person to drive you home and stay in the waiting area during your procedure. Failure to do so could result in cancellation.  Bring your insurance cards.  *Special Note: Every effort is made to have your procedure done on time. Occasionally there are emergencies that occur at the hospital that may cause delays. Please be patient if a delay does occur.   Follow-Up in: 5-6 WEEKS AS SCHEDULED WITH APP PLEASE BRING ALL MEDICATIONS TO YOUR NEXT APPOINTMENT  At the [facility], you and your health needs are our priority. We have a designated team specialized in the treatment of Heart Failure. This Care Team includes your primary Heart Failure Specialized Cardiologist (physician), Advanced Practice Providers (APPs- Physician Assistants and Nurse Practitioners), and Pharmacist who all work together to provide you with the care  you need, when you need it.   You may see any of the following providers on your designated Care Team at your next follow up:  Dr. Arvilla Meres Dr. Marca Ancona Dr. Marcos Eke,  NP Robbie Lis, [place] [facility] [place], [place] Brynda Peon, NP Karle Plumber, PharmD   Please be sure to bring in all your medications bottles to every appointment.   Need to Contact us:  If you have any questions or concerns before your next appointment please send [place] a message through [place] or call our office at [phone].    TO LEAVE A MESSAGE FOR THE NURSE SELECT OPTION 2, PLEASE LEAVE A MESSAGE INCLUDING: YOUR NAME DATE OF BIRTH CALL BACK NUMBER REASON FOR CALL**this is important as we prioritize the call backs  YOU WILL RECEIVE A CALL BACK THE SAME DAY AS LONG AS YOU CALL BEFORE 4:00 PM

## 2022-11-13 NOTE — Telephone Encounter (Signed)
Daughter returned call to report to speak wit pt first to set up procedure/

## 2022-11-13 NOTE — Addendum Note (Signed)
Addended by: Bea Laura B on: 11/13/2022 01:55 PM   Modules accepted: Orders

## 2022-11-13 NOTE — Telephone Encounter (Addendum)
Returned call to patient to scheduled cardioversion.   Patient would like to do 11/29/22- at 9am- this is scheduled.   Patient aware that he will need to arrive at [facility] cone at 8am with responsible driver. Patient aware of medication instructions will hold Farxiga x3 days prior to procedure, will hold lasix the morning of procedure. Advised patient on importance of not missing any eliquis doses. Patient advised of NPO prior as well.    Patient is aware of all procedure instructions and verbalized understanding.

## 2022-11-13 NOTE — Telephone Encounter (Signed)
Done

## 2022-11-13 NOTE — Telephone Encounter (Signed)
Attempted to call patients daughter to schedule patients DCCV per Shanda Bumps, NP. Patient will need cardioversion the week of October 7th-11th with Dr. Shirlee Latch. Patients daughter needs to schedule due to patients daughter being the only transportation.   Left message for her to call back to get this scheduled.   Will need to also call patient and go over procedure time, date, and instructions.

## 2022-11-13 NOTE — Telephone Encounter (Signed)
Advanced Heart Failure Patient Advocate Encounter  Called [employer] to check the status of the patient's application. Representative stated they received the OOP and the patient still needs to spend $131.23.

## 2022-11-13 NOTE — Addendum Note (Signed)
Encounter addended by: Baird Cancer, RN on: 11/13/2022 2:51 PM  Actions taken: Order list changed

## 2022-11-14 ENCOUNTER — Ambulatory Visit (HOSPITAL_COMMUNITY)
Admission: RE | Admit: 2022-11-14 | Discharge: 2022-11-14 | Disposition: A | Discharge status: Home / Self Care | Payer: Medicare Other | Source: Ambulatory Visit | Attending: Family Medicine | Admitting: Family Medicine

## 2022-11-14 DIAGNOSIS — I428 Other cardiomyopathies: Secondary | ICD-10-CM | POA: Diagnosis not present

## 2022-11-14 DIAGNOSIS — R17 Unspecified jaundice: Secondary | ICD-10-CM

## 2022-11-14 NOTE — Telephone Encounter (Signed)
Advanced Heart Failure Patient Advocate Encounter  Called and spoke with patient. He will call and request samples if needed while working towards the amount left required from [employer].   Archer Asa, CPhT

## 2022-11-14 NOTE — Telephone Encounter (Signed)
Pt returned call to report 10/9 will not work for him REQUESTS 10/11 as daughter is available  DCCV rescheduled 10/11@ 10am 8am arrival  Instructions are to remain the same

## 2022-11-17 ENCOUNTER — Encounter: Payer: Self-pay | Admitting: Family Medicine

## 2022-11-21 ENCOUNTER — Encounter (HOSPITAL_COMMUNITY): Payer: Medicare Other

## 2022-11-23 NOTE — Progress Notes (Signed)
    SUBJECTIVE:   CHIEF COMPLAINT: check up  HPI:   Shawn Perkins is a 67 y.o.  with history notable for heart failure with reduced EF, atrial fibrillation and tobacco use presenting for follow up.  Tobacco use  The patient h s smoked since age 44. He is now down to 3 cigarettes per day. Interested in cessation options. No productive cough, hemoptysis, weight loss.   Atrial fibrillation Dyspnea is far improved. Taking medications as prescribed. Cost is still a major issue--copay for Eliquis $196. No chest pain, palpitations, orthopnea, PND. He is not taking Spiro/Entresto due to hyperkalemia. No falls or dizziness.  Back He has upcoming injection for spinal stenosis. No new bowel/bladder symptoms.   PERTINENT  PMH / PSH/Family/Social History :  Updated and reviewed  OBJECTIVE:   BP 96/77   Pulse 81   Ht 5\' 11"  (1.803 m)   Wt 242 lb 3.2 oz (109.9 kg)   SpO2 100%   BMI 33.78 kg/m   Today's weight:  Last Weight  Most recent update: 11/27/2022  8:26 AM    Weight  109.9 kg (242 lb 3.2 oz)            Review of prior weights: [employer]   11/27/22 0826  Weight: 242 lb 3.2 oz (109.9 kg)    Irregular rate and rhythm Lungs clear Trace edema, L > R   ASSESSMENT/PLAN:   Assessment & Plan Atrial fibrillation, unspecified type (HCC) Samples for Eliquis given again Has upcoming cardioversion on higher dose of amiodarone Repeat LFT today given elevated bilirubin, thyroid panel, referred to ophthalmology Tobacco abuse Congratulated on cutting down.  Lozenges prescribed.  Quit resources given. Nicotine dependence, cigarettes, with other nicotine-induced disorders Discussed at length benefits risks and incidentals with low-dose CT.  This is scheduled for him today.  He has a 42-pack-year history and continues to smoke. Encounter for immunization Flu and COVID given today Advance care planning Discussed reason for care planning and logistics around creating an advance  directive document.  Will give packet at upcoming visit.  He would like his daughter to be his decision-maker if he were unable to speak for himself. Elevated bilirubin Unclear cause.  This is indirect.  Will obtain hepatitis panel he may need vaccinated against hepatitis B and a.  Will also send LDH and haptoglobin to determine if due to slight hemolysis.  Other differentials include Gilbert disease.  At follow-up discussed shingles vaccine.   Terisa Starr, MD  Family Medicine Teaching Service  [facility]

## 2022-11-27 ENCOUNTER — Other Ambulatory Visit: Payer: Self-pay

## 2022-11-27 ENCOUNTER — Ambulatory Visit (INDEPENDENT_AMBULATORY_CARE_PROVIDER_SITE_OTHER): Payer: Medicare Other | Admitting: Family Medicine

## 2022-11-27 ENCOUNTER — Encounter: Payer: Self-pay | Admitting: Family Medicine

## 2022-11-27 VITALS — BP 96/77 | HR 81 | Ht 71.0 in | Wt 242.2 lb

## 2022-11-27 DIAGNOSIS — F17218 Nicotine dependence, cigarettes, with other nicotine-induced disorders: Secondary | ICD-10-CM

## 2022-11-27 DIAGNOSIS — Z720 Tobacco use: Secondary | ICD-10-CM | POA: Diagnosis not present

## 2022-11-27 DIAGNOSIS — Z23 Encounter for immunization: Secondary | ICD-10-CM

## 2022-11-27 DIAGNOSIS — I5022 Chronic systolic (congestive) heart failure: Secondary | ICD-10-CM

## 2022-11-27 DIAGNOSIS — Z7189 Other specified counseling: Secondary | ICD-10-CM | POA: Diagnosis not present

## 2022-11-27 DIAGNOSIS — R17 Unspecified jaundice: Secondary | ICD-10-CM

## 2022-11-27 DIAGNOSIS — I4891 Unspecified atrial fibrillation: Secondary | ICD-10-CM

## 2022-11-27 DIAGNOSIS — E538 Deficiency of other specified B group vitamins: Secondary | ICD-10-CM

## 2022-11-27 MED ORDER — NICOTINE POLACRILEX 2 MG MT LOZG: 2.0000 mg | LOZENGE | OROMUCOSAL | 0 refills | Status: DC | PRN

## 2022-11-27 MED ORDER — APIXABAN 5 MG PO TABS
5.0000 mg | ORAL_TABLET | Freq: Two times a day (BID) | ORAL | Status: DC
Start: 2022-11-27 — End: 2023-03-02

## 2022-11-27 NOTE — Patient Instructions (Addendum)
It was wonderful to see you today.  Please bring ALL of your medications with you to every visit.   Today we talked about:  I sent in nicotine lozenges  You will be called with the time and date of a lung scan      CONGRATULATIONS ON CUTTING BACK ON SMOKING   If your daughter needs FMLA--fax to [phone]  I have referred you to [facility] to further evaluate your concern. If you do not received a phone call about this appointment within 3-4 weeks, please call our office back at [phone]. Clemencia Course coordinates our referrals and can assist you in this.    I will message you with blood work results  Tobacco use is damaging to your body. It increases your risk of stroke, heart attack, lung cancer, and serious lung disease in the future. It also reduces your fertility.   Quitting tobacco is the best thing for your health but is a challenge---nicotine, a chemical in cigarettes, is highly addictive.   You can call 1 800 QUIT NOW ([phone])---you will be connected with a Careers information officer. They can also mail you nicotine gums, lozenges, and patches to quit.   Ask me about patches (which you wear all day) and gums (which you use when you have a craving) to help you quit.   There are safe, effective medications to help you quit--  Varencline---also called Chantix---- is the most common medication used to help people stop smoking. It starts a low dose and is increased. I recommended choosing a quit date then starting the medication 8 days before this. Side effects include mild headache, difficulty sleeping, and odd dreams. The medication is typically very well tolerated.     Bupropion---also called Zyban---- is started 1 week before your quit date. You take 1 pill for three days then increase to 1 pill twice per day. Side effects include a mild headache and anxiety---this usually goes away. Some patients experience weight loss.      Please follow up in 3 months    Thank you for choosing [facility].   Please call [phone] with any questions about today's appointment.  Please be sure to schedule follow up at the front  desk before you leave today.   Terisa Starr, MD  Family Medicine

## 2022-11-27 NOTE — Assessment & Plan Note (Addendum)
Congratulated on cutting down.  Lozenges prescribed.  Quit resources given.

## 2022-11-27 NOTE — Assessment & Plan Note (Addendum)
Samples for Eliquis given again Has upcoming cardioversion on higher dose of amiodarone Repeat LFT today given elevated bilirubin, thyroid panel, referred to ophthalmology

## 2022-11-27 NOTE — Assessment & Plan Note (Addendum)
Unclear cause.  This is indirect.  Will obtain hepatitis panel he may need vaccinated against hepatitis B and a.  Will also send LDH and haptoglobin to determine if due to slight hemolysis.  Other differentials include Gilbert disease.

## 2022-11-27 NOTE — Assessment & Plan Note (Addendum)
Discussed reason for care planning and logistics around creating an advance directive document.  Will give packet at upcoming visit.  He would like his daughter to be his decision-maker if he were unable to speak for himself.

## 2022-11-28 ENCOUNTER — Telehealth: Payer: Self-pay | Admitting: Family Medicine

## 2022-11-28 LAB — COMPREHENSIVE METABOLIC PANEL
ALT: 27 [IU]/L (ref 0–44)
AST: 22 [IU]/L (ref 0–40)
Albumin: 3.9 g/dL (ref 3.9–4.9)
Alkaline Phosphatase: 164 [IU]/L — ABNORMAL HIGH (ref 44–121)
BUN/Creatinine Ratio: 9 — ABNORMAL LOW (ref 10–24)
BUN: 11 mg/dL (ref 8–27)
Bilirubin Total: 1.3 mg/dL — ABNORMAL HIGH (ref 0.0–1.2)
CO2: 22 mmol/L (ref 20–29)
Calcium: 9.2 mg/dL (ref 8.6–10.2)
Chloride: 108 mmol/L — ABNORMAL HIGH (ref 96–106)
Creatinine, Ser: 1.21 mg/dL (ref 0.76–1.27)
Globulin, Total: 2.7 g/dL (ref 1.5–4.5)
Glucose: 120 mg/dL — ABNORMAL HIGH (ref 70–99)
Potassium: 4.3 mmol/L (ref 3.5–5.2)
Sodium: 142 mmol/L (ref 134–144)
Total Protein: 6.6 g/dL (ref 6.0–8.5)
eGFR: 66 mL/min/{1.73_m2} (ref >59)

## 2022-11-28 LAB — TSH RFX ON ABNORMAL TO FREE T4: TSH: 3.17 u[IU]/mL (ref 0.450–4.500)

## 2022-11-28 LAB — HAPTOGLOBIN: Haptoglobin: 147 mg/dL (ref 32–363)

## 2022-11-28 LAB — HEPATITIS B SURFACE ANTIBODY, QUANTITATIVE: Hepatitis B Surf Ab Quant: <3.5 m[IU]/mL — ABNORMAL LOW

## 2022-11-28 LAB — HEPATITIS B SURFACE ANTIGEN: Hepatitis B Surface Ag: NEGATIVE

## 2022-11-28 LAB — HEPATITIS A ANTIBODY, TOTAL: hep A Total Ab: NEGATIVE

## 2022-11-28 LAB — LACTATE DEHYDROGENASE: LDH: 167 [IU]/L (ref 121–224)

## 2022-11-28 LAB — HEPATITIS B CORE ANTIBODY, TOTAL: Hep B Core Total Ab: NEGATIVE

## 2022-11-28 NOTE — Telephone Encounter (Signed)
Called with results.  ALP is rising.  Bilirubin is decreasing.  Unclear cause.  Will vaccinate against hepatitis A and B at follow-up.  Repeat with GGT, hepatic panel and vitamin D at follow-up.  This is primarily indirect and I do question if he has underlying Gilbert disease.  he has had a normal ultrasound. does have a history of alcohol use.  I discussed above with him.  All questions answered.  Follow-up in 3 months was discussed for repeat labs.

## 2022-11-29 ENCOUNTER — Other Ambulatory Visit (HOSPITAL_COMMUNITY): Payer: Self-pay

## 2022-11-29 DIAGNOSIS — I4891 Unspecified atrial fibrillation: Secondary | ICD-10-CM

## 2022-11-29 NOTE — Progress Notes (Signed)
Orders placed for DCCV with Dr. Shirlee Latch

## 2022-11-30 NOTE — OR Nursing (Signed)
Called patient with pre-procedure instructions for tomorrow.   Patient informed of:   Time to arrive for procedure.0830 Remain NPO past midnight.  Must have a ride home and a responsible adult to remain with them for 24 ours post procedure.  Confirmed blood thinner. Confirmed no breaks in taking blood thinner for 3+ weeks prior to procedure.

## 2022-12-01 ENCOUNTER — Ambulatory Visit (HOSPITAL_COMMUNITY): Payer: Medicare Other | Admitting: Anesthesiology

## 2022-12-01 ENCOUNTER — Encounter (HOSPITAL_COMMUNITY)
Admission: RE | Disposition: A | Discharge status: Home / Self Care | Payer: Self-pay | Source: Home / Self Care | Attending: Cardiology

## 2022-12-01 ENCOUNTER — Ambulatory Visit (HOSPITAL_COMMUNITY)
Admission: RE | Admit: 2022-12-01 | Discharge: 2022-12-01 | Disposition: A | Discharge status: Home / Self Care | Payer: Medicare Other | Attending: Cardiology | Admitting: Cardiology

## 2022-12-01 ENCOUNTER — Other Ambulatory Visit: Payer: Self-pay

## 2022-12-01 DIAGNOSIS — I5042 Chronic combined systolic (congestive) and diastolic (congestive) heart failure: Secondary | ICD-10-CM | POA: Diagnosis not present

## 2022-12-01 DIAGNOSIS — I110 Hypertensive heart disease with heart failure: Secondary | ICD-10-CM | POA: Diagnosis not present

## 2022-12-01 DIAGNOSIS — Z79899 Other long term (current) drug therapy: Secondary | ICD-10-CM | POA: Insufficient documentation

## 2022-12-01 DIAGNOSIS — I428 Other cardiomyopathies: Secondary | ICD-10-CM | POA: Diagnosis not present

## 2022-12-01 DIAGNOSIS — F17210 Nicotine dependence, cigarettes, uncomplicated: Secondary | ICD-10-CM | POA: Diagnosis not present

## 2022-12-01 DIAGNOSIS — I4891 Unspecified atrial fibrillation: Secondary | ICD-10-CM | POA: Insufficient documentation

## 2022-12-01 DIAGNOSIS — Z7901 Long term (current) use of anticoagulants: Secondary | ICD-10-CM | POA: Diagnosis not present

## 2022-12-01 DIAGNOSIS — I4819 Other persistent atrial fibrillation: Secondary | ICD-10-CM | POA: Diagnosis not present

## 2022-12-01 HISTORY — PX: CARDIOVERSION: SHX1299

## 2022-12-01 SURGERY — CARDIOVERSION
Anesthesia: General

## 2022-12-01 MED ORDER — PROPOFOL 10 MG/ML IV BOLUS
INTRAVENOUS | Status: DC | PRN
  Administered 2022-12-01: 30 mg via INTRAVENOUS
  Administered 2022-12-01: 50 mg via INTRAVENOUS

## 2022-12-01 MED ORDER — LIDOCAINE 2% (20 MG/ML) 5 ML SYRINGE
INTRAMUSCULAR | Status: DC | PRN
  Administered 2022-12-01: 60 mg via INTRAVENOUS

## 2022-12-01 MED ORDER — SODIUM CHLORIDE 0.9 % IV SOLN: INTRAVENOUS | Status: DC

## 2022-12-01 MED ORDER — AMIODARONE HCL 200 MG PO TABS: 200.0000 mg | ORAL_TABLET | Freq: Every day | ORAL | 3 refills | Status: DC

## 2022-12-01 SURGICAL SUPPLY — 1 items: PAD DEFIB RADIO PHYSIO CONN (PAD) ×1 IMPLANT

## 2022-12-01 NOTE — Interval H&P Note (Signed)
History and Physical Interval Note:  12/01/2022 9:30 AM  Shawn Perkins  has presented today for surgery, with the diagnosis of afib.  The various methods of treatment have been discussed with the patient and family. After consideration of risks, benefits and other options for treatment, the patient has consented to  Procedure(s): CARDIOVERSION (N/A) as a surgical intervention.  The patient's history has been reviewed, patient examined, no change in status, stable for surgery.  I have reviewed the patient's chart and labs.  Questions were answered to the patient's satisfaction.     [name] [employer]

## 2022-12-01 NOTE — Transfer of Care (Signed)
Immediate Anesthesia Transfer of Care Note  Patient: Shawn Perkins  Procedure(s) Performed: CARDIOVERSION  Patient Location: PACU  Anesthesia Type:MAC  Level of Consciousness: awake  Airway & Oxygen Therapy: Patient Spontanous Breathing and Patient connected to nasal cannula oxygen  Post-op Assessment: Report given to RN and Post -op Vital signs reviewed and stable  Post vital signs: Reviewed and stable  Last Vitals:  Vitals Value Taken Time  BP 107/83 12/01/22 0945  Temp 36.4 C 12/01/22 0943  Pulse 55 12/01/22 0946  Resp 21 12/01/22 0946  SpO2 95 % 12/01/22 0946  Vitals shown include unfiled device data.  Last Pain:  Vitals:   12/01/22 0943  TempSrc: Temporal  PainSc:          Complications: No notable events documented.

## 2022-12-01 NOTE — Anesthesia Postprocedure Evaluation (Signed)
Anesthesia Post Note  Patient: Shawn Perkins  Procedure(s) Performed: CARDIOVERSION     Patient location during evaluation: PACU Anesthesia Type: General Level of consciousness: awake and alert Pain management: pain level controlled Vital Signs Assessment: post-procedure vital signs reviewed and stable Respiratory status: spontaneous breathing, nonlabored ventilation and respiratory function stable Cardiovascular status: blood pressure returned to baseline Postop Assessment: no apparent nausea or vomiting Anesthetic complications: no   No notable events documented.  Last Vitals:  Vitals:   12/01/22 0943 12/01/22 0944  BP: 114/80 107/83  Pulse: (!) 57 (!) 56  Resp: (!) 21 (!) 32  Temp: (!) 36.4 C   SpO2:  98%    Last Pain:  Vitals:   12/01/22 0944  TempSrc:   PainSc: (P) 0-No pain                 Shanda Howells

## 2022-12-01 NOTE — Discharge Instructions (Addendum)
Decrease amiodarone to 20Electrical Cardioversion Electrical cardioversion is the delivery of a jolt of electricity to restore a normal rhythm to the heart. A rhythm that is too fast or is not regular (arrhythmia) keeps the heart from pumping blood well. There is also another type of cardioversion called a chemical (pharmacologic) cardioversion. This is when your health care provider gives you one or more medicines to bring back your regular heart rhythm. Electrical cardioversion is done as a scheduled procedure for arrhythmiasthat are not life-threatening. Electrical cardioversion may also be done in an emergency for sudden life-threatening arrhythmias. Tell a health care provider about: Any allergies you have. All medicines you are taking, including vitamins, herbs, eye drops, creams, and over-the-counter medicines. Any problems you or family members have had with sedatives or anesthesia. Any bleeding problems you have. Any surgeries you have had, including a pacemaker, defibrillator, or other implanted device. Any medical conditions you have. Whether you are pregnant or may be pregnant. What are the risks? Your provider will talk with you about risks. These include: Allergic reactions to medicines. Irritation to the skin on your chest or back where the sticky pads (electrodes) or paddles were put during electrical cardioversion. A blood clot that breaks free and travels to other parts of your body, such as your brain. Return of a worse abnormal heart rhythm that will need to be treated with medicines, a pacemaker, or an implantable cardioverter defibrillator (ICD). What happens before the procedure? Medicines Your provider may give you: Blood-thinning medicines (anticoagulants) so your blood does not clot as easily. If your provider gives you this medicine, you may need to take it for 4 weeks before the procedure. Medicines to help stabilize your heart rate and rhythm. Ask your provider  about: Changing or stopping your regular medicines. These include any diabetes medicines or blood thinners you take. Taking medicines such as aspirin and ibuprofen. These medicines can thin your blood. Do not take them unless your provider tells you to. Taking over-the-counter medicines, vitamins, herbs, and supplements. General instructions Follow instructions from your provider about what you may eat and drink. Do not put any lotions, powders, or ointments on your chest and back for 24 hours before the procedure. They can cause problems with the electrodes or paddles used to deliver electricity to your heart. Do not wear jewelry as this can interfere with delivering electricity to your heart. If you will be going home right after the procedure, plan to have a responsible adult: Take you home from the hospital or clinic. You will not be allowed to drive. Care for you for the time you are told. Tests You may have an exam or testing. This may include: Blood labs. A transesophageal echocardiogram (TEE). What happens during the procedure?     An IV will be inserted into one of your veins. You will be given a sedative. This helps you relax. Electrodes or metal paddles will be placed on your chest. They may be placed in one of these ways: One placed on your right chest, the other on the left ribs. One placed on your chest and the other on your back. An electrical shock will be delivered. The shock briefly stops (resets) your heart rhythm. Your provider will check to see if your heart rhythm is now normal. Some people need only one shock. Some need more to restore a normal heart rhythm. The procedure may vary among providers and hospitals. What happens after the procedure? Your blood pressure, heart rate, breathing rate,  and blood oxygen level will be monitored until you leave the hospital or clinic. Your heart rhythm will be watched to make sure it does not change. This information is not  intended to replace advice given to you by your health care provider. Make sure you discuss any questions you have with your health care provider. Document Revised: 09/29/2021 Document Reviewed: 09/29/2021 Elsevier Patient Education  2024 Elsevier Inc.0 mg daily

## 2022-12-01 NOTE — Anesthesia Preprocedure Evaluation (Signed)
Anesthesia Evaluation  Patient identified by MRN, date of birth, ID band Patient awake    Reviewed: Allergy & Precautions, NPO status , Patient's Chart, lab work & pertinent test results  History of Anesthesia Complications Negative for: history of anesthetic complications  Airway Mallampati: II  TM Distance: >3 FB Neck ROM: Full    Dental  (+) Poor Dentition, Missing,    Pulmonary Current Smoker and Patient abstained from smoking.   Pulmonary exam normal        Cardiovascular hypertension, +CHF  Normal cardiovascular exam+ dysrhythmias Atrial Fibrillation   TTE 08/2022: 1. Left ventricular ejection fraction, by estimation, is 45%. The left  ventricle has mildly decreased function. The left ventricle demonstrates  global hypokinesis. The left ventricular internal cavity size was mildly  dilated. Left ventricular diastolic  parameters are indeterminate.   2. Right ventricular systolic function is normal. The right ventricular  size is normal. There is normal pulmonary artery systolic pressure.   3. Left atrial size was severely dilated.   4. Right atrial size was moderately dilated.   5. The mitral valve is abnormal. Moderate to severe mitral valve  regurgitation. No evidence of mitral stenosis.   6. Tricuspid valve regurgitation is moderate to severe.   7. The aortic valve is tricuspid. There is mild calcification of the  aortic valve. There is mild thickening of the aortic valve. Aortic valve  regurgitation is mild to moderate. Aortic valve sclerosis is present, with  no evidence of aortic valve  stenosis.   8. Aortic dilatation noted. There is mild dilatation of the aortic root,  measuring 41 mm. There is mild dilatation of the ascending aorta,  measuring 41 mm.   9. The inferior vena cava is normal in size with greater than 50%  respiratory variability, suggesting right atrial pressure of 3 mmHg.       Neuro/Psych negative neurological ROS     GI/Hepatic negative GI ROS, Neg liver ROS,,,  Endo/Other  negative endocrine ROS    Renal/GU negative Renal ROS     Musculoskeletal negative musculoskeletal ROS (+)    Abdominal   Peds  Hematology negative hematology ROS (+)   Anesthesia Other Findings Day of surgery medications reviewed with patient.  Reproductive/Obstetrics                              Anesthesia Physical Anesthesia Plan  ASA: 3  Anesthesia Plan: General   Post-op Pain Management: Minimal or no pain anticipated   Induction: Intravenous  PONV Risk Score and Plan: Treatment may vary due to age or medical condition and Propofol infusion  Airway Management Planned: Mask  Additional Equipment: None  Intra-op Plan:   Post-operative Plan:   Informed Consent:   Plan Discussed with:   Anesthesia Plan Comments:          Anesthesia Quick Evaluation

## 2022-12-01 NOTE — Procedures (Signed)
Electrical Cardioversion Procedure Note Shawn Perkins [id number] 12-Jul-1955  Procedure: Electrical Cardioversion Indications:  Atrial Fibrillation  Procedure Details Consent: Risks of procedure as well as the alternatives and risks of each were explained to the (patient/caregiver).  Consent for procedure obtained. Time Out: Verified patient identification, verified procedure, site/side was marked, verified correct patient position, special equipment/implants available, medications/allergies/relevent history reviewed, required imaging and test results available.  Performed  Patient placed on cardiac monitor, pulse oximetry, supplemental oxygen as necessary.  Sedation given:  Propofol  Pacer pads placed anterior chest.  Cardioverted 1 time(s).  Cardioverted at 360J.  Evaluation Findings: Post procedure EKG shows: NSR Complications: None Patient did tolerate procedure well.   Shawn Perkins 12/01/2022, 9:39 AM

## 2022-12-04 ENCOUNTER — Ambulatory Visit: Payer: Medicare Other | Admitting: Cardiology

## 2022-12-04 ENCOUNTER — Encounter (HOSPITAL_COMMUNITY): Payer: Self-pay | Admitting: Cardiology

## 2022-12-05 ENCOUNTER — Encounter: Payer: Self-pay | Admitting: Cardiology

## 2022-12-11 ENCOUNTER — Ambulatory Visit (HOSPITAL_COMMUNITY)
Admission: RE | Admit: 2022-12-11 | Discharge: 2022-12-11 | Disposition: A | Discharge status: Home / Self Care | Payer: Medicare Other | Source: Ambulatory Visit | Attending: Family Medicine | Admitting: Family Medicine

## 2022-12-11 DIAGNOSIS — F17210 Nicotine dependence, cigarettes, uncomplicated: Secondary | ICD-10-CM | POA: Insufficient documentation

## 2022-12-11 DIAGNOSIS — Z720 Tobacco use: Secondary | ICD-10-CM | POA: Insufficient documentation

## 2022-12-11 DIAGNOSIS — Z122 Encounter for screening for malignant neoplasm of respiratory organs: Secondary | ICD-10-CM | POA: Insufficient documentation

## 2022-12-11 NOTE — Progress Notes (Signed)
Advanced Heart Failure Clinic Note   PCP: Westley Chandler, MD HF Cardiology: Dr. Shirlee Latch  HPI:  [name] is a 67 y.o. African-American male with a h/o severe HTN, tobacco and ETOH use, and chronic systolic HF/NICM.    Cath (11/11/10) which showed normal cors. EF 20%, RA 7, RV 51/4, PA 61/30, with a mean of 47, PCWP 30, CO 4.0. He was diuresed aggressively and started on GDMT. He was discharged home, weight was 219 lbs.   Admitted 11/2012 with a/c systolic heart failure in setting of medication noncompliance x several months, and severe HTN. HF medications restarted. Echo showed EF back down to 35-40% from 55%.  He was discharged home, weight 214 lbs.   Last seen 04/2020. Stable NYHA II and volume stable. Plan to repeat echo in 1 year.   Saw PCP 09/08/22 with SOB x 1 month. Found to be in new AF, rate controlled, and volume overloaded. Advised evaluation in ED, but patient declined. Started on Eliquis. BNP 600, and Lasix increased to BID. Echo 08/2022 showed EF 45%, RV normal, moderate to severe MR and TR   Follow up 09/2022, last seen 2022. Remained in AF, started amiodarone and titrated GDMT.   Underwent DCCV 11/09/22 x 3, unsuccessful.   Returned to [facility] for post DCCV follow up 11/13/22. Overall was feeling fair, felt worse since DCCV, he was now SOB walking up steps. 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 Reported taking Lasix a couple times a week. Denied palpitations, abnormal bleeding, CP, dizziness, edema, or PND/Orthopnea. Appetite was ok. No fever or chills. Weight at home was 238 pounds. Reported taking all medications. Smokes 2 cigs/day, no ETOH use.   Today he returns to HF clinic for pharmacist medication titration. At last visit with APP, amiodarone was increased to 400 mg daily and Farxiga 10 mg daily was initiated. Additionally, he was referred for repeat DCCV with Dr. Shirlee Latch in two weeks (completed 12/01/22). Amiodarone was decreased to 200 mg daily at that  time. Today says he feels ok. Main complaint is that he thinks the most recent DCCV did not work. Notes that last Wednesday he became very SOB. Had to go outside to get fresh air and felt palpitations during that time. Lasted for almost an hour before subsiding. The same symptoms of SOB and fluttering occurred yesterday as well. Does not feel any fluttering today and not SOB. Other than the two events documented above, patient feels overall his SOB has improved. Able to walk around the apartment complex for his job without stopping due to SOB (only stops if legs get tired or notes back pain as he has arthritis). Used to only be able to go up a few steps at a time before getting SOB, now able to walk up the whole flight of stairs. No dizziness or lightheadedness. No CP. Weight has been stable at home, 235 lbs. Has only needed two doses of PRN Lasix in the last month. No LEE, PND or orthopnea. Taking all medications as prescribed and tolerating all medications. EKG in clinic showed sinus bradycardia with 1st degree AV block. HR 54 bpm. Reviewed with Dr. Elwyn Lade.     HF Medications: Carvedilol 25 mg BID Farxiga 10 mg daily Lasix 40 mg PRN/KCL 20 mEq PRN  Has the patient been experiencing any side effects to the medications prescribed?  no  Does the patient have any problems obtaining medications due to transportation or finances?   Medicare part D. Difficulty  affording Eliquis. PAP in process. Provided samples today.   Understanding of regimen: good Understanding of indications: good Potential of compliance: good Patient understands to avoid NSAIDs. Patient understands to avoid decongestants.    Pertinent Lab Values: 11/27/22: Serum creatinine 1.21, BUN 11, Potassium 4.3, Sodium 142  Vital Signs: Weight: 239.2 lbs (last clinic weight: 238.8 lbs) Blood pressure: 120/72  Heart rate: 54   Assessment/Plan: 1. Chronic Diastolic Heart Failure: Nonischemic cardiomyopathy, possibly related to HTN.  Echo (2022) EF 55-60%. New AFib 08/2022, echo (08/2022) showed EF 45%, normal RV, moderate to severe TR/MR. Suspect drop in EF tachy-mediated. No chest pain or obvious ACS, defer LHC for now.  -NYHA II-IIb, he is not volume overloaded today. GDMT limited by recent hyperkalemia. - Continue Lasix 40 mg PRN + 20 KCL PRN - Continue carvedilol 25 mg BID. - Continue Farxiga 10 mg daily.  - Off Entresto and spironolactone with elevated K. Plan to re-challenge down the road.  2. Atrial fibrillation: Newly diagnosed 08/2022. Rate controlled. TSH normal. S/p unsuccessful DCCV 10/2022 x 3. Had repeat DCCV 12/01/22 - Continue amiodarone 200 mg daily.  - Continue Eliquis 5 mg BID. Pharmacy helping for patient assistance/samples -Has had two episodes of SOB/palpitations since his DCCV 12/01/22. Concerned he is going back into AF when those episodes recur. Discussed with Dr. Elwyn Lade today, will refer to EP to discuss possible AF ablation.  - Sleep study has been arranged.  3. HTN: Stable.  - GDMT as above. 4. Tobacco use: Discussed cessation. He is cutting down  5. ETOH: Quit x 2-3 months - Congratulated.  Follow up two weeks with Dr. Jonn Shingles, PharmD, BCPS, [facility], CPP Heart Failure Clinic Pharmacist [phone]

## 2022-12-22 ENCOUNTER — Other Ambulatory Visit: Payer: Self-pay | Admitting: Family Medicine

## 2022-12-22 ENCOUNTER — Encounter (HOSPITAL_COMMUNITY): Payer: Self-pay | Admitting: Family Medicine

## 2022-12-22 DIAGNOSIS — Z720 Tobacco use: Secondary | ICD-10-CM

## 2022-12-25 ENCOUNTER — Ambulatory Visit (HOSPITAL_COMMUNITY)
Admission: RE | Admit: 2022-12-25 | Discharge: 2022-12-25 | Disposition: A | Discharge status: Home / Self Care | Payer: Medicare Other | Source: Ambulatory Visit | Attending: Cardiology

## 2022-12-25 VITALS — BP 120/72 | HR 54 | Wt 239.2 lb

## 2022-12-25 DIAGNOSIS — I428 Other cardiomyopathies: Secondary | ICD-10-CM | POA: Diagnosis not present

## 2022-12-25 DIAGNOSIS — I110 Hypertensive heart disease with heart failure: Secondary | ICD-10-CM | POA: Insufficient documentation

## 2022-12-25 DIAGNOSIS — I480 Paroxysmal atrial fibrillation: Secondary | ICD-10-CM

## 2022-12-25 DIAGNOSIS — R001 Bradycardia, unspecified: Secondary | ICD-10-CM | POA: Insufficient documentation

## 2022-12-25 DIAGNOSIS — E875 Hyperkalemia: Secondary | ICD-10-CM | POA: Insufficient documentation

## 2022-12-25 DIAGNOSIS — I5022 Chronic systolic (congestive) heart failure: Secondary | ICD-10-CM | POA: Diagnosis not present

## 2022-12-25 DIAGNOSIS — Z79899 Other long term (current) drug therapy: Secondary | ICD-10-CM | POA: Insufficient documentation

## 2022-12-25 DIAGNOSIS — I5032 Chronic diastolic (congestive) heart failure: Secondary | ICD-10-CM | POA: Insufficient documentation

## 2022-12-25 DIAGNOSIS — Z7901 Long term (current) use of anticoagulants: Secondary | ICD-10-CM | POA: Insufficient documentation

## 2022-12-25 DIAGNOSIS — F17210 Nicotine dependence, cigarettes, uncomplicated: Secondary | ICD-10-CM | POA: Diagnosis not present

## 2022-12-25 DIAGNOSIS — I440 Atrioventricular block, first degree: Secondary | ICD-10-CM | POA: Insufficient documentation

## 2022-12-25 NOTE — Patient Instructions (Signed)
It was a pleasure seeing you today!  MEDICATIONS: -No medication changes today. You have been referred to EP to discuss possible atrial fibrillation ablation.  -Call if you have questions about your medications.   NEXT APPOINTMENT: Return to clinic in 2 weeks with Dr. Shirlee Latch.  In general, to take care of your heart failure: -Limit your fluid intake to 2 Liters (half-gallon) per day.   -Limit your salt intake to ideally 2-3 grams (2000-3000 mg) per day. -Weigh yourself daily and record, and bring that "weight diary" to your next appointment.  (Weight gain of 2-3 pounds in 1 day typically means fluid weight.) -The medications for your heart are to help your heart and help you live longer.   -Please contact us before stopping any of your heart medications.  Call the clinic at [phone] with questions or to reschedule future appointments.

## 2022-12-26 ENCOUNTER — Telehealth (HOSPITAL_COMMUNITY): Payer: Self-pay | Admitting: Pharmacist

## 2022-12-26 NOTE — Telephone Encounter (Signed)
Medication Samples have been provided to the patient.   Drug name: Eliquis     Strength: 5mg       Qty: 3 boxes                 LOT: [id number] Exp.Date: 03/2024   Dosing instructions: 1 tablet BID   The patient has been instructed regarding the correct time, dose, and frequency of taking this medication, including desired effects and most common side effects.   Karle Plumber, PharmD, BCPS, CPP Heart Failure Clinic Pharmacist [phone]

## 2022-12-28 DIAGNOSIS — I5032 Chronic diastolic (congestive) heart failure: Secondary | ICD-10-CM | POA: Insufficient documentation

## 2022-12-28 NOTE — Addendum Note (Signed)
Encounter addended by: Evon Slack, RPH-CPP on: 12/28/2022 8:55 AM  Actions taken: Problem List modified, Visit diagnoses modified, Diagnosis association updated

## 2022-12-29 ENCOUNTER — Ambulatory Visit (HOSPITAL_COMMUNITY)
Admission: RE | Admit: 2022-12-29 | Discharge: 2022-12-29 | Disposition: A | Discharge status: Home / Self Care | Payer: Medicare Other | Source: Ambulatory Visit | Attending: Family Medicine | Admitting: Family Medicine

## 2022-12-29 DIAGNOSIS — I700 Atherosclerosis of aorta: Secondary | ICD-10-CM | POA: Insufficient documentation

## 2022-12-29 DIAGNOSIS — F17210 Nicotine dependence, cigarettes, uncomplicated: Secondary | ICD-10-CM | POA: Insufficient documentation

## 2022-12-29 DIAGNOSIS — Z122 Encounter for screening for malignant neoplasm of respiratory organs: Secondary | ICD-10-CM | POA: Insufficient documentation

## 2022-12-29 DIAGNOSIS — Z720 Tobacco use: Secondary | ICD-10-CM

## 2022-12-29 DIAGNOSIS — J439 Emphysema, unspecified: Secondary | ICD-10-CM | POA: Insufficient documentation

## 2022-12-29 DIAGNOSIS — E278 Other specified disorders of adrenal gland: Secondary | ICD-10-CM | POA: Insufficient documentation

## 2022-12-29 DIAGNOSIS — I2510 Atherosclerotic heart disease of native coronary artery without angina pectoris: Secondary | ICD-10-CM | POA: Insufficient documentation

## 2022-12-29 DIAGNOSIS — I7121 Aneurysm of the ascending aorta, without rupture: Secondary | ICD-10-CM | POA: Diagnosis not present

## 2023-01-08 ENCOUNTER — Encounter (HOSPITAL_COMMUNITY): Payer: Self-pay | Admitting: Cardiology

## 2023-01-08 ENCOUNTER — Ambulatory Visit (HOSPITAL_BASED_OUTPATIENT_CLINIC_OR_DEPARTMENT_OTHER)
Admission: RE | Admit: 2023-01-08 | Discharge: 2023-01-08 | Disposition: A | Discharge status: Home / Self Care | Payer: Medicare Other | Source: Ambulatory Visit | Attending: Cardiology | Admitting: Cardiology

## 2023-01-08 ENCOUNTER — Ambulatory Visit (HOSPITAL_COMMUNITY)
Admission: RE | Admit: 2023-01-08 | Discharge: 2023-01-08 | Disposition: A | Discharge status: Home / Self Care | Payer: Medicare Other | Source: Ambulatory Visit | Attending: Family Medicine | Admitting: Family Medicine

## 2023-01-08 VITALS — BP 130/80 | HR 58 | Wt 236.0 lb

## 2023-01-08 DIAGNOSIS — R001 Bradycardia, unspecified: Secondary | ICD-10-CM | POA: Insufficient documentation

## 2023-01-08 DIAGNOSIS — I444 Left anterior fascicular block: Secondary | ICD-10-CM | POA: Diagnosis not present

## 2023-01-08 DIAGNOSIS — I110 Hypertensive heart disease with heart failure: Secondary | ICD-10-CM | POA: Insufficient documentation

## 2023-01-08 DIAGNOSIS — F17210 Nicotine dependence, cigarettes, uncomplicated: Secondary | ICD-10-CM | POA: Diagnosis not present

## 2023-01-08 DIAGNOSIS — I480 Paroxysmal atrial fibrillation: Secondary | ICD-10-CM | POA: Diagnosis not present

## 2023-01-08 DIAGNOSIS — M545 Low back pain, unspecified: Secondary | ICD-10-CM | POA: Diagnosis not present

## 2023-01-08 DIAGNOSIS — I5022 Chronic systolic (congestive) heart failure: Secondary | ICD-10-CM | POA: Diagnosis present

## 2023-01-08 DIAGNOSIS — Z7901 Long term (current) use of anticoagulants: Secondary | ICD-10-CM | POA: Insufficient documentation

## 2023-01-08 DIAGNOSIS — R0600 Dyspnea, unspecified: Secondary | ICD-10-CM | POA: Diagnosis not present

## 2023-01-08 DIAGNOSIS — I428 Other cardiomyopathies: Secondary | ICD-10-CM | POA: Insufficient documentation

## 2023-01-08 DIAGNOSIS — Z79899 Other long term (current) drug therapy: Secondary | ICD-10-CM | POA: Diagnosis not present

## 2023-01-08 DIAGNOSIS — I081 Rheumatic disorders of both mitral and tricuspid valves: Secondary | ICD-10-CM | POA: Diagnosis not present

## 2023-01-08 DIAGNOSIS — Z8619 Personal history of other infectious and parasitic diseases: Secondary | ICD-10-CM | POA: Insufficient documentation

## 2023-01-08 LAB — LIPID PANEL
Cholesterol: 94 mg/dL (ref 0–200)
HDL: 32 mg/dL — ABNORMAL LOW (ref >40)
LDL Cholesterol: 53 mg/dL (ref 0–99)
Total CHOL/HDL Ratio: 2.9 {ratio}
Triglycerides: 44 mg/dL (ref <150)
VLDL: 9 mg/dL (ref 0–40)

## 2023-01-08 LAB — COMPREHENSIVE METABOLIC PANEL
ALT: 22 U/L (ref 0–44)
AST: 16 U/L (ref 15–41)
Albumin: 3.7 g/dL (ref 3.5–5.0)
Alkaline Phosphatase: 129 U/L — ABNORMAL HIGH (ref 38–126)
Anion gap: 8 (ref 5–15)
BUN: 12 mg/dL (ref 8–23)
CO2: 22 mmol/L (ref 22–32)
Calcium: 9.6 mg/dL (ref 8.9–10.3)
Chloride: 109 mmol/L (ref 98–111)
Creatinine, Ser: 1.35 mg/dL — ABNORMAL HIGH (ref 0.61–1.24)
GFR, Estimated: 58 mL/min — ABNORMAL LOW (ref >60)
Glucose, Bld: 88 mg/dL (ref 70–99)
Potassium: 4.4 mmol/L (ref 3.5–5.1)
Sodium: 139 mmol/L (ref 135–145)
Total Bilirubin: 1.9 mg/dL — ABNORMAL HIGH (ref <1.2)
Total Protein: 7.4 g/dL (ref 6.5–8.1)

## 2023-01-08 LAB — BRAIN NATRIURETIC PEPTIDE: B Natriuretic Peptide: 924.5 pg/mL — ABNORMAL HIGH (ref 0.0–100.0)

## 2023-01-08 LAB — TSH: TSH: 2.356 u[IU]/mL (ref 0.350–4.500)

## 2023-01-08 MED ORDER — ENTRESTO 24-26 MG PO TABS: 1.0000 | ORAL_TABLET | Freq: Two times a day (BID) | ORAL | 11 refills | Status: DC

## 2023-01-08 NOTE — Progress Notes (Signed)
[facility]   Patient ID: Shawn Perkins, male   DOB: [birth year], 67 y.o.   MRN: [id number]  PCP: Westley Chandler, MD HF Cardiology: Dr. Shirlee Latch  HPI: Mr. Marcello is a 67 y.o. African-American male with a h/o severe HTN, tobacco and ETOH use, and chronic systolic HF/NICM.  Cath in 9/12 showed normal coronaries, echo at that time showed EF 35%.   Admitted 10/14 with acute/chronic systolic heart failure in setting of medication noncompliance x several months and severe HTN. HF medications restarted. Echo showed EF back down to 35-40% from 55%.  He was discharged home, weight 214 lbs.  He was then lost to cardiology followup for a number of years.   Saw PCP 09/08/22 with SOB x 1 month. Found to be in new atrial fibrillation and volume overloaded. Advised evaluation in ED, but patient declined. Started on Eliquis. BNP 600, and Lasix increased to bid. Echo 7/24 showed EF 45%, RV normal, moderate to severe MR and TR, mild-moderate AI.  Attempted DCCV 11/09/22 x 3, unsuccessful.  Repeat DCCV on amiodarone in 10/24 was successful with conversion to NSR.   Patient returns for followup of CHF, atrial fibrillation.  Rare palpitations now, he is in NSR today.  No dyspnea walking on flat ground. Mild dyspnea walking up stairs.  He has low back pain, planned for what sounds like a steroid injection.  Weight down 3 lbs.  He is still smoking 2 cigarettes/day, no ETOH.  No chest pain.  No lightheadedness.  Rarely takes Lasix.   ECG (personally reviewed): NSR, LAFB, nonspecific T wave changes.   Labs (5/20): K 4.1, creatinine 1.11, LDL 93  Labs (7/24): K 3.6, creatinine 1.14, TSH normal Labs (9/24): K 4.1, creatinine 1.15, BNP 56 Labs (10/24): K 4.3, creatinine 1.21, LFTs normal, TSH normal  PMH: 1. Chronic systolic CHF: Nonischemic cardiomyopathy.  - R/LHC (9/12): normal cors, RA 7, RV 51/4, PA 61/30, with a mean of 47, PCWP 30, CO 4.0. - Echo (9/12): EF 35% - Echo (12/12): EF 55-60% -  Echo (10/14): EF 35-40%  - Echo (4/15): EF 50-55%  - Echo (5/18): EF 55-60%, moderate LVH, normal RV size and systolic function. - Echo (2020): EF 55-60%  - Echo (7/24, in Afib): EF 45%, normal RV, moderate to severe MR, moderate to severe TR, mild-moderate AI with normal IVC.  2. Atrial fibrillation: Paroxysmal.  Failed DCCV in 9/24, successful DCCV in 10/24. 3. ABIs (3/19): Normal.  4. HTN 5. Prior cocaine abuse 6. Active smoker 7. H/o rheumatic fever as a child 8. Low back pain 9. Incarcerated umbilical hernia with ex lap and small bowel resection in 10/14.  10. Dilated ascending aorta: 4.1 cm in 7/24.   ROS: All systems negative except as listed in HPI, PMH and Problem List.  Family History  Problem Relation Age of Onset   Kidney failure Mother    Healthy Father    Heart disease Neg Hx    Diabetes Neg Hx    Hyperlipidemia Neg Hx    COPD Neg Hx    Colon cancer Neg Hx    Rectal cancer Neg Hx    Stomach cancer Neg Hx    Social History   Socioeconomic History   Marital status: Single    Spouse name: Not on file   Number of children: Not on file   Years of education: Not on file   Highest education level: Not on file  Occupational History   Not on  file  Tobacco Use   Smoking status: Some Days    Current packs/day: 0.30    Average packs/day: 0.1 packs/day for 42.9 years (5.3 ttl pk-yrs)    Types: Cigarettes    Start date: 02/21/1980   Smokeless tobacco: Never  Substance and Sexual Activity   Alcohol use: Yes    Alcohol/week: 4.0 standard drinks of alcohol    Types: 4 Cans of beer per week    Comment: 12/05/2012 "no more than 12 pack beer/week; haven't had anything in 3 wks"   Drug use: Yes    Types: Marijuana    Comment: 12/05/2012 used to smoke weed & crack cocaine; none in over 6 years ago"   Sexual activity: Not Currently  Other Topics Concern   Not on file  Social History Narrative   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), Saturdays at apartment complex     Social Determinants of Health   Financial Resource Strain: Not on file  Food Insecurity: Not on file  Transportation Needs: Not on file  Physical Activity: Not on file  Stress: Not on file  Social Connections: Not on file  Intimate Partner Violence: Not on file     Current Outpatient Medications  Medication Sig Dispense Refill   acetaminophen (TYLENOL) 500 MG tablet Take 500-1,000 mg by mouth every 6 (six) hours as needed (BACK PAIN.).     amiodarone (PACERONE) 200 MG tablet Take 1 tablet (200 mg total) by mouth daily. 60 tablet 3   apixaban (ELIQUIS) 5 MG TABS tablet Take 1 tablet (5 mg total) by mouth 2 (two) times daily.     aspirin EC 81 MG tablet Take 1 tablet (81 mg total) by mouth daily. 90 tablet 3   atorvastatin (LIPITOR) 40 MG tablet Take 1 tablet (40 mg total) by mouth daily. 90 tablet 3   carvedilol (COREG) 25 MG tablet Take 1 tablet (25 mg total) by mouth 2 (two) times daily. 180 tablet 3   dapagliflozin propanediol (FARXIGA) 10 MG TABS tablet Take 1 tablet (10 mg total) by mouth daily before breakfast. 30 tablet 5   furosemide (LASIX) 40 MG tablet Take 1 tablet (40 mg total) by mouth daily as needed (FOR SWELLING OR WEIGHT GAIN 3 POUNDS OVER NIGHT OR 5 POUNDS IN 1 WEEK). 90 tablet 1   potassium chloride SA (KLOR-CON M) 20 MEQ tablet Take 1 tablet (20 mEq total) by mouth daily as needed (TAKE (1) TABLET ON THE DAYS THAT YOU TAKE LASIX (FUROSEMIDE)). TAKE (1) TABLET ON THE DAYS THAT YOU TAKE LASIX (FUROSEMIDE) 30 tablet 3   sacubitril-valsartan (ENTRESTO) 24-26 MG Take 1 tablet by mouth 2 (two) times daily. 60 tablet 11   vitamin B-12 (CYANOCOBALAMIN) 1000 MCG tablet Take 1 tablet (1,000 mcg total) by mouth daily. 90 tablet 3   No current facility-administered medications for this encounter.   BP 130/80   Pulse (!) 58   Wt 107 kg (236 lb)   SpO2 98%   BMI 32.92 kg/m   Wt Readings from Last 3 Encounters:  01/08/23 107 kg (236 lb)  12/25/22 108.5 kg (239  lb 3.2 oz)  12/01/22 108 kg (238 lb)   PHYSICAL EXAM: General: NAD Neck: No JVD, no thyromegaly or thyroid nodule.  Lungs: Clear to auscultation bilaterally with normal respiratory effort. CV: Nondisplaced PMI.  Heart regular S1/S2, no S3/S4, 1/6 HSM apex.  No peripheral edema.  No carotid bruit.  Normal pedal pulses.  Abdomen: Soft, nontender, no hepatosplenomegaly,  no distention.  Skin: Intact without lesions or rashes.  Neurologic: Alert and oriented x 3.  Psych: Normal affect. Extremities: No clubbing or cyanosis.  HEENT: Normal.   ASSESSMENT & PLAN: 1. Chronic HF with mid range EF: Nonischemic cardiomyopathy, possibly related to HTN.  Cath in 9/12 with normal coronaries.  Echo in 2022 with EF 55-60%. New atrial fibrillation diagnosis in 7/24, echo in 7/24 showed EF 45%, normal RV, moderate to severe TR/MR. Drop in EF may be tachycardia-mediated. No chest pain or obvious ACS, defer LHC for now. NYHA class II, not volume overloaded on exam.  Has felt better since DCCV.  - Continue Farxiga 10 mg daily.  - Coreg 25 mg bid.  - Add Entresto 24/26 bid, BMET/BNP today and again in 10 days.  - Has Lasix for prn use.  - Repeat echo now that patient is in NSR to assess for improvement.  2. Atrial fibrillation: Newly diagnosed 7/24. S/p unsuccessful DCCV 9/24 x 3. Successful DCCV in 10/24, remains in NSR today.  - Continue amiodarone 200 mg daily.  He will need regular eye exam while on amiodarone. Check LFTs and TSH. - Continue Eliquis 5 mg bid.  - Sleep study has been arranged.  - Refer to EP for evaluation for AF ablation.  3. HTN: Adding Entresto as above.  4. Tobacco use: Discussed cessation. He is cutting down  5. ETOH: Quit for several months - Congratulated. 6. Valvular heart disease: Patient has a history of rheumatic fever as a child.  Moderate-severe MR and moderate-severe TR in setting of atrial fibrillation (had not been noted prior).  He does not have a prominent murmur.  -  Repeat echo in NSR to reassess valvular disease.   Followup 6 wks with APP.   Marca Ancona  01/08/2023

## 2023-01-08 NOTE — Patient Instructions (Addendum)
START Entresto 24/26 mg Twice daily  Labs done today, your results will be available in MyChart, we will contact you for abnormal readings.  Repeat blood work in 10 days.  You have been referred to the Electrophysiologist. They will call you to arrange your appointment.  Your echocardiogram has been rescheduled for December 5th at 1pm.  Your physician recommends that you schedule a follow-up appointment in: 6 weeks  If you have any questions or concerns before your next appointment please send [place] a message through [place] or call our office at [phone].    TO LEAVE A MESSAGE FOR THE NURSE SELECT OPTION 2, PLEASE LEAVE A MESSAGE INCLUDING: YOUR NAME DATE OF BIRTH CALL BACK NUMBER REASON FOR CALL**this is important as we prioritize the call backs  YOU WILL RECEIVE A CALL BACK THE SAME DAY AS LONG AS YOU CALL BEFORE 4:00 PM  At the [facility], you and your health needs are our priority. As part of our continuing mission to provide you with exceptional heart care, we have created designated Provider Care Teams. These Care Teams include your primary Cardiologist (physician) and Advanced Practice Providers (APPs- Physician Assistants and Nurse Practitioners) who all work together to provide you with the care you need, when you need it.   You may see any of the following providers on your designated Care Team at your next follow up: Dr Arvilla Meres Dr Marca Ancona Dr. Dorthula Nettles Dr. Clearnce Hasten Amy Filbert Schilder, NP Robbie Lis, [place] [facility] [place], [place] Brynda Peon, NP [place] Lee, NP Karle Plumber, PharmD   Please be sure to bring in all your medications bottles to every appointment.    Thank you for choosing [facility]

## 2023-01-22 ENCOUNTER — Ambulatory Visit (HOSPITAL_COMMUNITY)
Admission: RE | Admit: 2023-01-22 | Discharge: 2023-01-22 | Disposition: A | Discharge status: Home / Self Care | Payer: Medicare Other | Source: Ambulatory Visit | Attending: Cardiology | Admitting: Cardiology

## 2023-01-22 DIAGNOSIS — I5022 Chronic systolic (congestive) heart failure: Secondary | ICD-10-CM | POA: Insufficient documentation

## 2023-01-22 LAB — BASIC METABOLIC PANEL
Anion gap: 7 (ref 5–15)
BUN: 16 mg/dL (ref 8–23)
CO2: 23 mmol/L (ref 22–32)
Calcium: 9.2 mg/dL (ref 8.9–10.3)
Chloride: 109 mmol/L (ref 98–111)
Creatinine, Ser: 1.11 mg/dL (ref 0.61–1.24)
GFR, Estimated: >60 mL/min (ref >60)
Glucose, Bld: 117 mg/dL — ABNORMAL HIGH (ref 70–99)
Potassium: 4 mmol/L (ref 3.5–5.1)
Sodium: 139 mmol/L (ref 135–145)

## 2023-01-24 ENCOUNTER — Telehealth: Payer: Self-pay | Admitting: Family Medicine

## 2023-01-24 MED ORDER — DEXAMETHASONE 1 MG PO TABS: ORAL_TABLET | ORAL | 0 refills | Status: DC

## 2023-01-24 NOTE — Telephone Encounter (Signed)
Called and discussed findings.  Will need referral to thoracic for aneurysm.  Scheduled for visit to discuss adrenal lesion, overnight dexamethasone suppression test.  Rx for dexamethasone to take night before visits Needs renin/aldosterone, DHEAS, and AM cortisol on 03/02/23 Will also need imaging for adrenal lesion  Terisa Starr, MD  [facility] Medicine Teaching Service

## 2023-01-25 ENCOUNTER — Ambulatory Visit (HOSPITAL_COMMUNITY)
Admission: RE | Admit: 2023-01-25 | Discharge: 2023-01-25 | Disposition: A | Discharge status: Home / Self Care | Payer: Medicare Other | Source: Ambulatory Visit | Attending: Family Medicine | Admitting: Family Medicine

## 2023-01-25 DIAGNOSIS — I5022 Chronic systolic (congestive) heart failure: Secondary | ICD-10-CM | POA: Diagnosis not present

## 2023-01-25 DIAGNOSIS — I080 Rheumatic disorders of both mitral and aortic valves: Secondary | ICD-10-CM | POA: Diagnosis not present

## 2023-01-25 DIAGNOSIS — I509 Heart failure, unspecified: Secondary | ICD-10-CM | POA: Insufficient documentation

## 2023-01-25 LAB — ECHOCARDIOGRAM COMPLETE
AV Mean grad: 9 mm[Hg]
AV Peak grad: 14.9 mm[Hg]
Ao pk vel: 1.93 m/s
Area-P 1/2: 2.23 cm2
P 1/2 time: 630 ms
S' Lateral: 4.7 cm

## 2023-01-29 ENCOUNTER — Other Ambulatory Visit (HOSPITAL_COMMUNITY): Payer: Self-pay | Admitting: Family Medicine

## 2023-01-30 ENCOUNTER — Other Ambulatory Visit: Payer: Self-pay

## 2023-01-30 DIAGNOSIS — E785 Hyperlipidemia, unspecified: Secondary | ICD-10-CM

## 2023-01-31 MED ORDER — ATORVASTATIN CALCIUM 40 MG PO TABS: 40.0000 mg | ORAL_TABLET | Freq: Every day | ORAL | 3 refills | Status: AC

## 2023-02-15 NOTE — Progress Notes (Signed)
Electrophysiology Office Note:   Date:  02/16/2023  ID:  Shawn Perkins, DOB 22-Apr-1955, MRN [id number]  Primary Cardiologist: None Primary Heart Failure: None Electrophysiologist: Nobie Putnam, MD      History of Present Illness:   Shawn Perkins is a 67 y.o. male with h/o HTN, tobacco and ETOH use, and chronic systolic heart failure secondary to NICM who is being seen today for evaluation of persistent atrial fibrillation at the request of Dr. Shirlee Latch.   Patient saw his PCP 09/08/22 was complaining of shortness of breath for several weeks.  He was found to be in new atrial fibrillation with onset of volume overload on exam, consistent with acute decompensated heart failure. Advised evaluation in ED, but patient declined.  He was started on Eliquis. Echo 7/24 showed EF 45%, slight drop from normal, possibly tachy-arrhythmia mediate. Attempted DCCV 11/09/22 x 3 but was unsuccessful.  Amiodarone was started and DCCV was repeated on 10/24 with successful conversion to sinus rhythm.  Reports doing better since cardioversion.  Shortness of breath has resolved.  Yesterday, after getting out of bed quickly in the morning he reports passing out.  He recovered quickly with no deficits.  He states this has happened to him before often when quickly standing up from a seated position.  Currently denies any chest pain, dizziness, lightheadedness, palpitations, lower extremity edema.  Review of systems complete and found to be negative unless listed in HPI.   EP Information / Studies Reviewed:    EKG is not ordered today. EKG from 11/09/22 reviewed which showed coarse atrial fibrillation.      Echo 01/25/23:  Low normal LV function, LVEF 50 to 55%.  Mildly dilated LV.  Mild concentric LVH. Normal RV size and function. Left atrium mildly dilated.  RA normal in size. Mild mitral stenosis, mean gradient . Aortic sclerosis without stenosis.  Risk Assessment/Calculations:    CHA2DS2-VASc Score = 3   This  indicates a 3.2% annual risk of stroke. The patient's score is based upon: CHF History: 1 HTN History: 1 Diabetes History: 0 Stroke History: 0 Vascular Disease History: 0 Age Score: 1 Gender Score: 0             Physical Exam:   VS:  BP 102/66   Pulse (!) 56   Ht 5\' 11"  (1.803 m)   Wt 227 lb (103 kg)   SpO2 98%   BMI 31.66 kg/m    Wt Readings from Last 3 Encounters:  02/16/23 227 lb (103 kg)  01/08/23 236 lb (107 kg)  12/25/22 239 lb 3.2 oz (108.5 kg)     GEN: Well nourished, well developed in no acute distress NECK: No JVD CARDIAC: Normal rate, regular rhythm RESPIRATORY:  Clear to auscultation without rales, wheezing or rhonchi  ABDOMEN: Soft, non-tender, non-distended EXTREMITIES:  No edema; No deformity   ASSESSMENT AND PLAN:   Shawn Perkins is a 67 y.o. male with h/o HTN, tobacco and ETOH use, and chronic systolic heart failure secondary to NICM who is being seen today for evaluation of persistent atrial fibrillation at the request of Dr. Shirlee Latch.   #Persistent atrial fibrillation, symptomatic: Likely the etiology of his decompensated heart failure back in July. -Discussed treatment options today for AF including antiarrhythmic drug therapy and ablation.  He is antiarrhythmic options are amiodarone, which carries a long-term risk, and dofetilide.  Discussed risks, recovery and likelihood of success with each treatment strategy. Risk, benefits, and alternatives to EP study and ablation for afib  were discussed. These risks include but are not limited to stroke, bleeding, vascular damage, tamponade, perforation, damage to the esophagus, lungs, phrenic nerve and other structures, pulmonary vein stenosis, worsening renal function, coronary vasospasm and death.  Discussed potential need for repeat ablation procedures and antiarrhythmic drugs after an initial ablation. The patient understands these risk and wishes to proceed.  We will therefore proceed with catheter ablation at the  next available time.  Carto, ICE, anesthesia are requested for the procedure.  Will also obtain CT PV protocol prior to the procedure to exclude LAA thrombus and further evaluate atrial anatomy. -Continue amiodarone as a bridge to ablation.   #Secondary hypercoagulable state due to atrial fibrillation: -CHA2DS2-VASc score of 3. -Continue Eliquis.  He does not want to switch to warfarin.  #Chronic systolic heart failure secondary to non-ischemic cardiomyopathy: Well compensated today.  Etiology for his last decompensation was likely atrial fibrillation.  He has done well since maintaining normal rhythm on amiodarone. -Continue GDMT and excellent care with our HF colleagues.   #Orthostatic syncope:  -Normotensive today and euvolemic. -Advised patient to stand up slowly from seated positions, to sit on edge of bed prior to standing after lying flat and fall precautions provided  Signed, Nobie Putnam, MD

## 2023-02-16 ENCOUNTER — Ambulatory Visit: Payer: Medicare Other | Attending: Cardiology | Admitting: Cardiology

## 2023-02-16 ENCOUNTER — Encounter: Payer: Self-pay | Admitting: Cardiology

## 2023-02-16 VITALS — BP 102/66 | HR 56 | Ht 71.0 in | Wt 227.0 lb

## 2023-02-16 DIAGNOSIS — I4819 Other persistent atrial fibrillation: Secondary | ICD-10-CM

## 2023-02-16 DIAGNOSIS — D6869 Other thrombophilia: Secondary | ICD-10-CM

## 2023-02-16 DIAGNOSIS — I951 Orthostatic hypotension: Secondary | ICD-10-CM

## 2023-02-16 DIAGNOSIS — I5022 Chronic systolic (congestive) heart failure: Secondary | ICD-10-CM

## 2023-02-16 NOTE — Patient Instructions (Signed)
Medication Instructions:  Your physician recommends that you continue on your current medications as directed. Please refer to the Current Medication list given to you today.  *If you need a refill on your cardiac medications before your next appointment, please call your pharmacy*  Lab Work: BMET and CBC at [facility] prior to your ablation  Testing/Procedures: Cardiac CT Your physician has requested that you have cardiac CT. Cardiac computed tomography (CT) is a painless test that uses an x-ray machine to take clear, detailed pictures of your heart. For further information please visit [email or web address] Please follow instruction sheet as given.  Ablation Your physician has recommended that you have an ablation. Catheter ablation is a medical procedure used to treat some cardiac arrhythmias (irregular heartbeats). During catheter ablation, a long, thin, flexible tube is put into a blood vessel in your groin (upper thigh), or neck. This tube is called an ablation catheter. It is then guided to your heart through the blood vessel. Radio frequency waves destroy small areas of heart tissue where abnormal heartbeats may cause an arrhythmia to start. Please see the instruction sheet given to you today.   Follow-Up: At [facility], you and your health needs are our priority.  As part of our continuing mission to provide you with exceptional heart care, we have created designated Provider Care Teams.  These Care Teams include your primary Cardiologist (physician) and Advanced Practice Providers (APPs -  Physician Assistants and Nurse Practitioners) who all work together to provide you with the care you need, when you need it.   Your next appointment:   We will call you to arrange your follow up appointments

## 2023-02-16 NOTE — Progress Notes (Incomplete)
[facility]   Patient ID: Shawn Perkins, male   DOB: 04-Dec-1955, 67 y.o.   MRN: [id number]  PCP: Westley Chandler, MD HF Cardiology: Dr. Shirlee Latch  HPI: Shawn Perkins is a 67 y.o. African-American male with a h/o severe HTN, tobacco and ETOH use, and chronic systolic HF/NICM.  Cath in 9/12 showed normal coronaries, echo at that time showed EF 35%.   Admitted 10/14 with acute/chronic systolic heart failure in setting of medication noncompliance x several months and severe HTN. HF medications restarted. Echo showed EF back down to 35-40% from 55%.  He was discharged home, weight 214 lbs.  He was then lost to cardiology followup for a number of years.   Saw PCP 09/08/22 with SOB x 1 month. Found to be in new atrial fibrillation and volume overloaded. Advised evaluation in ED, but patient declined. Started on Eliquis. BNP 600, and Lasix increased to bid. Echo 7/24 showed EF 45%, RV normal, moderate to severe MR and TR, mild-moderate AI.  Attempted DCCV 11/09/22 x 3, unsuccessful.  Repeat DCCV on amiodarone in 10/24 was successful with conversion to NSR.   Patient returns for followup of CHF, atrial fibrillation.  Rare palpitations now, he is in NSR today.  No dyspnea walking on flat ground. Mild dyspnea walking up stairs.  He has low back pain, planned for what sounds like a steroid injection.  Weight down 3 lbs.  He is still smoking 2 cigarettes/day, no ETOH.  No chest pain.  No lightheadedness.  Rarely takes Lasix.   ECG (personally reviewed): NSR, LAFB, nonspecific T wave changes.   Labs (5/20): K 4.1, creatinine 1.11, LDL 93  Labs (7/24): K 3.6, creatinine 1.14, TSH normal Labs (9/24): K 4.1, creatinine 1.15, BNP 56 Labs (10/24): K 4.3, creatinine 1.21, LFTs normal, TSH normal  PMH: 1. Chronic systolic CHF: Nonischemic cardiomyopathy.  - R/LHC (9/12): normal cors, RA 7, RV 51/4, PA 61/30, with a mean of 47, PCWP 30, CO 4.0. - Echo (9/12): EF 35% - Echo (12/12): EF 55-60% -  Echo (10/14): EF 35-40%  - Echo (4/15): EF 50-55%  - Echo (5/18): EF 55-60%, moderate LVH, normal RV size and systolic function. - Echo (2020): EF 55-60%  - Echo (7/24, in Afib): EF 45%, normal RV, moderate to severe MR, moderate to severe TR, mild-moderate AI with normal IVC.  2. Atrial fibrillation: Paroxysmal.  Failed DCCV in 9/24, successful DCCV in 10/24. 3. ABIs (3/19): Normal.  4. HTN 5. Prior cocaine abuse 6. Active smoker 7. H/o rheumatic fever as a child 8. Low back pain 9. Incarcerated umbilical hernia with ex lap and small bowel resection in 10/14.  10. Dilated ascending aorta: 4.1 cm in 7/24.   ROS: All systems negative except as listed in HPI, PMH and Problem List.  Family History  Problem Relation Age of Onset   Kidney failure Mother    Healthy Father    Heart disease Neg Hx    Diabetes Neg Hx    Hyperlipidemia Neg Hx    COPD Neg Hx    Colon cancer Neg Hx    Rectal cancer Neg Hx    Stomach cancer Neg Hx    Social History   Socioeconomic History   Marital status: Single    Spouse name: Not on file   Number of children: Not on file   Years of education: Not on file   Highest education level: Not on file  Occupational History   Not on  file  Tobacco Use   Smoking status: Some Days    Current packs/day: 0.30    Average packs/day: 0.1 packs/day for 43.0 years (5.3 ttl pk-yrs)    Types: Cigarettes    Start date: 02/21/1980   Smokeless tobacco: Never  Substance and Sexual Activity   Alcohol use: Yes    Alcohol/week: 4.0 standard drinks of alcohol    Types: 4 Cans of beer per week    Comment: 12/05/2012 "no more than 12 pack beer/week; haven't had anything in 3 wks"   Drug use: Yes    Types: Marijuana    Comment: 12/05/2012 used to smoke weed & crack cocaine; none in over 6 years ago"   Sexual activity: Not Currently  Other Topics Concern   Not on file  Social History Narrative   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), Saturdays at apartment complex     Social Drivers of Corporate investment banker Strain: Not on file  Food Insecurity: Not on file  Transportation Needs: Not on file  Physical Activity: Not on file  Stress: Not on file  Social Connections: Not on file  Intimate Partner Violence: Not on file     Current Outpatient Medications  Medication Sig Dispense Refill   acetaminophen (TYLENOL) 500 MG tablet Take 500-1,000 mg by mouth every 6 (six) hours as needed (BACK PAIN.).     amiodarone (PACERONE) 200 MG tablet TAKE 2 TABLETS BY MOUTH EVERY DAY 180 tablet 1   apixaban (ELIQUIS) 5 MG TABS tablet Take 1 tablet (5 mg total) by mouth 2 (two) times daily.     aspirin EC 81 MG tablet Take 1 tablet (81 mg total) by mouth daily. 90 tablet 3   atorvastatin (LIPITOR) 40 MG tablet Take 1 tablet (40 mg total) by mouth daily. 90 tablet 3   carvedilol (COREG) 25 MG tablet Take 1 tablet (25 mg total) by mouth 2 (two) times daily. 180 tablet 3   dapagliflozin propanediol (FARXIGA) 10 MG TABS tablet Take 1 tablet (10 mg total) by mouth daily before breakfast. 30 tablet 5   [START ON 03/01/2023] dexamethasone (DECADRON) 1 MG tablet TAKE ON NIGHT OF JANUARY 9 FOR LABS 1 tablet 0   furosemide (LASIX) 40 MG tablet Take 1 tablet (40 mg total) by mouth daily as needed (FOR SWELLING OR WEIGHT GAIN 3 POUNDS OVER NIGHT OR 5 POUNDS IN 1 WEEK). 90 tablet 1   potassium chloride SA (KLOR-CON M) 20 MEQ tablet TAKE 2 BY MOUTH DAILY AS NEEDED (TAKE (2) TABLETS ON DAYS THAT YOU TAKE LASIX (FUROSEMIDE)). 180 tablet 1   sacubitril-valsartan (ENTRESTO) 24-26 MG Take 1 tablet by mouth 2 (two) times daily. 60 tablet 11   vitamin B-12 (CYANOCOBALAMIN) 1000 MCG tablet Take 1 tablet (1,000 mcg total) by mouth daily. 90 tablet 3   No current facility-administered medications for this visit.   There were no vitals taken for this visit.  Wt Readings from Last 3 Encounters:  02/16/23 103 kg (227 lb)  01/08/23 107 kg (236 lb)  12/25/22 108.5 kg (239 lb 3.2 oz)    PHYSICAL EXAM: General: NAD Neck: No JVD, no thyromegaly or thyroid nodule.  Lungs: Clear to auscultation bilaterally with normal respiratory effort. CV: Nondisplaced PMI.  Heart regular S1/S2, no S3/S4, 1/6 HSM apex.  No peripheral edema.  No carotid bruit.  Normal pedal pulses.  Abdomen: Soft, nontender, no hepatosplenomegaly, no distention.  Skin: Intact without lesions or rashes.  Neurologic: Alert and oriented  x 3.  Psych: Normal affect. Extremities: No clubbing or cyanosis.  HEENT: Normal.   ASSESSMENT & PLAN: 1. Chronic HF with mid range EF: Nonischemic cardiomyopathy, possibly related to HTN.  Cath in 9/12 with normal coronaries.  Echo in 2022 with EF 55-60%. New atrial fibrillation diagnosis in 7/24, echo in 7/24 showed EF 45%, normal RV, moderate to severe TR/MR. Drop in EF may be tachycardia-mediated. No chest pain or obvious ACS, defer LHC for now. NYHA class II, not volume overloaded on exam.  Has felt better since DCCV.  - Continue Farxiga 10 mg daily.  - Coreg 25 mg bid.  - Add Entresto 24/26 bid, BMET/BNP today and again in 10 days.  - Has Lasix for prn use.  - Repeat echo now that patient is in NSR to assess for improvement.  2. Atrial fibrillation: Newly diagnosed 7/24. S/p unsuccessful DCCV 9/24 x 3. Successful DCCV in 10/24, remains in NSR today.  - Continue amiodarone 200 mg daily.  He will need regular eye exam while on amiodarone. Check LFTs and TSH. - Continue Eliquis 5 mg bid.  - Sleep study has been arranged.  - Refer to EP for evaluation for AF ablation.  3. HTN: Adding Entresto as above.  4. Tobacco use: Discussed cessation. He is cutting down  5. ETOH: Quit for several months - Congratulated. 6. Valvular heart disease: Patient has a history of rheumatic fever as a child.  Moderate-severe MR and moderate-severe TR in setting of atrial fibrillation (had not been noted prior).  He does not have a prominent murmur.  - Repeat echo in NSR to reassess valvular  disease.   Followup 6 wks with APP.   Anderson Malta [facility]  02/16/2023

## 2023-02-19 ENCOUNTER — Encounter (HOSPITAL_COMMUNITY): Payer: Self-pay

## 2023-02-19 ENCOUNTER — Ambulatory Visit (HOSPITAL_COMMUNITY)
Admission: RE | Admit: 2023-02-19 | Discharge: 2023-02-19 | Disposition: A | Discharge status: Home / Self Care | Payer: Medicare Other | Source: Ambulatory Visit | Attending: Family Medicine | Admitting: Family Medicine

## 2023-02-19 VITALS — BP 92/60 | HR 60 | Wt 229.2 lb

## 2023-02-19 DIAGNOSIS — I10 Essential (primary) hypertension: Secondary | ICD-10-CM | POA: Diagnosis not present

## 2023-02-19 DIAGNOSIS — Z720 Tobacco use: Secondary | ICD-10-CM | POA: Diagnosis not present

## 2023-02-19 DIAGNOSIS — I4891 Unspecified atrial fibrillation: Secondary | ICD-10-CM | POA: Insufficient documentation

## 2023-02-19 DIAGNOSIS — I110 Hypertensive heart disease with heart failure: Secondary | ICD-10-CM | POA: Insufficient documentation

## 2023-02-19 DIAGNOSIS — I081 Rheumatic disorders of both mitral and tricuspid valves: Secondary | ICD-10-CM | POA: Diagnosis not present

## 2023-02-19 DIAGNOSIS — I4819 Other persistent atrial fibrillation: Secondary | ICD-10-CM

## 2023-02-19 DIAGNOSIS — I5022 Chronic systolic (congestive) heart failure: Secondary | ICD-10-CM | POA: Insufficient documentation

## 2023-02-19 DIAGNOSIS — I428 Other cardiomyopathies: Secondary | ICD-10-CM | POA: Insufficient documentation

## 2023-02-19 DIAGNOSIS — F1091 Alcohol use, unspecified, in remission: Secondary | ICD-10-CM | POA: Diagnosis not present

## 2023-02-19 DIAGNOSIS — F1011 Alcohol abuse, in remission: Secondary | ICD-10-CM

## 2023-02-19 DIAGNOSIS — F17210 Nicotine dependence, cigarettes, uncomplicated: Secondary | ICD-10-CM | POA: Diagnosis not present

## 2023-02-19 DIAGNOSIS — Z7901 Long term (current) use of anticoagulants: Secondary | ICD-10-CM | POA: Insufficient documentation

## 2023-02-19 DIAGNOSIS — I38 Endocarditis, valve unspecified: Secondary | ICD-10-CM

## 2023-02-19 MED ORDER — BLOOD PRESSURE CUFF MISC: 0 refills | Status: AC

## 2023-02-19 MED ORDER — LOSARTAN POTASSIUM 25 MG PO TABS: 25.0000 mg | ORAL_TABLET | Freq: Every day | ORAL | 3 refills | Status: DC

## 2023-02-19 NOTE — Patient Instructions (Signed)
Thank you for coming in today  If you had labs drawn today, any labs that are abnormal the clinic will call you No news is good news  Medications: Stop Sherryll Burger Stop Aspirin START 02/20/2023 Losartan 25 mg 1 tablet daily  You were given Eliquis Samples today  You were sent a prescription for a blood pressure cuff please monitor and check daily   Follow up appointments:  Your physician recommends that you schedule a follow-up appointment in:  4 months With Dr. Earlean Shawl will receive a reminder letter in the mail a few months in advance. If you don't receive a letter, please call our office to schedule the follow-up appointment.    Do the following things EVERYDAY: Weigh yourself in the morning before breakfast. Write it down and keep it in a log. Take your medicines as prescribed Eat low salt foods--Limit salt (sodium) to 2000 mg per day.  Stay as active as you can everyday Limit all fluids for the day to less than 2 liters   At the [facility], you and your health needs are our priority. As part of our continuing mission to provide you with exceptional heart care, we have created designated Provider Care Teams. These Care Teams include your primary Cardiologist (physician) and Advanced Practice Providers (APPs- Physician Assistants and Nurse Practitioners) who all work together to provide you with the care you need, when you need it.   You may see any of the following providers on your designated Care Team at your next follow up: Dr Arvilla Meres Dr Marca Ancona Dr. Marcos Eke, NP Robbie Lis, [place] [facility] [place], [place] Brynda Peon, NP Karle Plumber, PharmD   Please be sure to bring in all your medications bottles to every appointment.    Thank you for choosing [facility]  If you have any questions or concerns before your next appointment please send [place] a message through  [place] or call our office at [phone].    TO LEAVE A MESSAGE FOR THE NURSE SELECT OPTION 2, PLEASE LEAVE A MESSAGE INCLUDING: YOUR NAME DATE OF BIRTH CALL BACK NUMBER REASON FOR CALL**this is important as we prioritize the call backs  YOU WILL RECEIVE A CALL BACK THE SAME DAY AS LONG AS YOU CALL BEFORE 4:00 PM

## 2023-03-01 ENCOUNTER — Telehealth: Payer: Self-pay

## 2023-03-01 NOTE — Progress Notes (Signed)
 SUBJECTIVE:   CHIEF COMPLAINT: CT  HPI:   Shawn Perkins is a 68 y.o.  with history notable for atrial fibrillation, back pain, heart failure with improved EF (50%)  presenting for follow up  The patient reports at the end of his visit today he recently syncopized.  This is about 3 to 4 weeks ago.  He has had no similar episodes since.  He feels dizzy all the time when he stands up.  His medications are listed below he is taking carvedilol  25 mg losartan  and Farxiga .  He is also on amiodarone .  He denies chest pain, hitting his head edema or dyspnea or palpitations with this episode.  He was actually getting out of bed and simply fell back on his pillows.  He declines emergency department evaluation today.  Patient is still smoking. CT showed several findings. Reviewed today.  He is trying to cut down on smoking and declines medication patches or gums today.  He reports his back injection did  work a bit.  He still has some back pain when sitting for prolonged periods of time but his back pain with walking is really improved.   The patient reports compliance with his medications.  He got a new Medicare part D with [employer].  He is still getting apixaban  samples.  He took his last apixaban  sample today.  He has not missed a single dose.  PERTINENT  PMH / PSH/Family/Social History :   Current Outpatient Medications:    amiodarone  (PACERONE ) 200 MG tablet, TAKE 2 TABLETS BY MOUTH EVERY DAY, Disp: 180 tablet, Rfl: 1   atorvastatin  (LIPITOR) 40 MG tablet, Take 1 tablet (40 mg total) by mouth daily., Disp: 90 tablet, Rfl: 3   dapagliflozin  propanediol (FARXIGA ) 10 MG TABS tablet, Take 1 tablet (10 mg total) by mouth daily before breakfast., Disp: 30 tablet, Rfl: 5   dexamethasone  (DECADRON ) 1 MG tablet, TAKE ON NIGHT OF JANUARY 9 FOR LABS, Disp: 1 tablet, Rfl: 0   furosemide  (LASIX ) 40 MG tablet, Take 1 tablet (40 mg total) by mouth daily as needed (FOR SWELLING OR WEIGHT GAIN 3 POUNDS OVER NIGHT  OR 5 POUNDS IN 1 WEEK)., Disp: 90 tablet, Rfl: 1   losartan  (COZAAR ) 25 MG tablet, Take 1 tablet (25 mg total) by mouth daily., Disp: 90 tablet, Rfl: 3   potassium chloride  SA (KLOR-CON  M) 20 MEQ tablet, TAKE 2 BY MOUTH DAILY AS NEEDED (TAKE 40MEQ (2) TABLETS ON DAYS THAT YOU TAKE LASIX  (FUROSEMIDE ))., Disp: 180 tablet, Rfl: 1   acetaminophen  (TYLENOL ) 500 MG tablet, Take 500-1,000 mg by mouth every 6 (six) hours as needed (BACK PAIN.)., Disp: , Rfl:    apixaban  (ELIQUIS ) 5 MG TABS tablet, Take 1 tablet (5 mg total) by mouth 2 (two) times daily., Disp: 180 tablet, Rfl: 3   Blood Pressure Monitoring (BLOOD PRESSURE CUFF) MISC, Please check and monitor blood pressure daily, Disp: 1 each, Rfl: 0   carvedilol  (COREG ) 12.5 MG tablet, Take 1 tablet (12.5 mg total) by mouth 2 (two) times daily., Disp: 60 tablet, Rfl: 1   vitamin B-12 (CYANOCOBALAMIN ) 1000 MCG tablet, Take 1 tablet (1,000 mcg total) by mouth daily., Disp: 90 tablet, Rfl: 3   OBJECTIVE:   BP (!) 79/61 (BP Location: Left Arm, Patient Position: Standing, Cuff Size: Normal)   Pulse 66   Ht 5' 11 (1.803 m)   Wt 234 lb 6.4 oz (106.3 kg)   SpO2 98%   BMI 32.69 kg/m  Today's weight:  Last Weight  Most recent update: 03/02/2023  8:13 AM    Weight  106.3 kg (234 lb 6.4 oz)            Review of prior weights: Filed Weights   03/02/23 0813  Weight: 234 lb 6.4 oz (106.3 kg)     Cardiac: Irregular  rate and rhythm. Normal S1/S2. No murmurs, rubs, or gallops appreciated. Lungs: Clear bilaterally to ascultation.  Psych: Pleasant and appropriate   Neuro: CN II: PERRL CN III, IV,VI: EOMI CV V: Normal sensation in V1, V2, V3 CVII: Symmetric smile and brow raise CN VIII: Normal hearing CN IX,X: Symmetric palate raise  CN XI: 5/5 shoulder shrug CN XII: Symmetric tongue protrusion  UE and LE strength 5/5    ASSESSMENT/PLAN:   Assessment & Plan Lesion of adrenal gland (HCC) He took his dexamethasone  last night.  DHEA-S  renin aldosterone level and a.m. cortisol today.  MRI is ordered and scheduled. Aneurysm of ascending aorta without rupture (HCC) Discussed at length.  Discussed reducing cigarettes to reduce risk of rupture.  He is has clearly excellent hypertension control. Elevated bilirubin Repeat hepatic function today Atrial fibrillation, unspecified type (HCC) Refilled apixaban  he will message with the cost.  I also gave him another weeks worth of samples.  It is imperative that the patient continue to take his Eliquis  prior to his ablation please give him another Eliquis  sample if he calls that he has run out platelet. Syncope, unspecified syncope type Discussed options for ED evaluation.  He declines.  Neurologic exam reassuring.  This was several weeks ago.  Will message his cardiologist to confirm if he wants additional dosing adjusted.  I have reduced his carvedilol  from 25 g twice daily to 12.5 given his relative bradycardia.  I told the patient I would message him if his cardiologist preferred an alternative.   Suzann Daring, MD  Family Medicine Teaching Service  [facility]

## 2023-03-01 NOTE — Telephone Encounter (Signed)
 Patient LVM on nurse line regarding concerns with appointment tomorrow and medication questions.   Attempted to return call to patient to get more information.   Patient did not answer, LVM asking that he return call to office to get more information.   Chiquita JAYSON English, RN

## 2023-03-02 ENCOUNTER — Encounter: Payer: Self-pay | Admitting: Family Medicine

## 2023-03-02 ENCOUNTER — Ambulatory Visit (INDEPENDENT_AMBULATORY_CARE_PROVIDER_SITE_OTHER): Payer: Medicare (Managed Care) | Admitting: Family Medicine

## 2023-03-02 ENCOUNTER — Telehealth: Payer: Self-pay

## 2023-03-02 VITALS — BP 79/61 | HR 66 | Ht 71.0 in | Wt 234.4 lb

## 2023-03-02 DIAGNOSIS — I7121 Aneurysm of the ascending aorta, without rupture: Secondary | ICD-10-CM

## 2023-03-02 DIAGNOSIS — R17 Unspecified jaundice: Secondary | ICD-10-CM

## 2023-03-02 DIAGNOSIS — I4891 Unspecified atrial fibrillation: Secondary | ICD-10-CM | POA: Diagnosis not present

## 2023-03-02 DIAGNOSIS — Z720 Tobacco use: Secondary | ICD-10-CM

## 2023-03-02 DIAGNOSIS — E279 Disorder of adrenal gland, unspecified: Secondary | ICD-10-CM | POA: Diagnosis not present

## 2023-03-02 DIAGNOSIS — E538 Deficiency of other specified B group vitamins: Secondary | ICD-10-CM

## 2023-03-02 DIAGNOSIS — R55 Syncope and collapse: Secondary | ICD-10-CM

## 2023-03-02 MED ORDER — CARVEDILOL 12.5 MG PO TABS: 12.5000 mg | ORAL_TABLET | Freq: Two times a day (BID) | ORAL | 1 refills | Status: DC

## 2023-03-02 MED ORDER — APIXABAN 5 MG PO TABS: 5.0000 mg | ORAL_TABLET | Freq: Two times a day (BID) | ORAL | 3 refills | Status: DC

## 2023-03-02 NOTE — Telephone Encounter (Signed)
 Called pharmacy to informed them that PCP wanted Coreg 25 mg to be canceled. Penni Bombard CMA

## 2023-03-02 NOTE — Assessment & Plan Note (Signed)
 Discussed at length.  Discussed reducing cigarettes to reduce risk of rupture.  He is has clearly excellent hypertension control.

## 2023-03-02 NOTE — Patient Instructions (Addendum)
 It was wonderful to see you today.  Please bring ALL of your medications with you to every visit.   Today we talked about:  If you pass out again, please go to the Emergency Room  Please reduce your carvedilol  to 12.5 mg BID--I will message Dr. Rolan to see if he has a different preference  I sent in you blood thinner and gave you 7 days of pills  Please call with cost   Please follow up in 2 weeks to check blood pressure   Thank you for choosing [facility].   Please call [phone] with any questions about today's appointment.  Please be sure to schedule follow up at the front  desk before you leave today.   Suzann Daring, MD  Family Medicine

## 2023-03-02 NOTE — Assessment & Plan Note (Signed)
 Refilled apixaban  he will message with the cost.  I also gave him another weeks worth of samples.  It is imperative that the patient continue to take his Eliquis  prior to his ablation please give him another Eliquis  sample if he calls that he has run out platelet.

## 2023-03-02 NOTE — Assessment & Plan Note (Signed)
Repeat hepatic function today.  ?

## 2023-03-02 NOTE — Assessment & Plan Note (Signed)
 Congratulated on cutting back--discussed cessation

## 2023-03-02 NOTE — Assessment & Plan Note (Signed)
 He took his dexamethasone last night.  DHEA-S renin aldosterone level and a.m. cortisol today.  MRI is ordered and scheduled.

## 2023-03-02 NOTE — Assessment & Plan Note (Signed)
 Repeat at next visit.

## 2023-03-07 ENCOUNTER — Telehealth (HOSPITAL_COMMUNITY): Payer: Self-pay | Admitting: *Deleted

## 2023-03-07 NOTE — Telephone Encounter (Signed)
Reaching out to patient to offer assistance regarding upcoming cardiac imaging study; pt verbalizes understanding of appt date/time, parking situation and where to check in, pre-test NPO status, and verified current allergies; name and call back number provided for further questions should they arise  Larey Brick RN Navigator Cardiac Imaging [facility] [phone] office [phone] cell  Patient aware to arrive at 7:30 AM.

## 2023-03-08 ENCOUNTER — Ambulatory Visit (HOSPITAL_COMMUNITY): Payer: Medicare Other

## 2023-03-08 ENCOUNTER — Ambulatory Visit (HOSPITAL_COMMUNITY)
Admission: RE | Admit: 2023-03-08 | Discharge: 2023-03-08 | Disposition: A | Discharge status: Home / Self Care | Payer: Medicare (Managed Care) | Source: Ambulatory Visit | Attending: Cardiology | Admitting: Cardiology

## 2023-03-08 DIAGNOSIS — I4819 Other persistent atrial fibrillation: Secondary | ICD-10-CM | POA: Insufficient documentation

## 2023-03-08 MED ORDER — IOHEXOL 350 MG/ML SOLN
95.0000 mL | Freq: Once | INTRAVENOUS | Status: AC | PRN
  Administered 2023-03-08: 95 mL via INTRAVENOUS

## 2023-03-09 ENCOUNTER — Ambulatory Visit (HOSPITAL_COMMUNITY): Payer: Medicare (Managed Care) | Attending: Family Medicine

## 2023-03-13 LAB — HEPATIC FUNCTION PANEL
ALT: 31 [IU]/L (ref 0–44)
AST: 15 [IU]/L (ref 0–40)
Albumin: 4.2 g/dL (ref 3.9–4.9)
Alkaline Phosphatase: 167 [IU]/L — ABNORMAL HIGH (ref 44–121)
Bilirubin Total: 0.5 mg/dL (ref 0.0–1.2)
Bilirubin, Direct: 0.19 mg/dL (ref 0.00–0.40)
Total Protein: 6.9 g/dL (ref 6.0–8.5)

## 2023-03-13 LAB — ALDOSTERONE + RENIN ACTIVITY W/ RATIO
Aldos/Renin Ratio: 0.2 (ref 0.0–30.0)
Aldosterone: 3.9 ng/dL (ref 0.0–30.0)
Renin Activity, Plasma: 17.912 ng/mL/h — ABNORMAL HIGH (ref 0.167–5.380)

## 2023-03-13 LAB — DHEA-SULFATE: DHEA-SO4: 30.3 ug/dL — ABNORMAL LOW (ref 30.9–295.6)

## 2023-03-13 LAB — CORTISOL-AM, BLOOD: Cortisol - AM: 2 ug/dL — ABNORMAL LOW (ref 6.2–19.4)

## 2023-03-14 ENCOUNTER — Telehealth: Payer: Self-pay | Admitting: Family Medicine

## 2023-03-14 DIAGNOSIS — D3500 Benign neoplasm of unspecified adrenal gland: Secondary | ICD-10-CM

## 2023-03-14 NOTE — Telephone Encounter (Signed)
Attempted to call patient. Reached voicemail, left generic voicemail to call back. If he calls back please ask what times work best for him to be contacted   Red team- can you please schedule MRI ordered during visit?  Terisa Starr, MD  Family Medicine Teaching Service

## 2023-03-15 NOTE — Telephone Encounter (Signed)
Called and discussed. Renin and aldo are acceptable (is on ARB, so would be high) but AM cortistol after suppression test JUST at cut off by my read. Recommend seeing Endocrinology. Discussed and referred.

## 2023-03-15 NOTE — Addendum Note (Signed)
Addended by: Manson Passey, Rosabel Sermeno on: 03/15/2023 09:49 AM   Modules accepted: Orders

## 2023-03-15 NOTE — Telephone Encounter (Signed)
Patient returns calls to nurse line.   Advised PCP would like to discuss test results with.   He reports he will be available until 1pm.  Advised to keep his phone on him.  Best contact number. [phone].  Will forward to PCP.

## 2023-03-15 NOTE — Telephone Encounter (Signed)
Patient informed. Penni Bombard CMA

## 2023-03-22 ENCOUNTER — Ambulatory Visit (HOSPITAL_COMMUNITY)
Admission: RE | Admit: 2023-03-22 | Discharge: 2023-03-22 | Disposition: A | Discharge status: Home / Self Care | Payer: Medicare (Managed Care) | Source: Ambulatory Visit | Attending: Family Medicine | Admitting: Family Medicine

## 2023-03-22 DIAGNOSIS — E279 Disorder of adrenal gland, unspecified: Secondary | ICD-10-CM | POA: Diagnosis present

## 2023-03-22 MED ORDER — GADOBUTROL 1 MMOL/ML IV SOLN
10.0000 mL | Freq: Once | INTRAVENOUS | Status: AC | PRN
  Administered 2023-03-22: 10 mL via INTRAVENOUS

## 2023-03-26 ENCOUNTER — Ambulatory Visit (INDEPENDENT_AMBULATORY_CARE_PROVIDER_SITE_OTHER): Payer: Medicare (Managed Care)

## 2023-03-26 VITALS — Ht 71.0 in | Wt 234.0 lb

## 2023-03-26 DIAGNOSIS — Z0000 Encounter for general adult medical examination without abnormal findings: Secondary | ICD-10-CM

## 2023-03-26 NOTE — Patient Instructions (Addendum)
Mr. Hodes , Thank you for taking time to come for your Medicare Wellness Visit. I appreciate your ongoing commitment to your health goals. Please review the following plan we discussed and let me know if I can assist you in the future.   Referrals/Orders/Follow-Ups/Clinician Recommendations: Yes; Keep maintaining your health by staying independent, physically active and keeping your appointments with Dr. Manson Passey and any specialists that you may see.  Call us if you need anything.  Have a great year!!!!  This is a list of the screening recommended for you and due dates:  Health Maintenance  Topic Date Due   Zoster (Shingles) Vaccine (1 of 2) Never done   DTaP/Tdap/Td vaccine (2 - Td or Tdap) 08/14/2023   Medicare Annual Wellness Visit  03/25/2024   Colon Cancer Screening  10/05/2026   Pneumonia Vaccine  Completed   Flu Shot  Completed   COVID-19 Vaccine  Completed   Hepatitis C Screening  Completed   HPV Vaccine  Aged Out    Advanced directives: (Copy Requested) Please bring a copy of your health care power of attorney and living will to the office to be added to your chart at your convenience.  Next Medicare Annual Wellness Visit scheduled for next year: Yes; It was nice speaking with you today! Your next Annual Wellness Visit is scheduled for 03/27/2024 at 2:20 p.m. via telephone. If you need to reschedule or cancel, please call [phone].

## 2023-03-26 NOTE — Progress Notes (Signed)
Subjective:   Shawn Perkins is a 68 y.o. male who presents for an Initial Medicare Annual Wellness Visit.  Visit Complete: Virtual I connected with  Shawn Perkins on 03/26/23 by a audio enabled telemedicine application and verified that I am speaking with the correct person using two identifiers.  Patient Location: Home  Provider Location: Office/Clinic  I discussed the limitations of evaluation and management by telemedicine. The patient expressed understanding and agreed to proceed.  Vital Signs: Because this visit was a virtual/telehealth visit, some criteria may be missing or patient reported. Any vitals not documented were not able to be obtained and vitals that have been documented are patient reported.  This patient declined Interactive audio and Acupuncturist. Therefore the visit was completed with audio only.  Cardiac Risk Factors include: advanced age (>72men, >86 women);hypertension;obesity (BMI >30kg/m2);smoking/ tobacco exposure     Objective:    Today's Vitals   03/26/23 1421  Weight: 234 lb (106.1 kg)  Height: 5\' 11"  (1.803 m)  PainSc: 6   PainLoc: Back   Body mass index is 32.64 kg/m.     03/26/2023    2:24 PM 12/01/2022    8:58 AM 11/09/2022   12:14 PM 10/05/2022    9:29 AM 09/15/2022    8:33 AM 03/22/2022    8:41 AM 01/10/2021    8:29 AM  Advanced Directives  Does Patient Have a Medical Advance Directive? Yes No No No No No No  Type of Estate agent of [place];Living will        Copy of Healthcare Power of Attorney in Chart? No - copy requested        Would patient like information on creating a medical advance directive?  No - Patient declined  No - Patient declined No - Patient declined No - Patient declined No - Patient declined    Current Medications (verified) Outpatient Encounter Medications as of 03/26/2023  Medication Sig   acetaminophen (TYLENOL) 500 MG tablet Take 1,000 mg by mouth every 6 (six) hours as needed  (BACK PAIN.).   amiodarone (PACERONE) 200 MG tablet TAKE 2 TABLETS BY MOUTH EVERY DAY (Patient taking differently: Take 200 mg by mouth daily.)   apixaban (ELIQUIS) 5 MG TABS tablet Take 1 tablet (5 mg total) by mouth 2 (two) times daily.   atorvastatin (LIPITOR) 40 MG tablet Take 1 tablet (40 mg total) by mouth daily.   Blood Pressure Monitoring (BLOOD PRESSURE CUFF) MISC Please check and monitor blood pressure daily   carvedilol (COREG) 12.5 MG tablet Take 1 tablet (12.5 mg total) by mouth 2 (two) times daily.   dapagliflozin propanediol (FARXIGA) 10 MG TABS tablet Take 1 tablet (10 mg total) by mouth daily before breakfast.   furosemide (LASIX) 40 MG tablet Take 1 tablet (40 mg total) by mouth daily as needed (FOR SWELLING OR WEIGHT GAIN 3 POUNDS OVER NIGHT OR 5 POUNDS IN 1 WEEK).   losartan (COZAAR) 25 MG tablet Take 1 tablet (25 mg total) by mouth daily.   potassium chloride SA (KLOR-CON M) 20 MEQ tablet TAKE 2 BY MOUTH DAILY AS NEEDED (TAKE (2) TABLETS ON DAYS THAT YOU TAKE LASIX (FUROSEMIDE)).   vitamin B-12 (CYANOCOBALAMIN) 1000 MCG tablet Take 1 tablet (1,000 mcg total) by mouth daily.   No facility-administered encounter medications on file as of 03/26/2023.    Allergies (verified) Penicillins   History: Past Medical History:  Diagnosis Date   CHF (congestive heart failure) (HCC)  Diverticulosis    Heart murmur    History of cocaine abuse (HCC)    use to smoke crack cocaine, last use was more than 6 years ago   Hypertension    Rheumatic fever    as a child   Systolic heart failure    a. 11/09/10 echo EF 35%, b. 11/11/10 cath norm cors c. ECHO (11/2012) EF 35-50%   Umbilical hernia, incarcerated 11/2012   11/2012 exploratory lap with small bwl resection   Past Surgical History:  Procedure Laterality Date   BOWEL RESECTION  12/05/2012   Procedure: SMALL BOWEL RESECTION;  Surgeon: Axel Filler, MD;  Location: [facility] OR;  Service: General;;   CARDIOVERSION N/A 11/09/2022    Procedure: CARDIOVERSION;  Surgeon: Laurey Morale, MD;  Location: [facility] INVASIVE CV LAB;  Service: Cardiovascular;  Laterality: N/A;   CARDIOVERSION N/A 12/01/2022   Procedure: CARDIOVERSION;  Surgeon: Laurey Morale, MD;  Location: [facility] INVASIVE CV LAB;  Service: Cardiovascular;  Laterality: N/A;   SMALL INTESTINE SURGERY  12/05/2012   TONSILLECTOMY     UMBILICAL HERNIA REPAIR     2014   Family History  Problem Relation Age of Onset   Kidney failure Mother    Healthy Father    Heart disease Neg Hx    Diabetes Neg Hx    Hyperlipidemia Neg Hx    COPD Neg Hx    Colon cancer Neg Hx    Rectal cancer Neg Hx    Stomach cancer Neg Hx    Social History   Socioeconomic History   Marital status: Single    Spouse name: Not on file   Number of children: Not on file   Years of education: Not on file   Highest education level: Not on file  Occupational History   Not on file  Tobacco Use   Smoking status: Some Days    Current packs/day: 0.30    Average packs/day: 0.1 packs/day for 43.1 years (5.4 ttl pk-yrs)    Types: Cigarettes    Start date: 02/21/1980   Smokeless tobacco: Never  Substance and Sexual Activity   Alcohol use: Yes    Alcohol/week: 4.0 standard drinks of alcohol    Types: 4 Cans of beer per week    Comment: 12/05/2012 "no more than 12 pack beer/week; haven't had anything in 3 wks"   Drug use: Yes    Types: Marijuana    Comment: 12/05/2012 used to smoke weed & crack cocaine; none in over 6 years ago"   Sexual activity: Not Currently  Other Topics Concern   Not on file  Social History Narrative   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), Saturdays at apartment complex    Social Drivers of Health   Financial Resource Strain: Low Risk  (03/26/2023)   Overall Financial Resource Strain (CARDIA)    Difficulty of Paying Living Expenses: Not hard at all  Food Insecurity: No Food Insecurity (03/26/2023)   Hunger Vital Sign    Worried About Running Out of Food in the  Last Year: Never true    Ran Out of Food in the Last Year: Never true  Transportation Needs: No Transportation Needs (03/26/2023)   PRAPARE - Administrator, Civil Service (Medical): No    Lack of Transportation (Non-Medical): No  Physical Activity: Sufficiently Active (03/26/2023)   Exercise Vital Sign    Days of Exercise per Week: 7 days    Minutes of Exercise per Session: 60 min  Stress: No  Stress Concern Present (03/26/2023)   [employer] of Occupational Health - Occupational Stress Questionnaire    Feeling of Stress : Not at all  Social Connections: Moderately Integrated (03/26/2023)   Social Connection and Isolation Panel [NHANES]    Frequency of Communication with Friends and Family: More than three times a week    Frequency of Social Gatherings with Friends and Family: More than three times a week    Attends Religious Services: More than 4 times per year    Active Member of [employer] or Organizations: Yes    Attends Engineer, structural: More than 4 times per year    Marital Status: Never married    Tobacco Counseling Ready to quit: Not Answered Counseling given: Not Answered   Clinical Intake:  Pre-visit preparation completed: Yes  Pain : 0-10 Pain Score: 6  Pain Type: Chronic pain Pain Location: Back Pain Orientation: Lower     BMI - recorded: 32.64 Nutritional Risks: None Diabetes: No  How often do you need to have someone help you when you read instructions, pamphlets, or other written materials from your doctor or pharmacy?: 1 - Never What is the last grade level you completed in school?: HSG  Interpreter Needed?: No  Information entered by :: Susie Cassette, LPN.   Activities of Daily Living    03/26/2023    2:28 PM 12/01/2022    8:56 AM  In your present state of health, do you have any difficulty performing the following activities:  Hearing? 0 0  Vision? 0 0  Difficulty concentrating or making decisions? 0 0  Walking or  climbing stairs? 0 0  Dressing or bathing? 0 0  Doing errands, shopping? 0   Preparing Food and eating ? N   Using the Toilet? N   In the past six months, have you accidently leaked urine? N   Do you have problems with loss of bowel control? N   Managing your Medications? N   Managing your Finances? N   Housekeeping or managing your Housekeeping? N     Patient Care Team: Westley Chandler, MD as PCP - General (Family Medicine) Nobie Putnam, MD as PCP - Electrophysiology (Cardiology)  Indicate any recent Medical Services you may have received from other than [facility] providers in the past year (date may be approximate).     Assessment:   This is a routine wellness examination for [place].  Hearing/Vision screen Hearing Screening - Comments:: Patient denied any hearing difficulty.   No hearing aids.  Vision Screening - Comments:: Patient does wear otc readers.  Not up to date with eye exam.    Goals Addressed             This Visit's Progress    Client understands the importance of follow-up with providers by attending scheduled visits.        Depression Screen    03/26/2023    2:27 PM 11/27/2022    8:26 AM 10/05/2022    9:29 AM 09/15/2022    8:33 AM 09/13/2022   10:25 AM 03/22/2022    8:41 AM 07/05/2018    8:27 AM  PHQ 2/9 Scores  PHQ - 2 Score 0 0 0 0 0 0 0  PHQ- 9 Score 0 0 0 0 1 0     Fall Risk    03/26/2023    2:25 PM 11/27/2022    8:26 AM 10/05/2022    9:29 AM 09/15/2022    8:33 AM 09/08/2022  8:33 AM  Fall Risk   Falls in the past year? 1 0 0 0 0  Number falls in past yr: 0 0 0 0 0  Injury with Fall? 0  0 0 0  Follow up Falls evaluation completed;Education provided        MEDICARE RISK AT HOME: Medicare Risk at Home Any stairs in or around the home?: No If so, are there any without handrails?: No Home free of loose throw rugs in walkways, pet beds, electrical cords, etc?: Yes Adequate lighting in your home to reduce risk of falls?: Yes Life alert?: No Use  of a cane, walker or w/c?: No Grab bars in the bathroom?: Yes Shower chair or bench in shower?: No Elevated toilet seat or a handicapped toilet?: No  TIMED UP AND GO:  Was the test performed? No    Cognitive Function:    03/26/2023    2:27 PM  MMSE - Mini Mental State Exam  Not completed: Unable to complete        03/26/2023    2:27 PM  6CIT Screen  What Year? 0 points  What month? 0 points  What time? 0 points  Count back from 20 0 points  Months in reverse 0 points  Repeat phrase 0 points  Total Score 0 points    Immunizations Immunization History  Administered Date(s) Administered   Fluad Quad(high Dose 65+) 01/10/2021, 03/22/2022   Fluad Trivalent(High Dose 65+) 11/27/2022   Influenza,inj,Quad PF,6+ Mos 11/23/2012   PFIZER Comirnaty(Gray Top)Covid-19 Tri-Sucrose Vaccine 05/24/2020, 06/14/2020   PNEUMOCOCCAL CONJUGATE-20 01/10/2021   Pfizer Covid-19 Vaccine Bivalent Booster 21yrs & up 01/10/2021   Pfizer(Comirnaty)Fall Seasonal Vaccine 12 years and older 11/27/2022   Pneumococcal Polysaccharide-23 11/23/2012   Tdap 08/13/2013    TDAP status: Up to date  Flu Vaccine status: Up to date  Pneumococcal vaccine status: Up to date  Covid-19 vaccine status: Completed vaccines  Qualifies for Shingles Vaccine? Yes   Zostavax completed No   Shingrix Completed?: No.    Education has been provided regarding the importance of this vaccine. Patient has been advised to call insurance company to determine out of pocket expense if they have not yet received this vaccine. Advised may also receive vaccine at local pharmacy or Health Dept. Verbalized acceptance and understanding.  Screening Tests Health Maintenance  Topic Date Due   Zoster Vaccines- Shingrix (1 of 2) Never done   DTaP/Tdap/Td (2 - Td or Tdap) 08/14/2023   Medicare Annual Wellness (AWV)  03/25/2024   Colonoscopy  10/05/2026   Pneumonia Vaccine 68+ Years old  Completed   INFLUENZA VACCINE  Completed    COVID-19 Vaccine  Completed   Hepatitis C Screening  Completed   HPV VACCINES  Aged Out    Health Maintenance  Health Maintenance Due  Topic Date Due   Zoster Vaccines- Shingrix (1 of 2) Never done    Colorectal cancer screening: Type of screening: Colonoscopy. Completed 10/04/2016. Repeat every 10 years  Lung Cancer Screening: (Low Dose CT Chest recommended if Age 58-80 years, 20 pack-year currently smoking OR have quit w/in 15years.) does not qualify.   Lung Cancer Screening Referral: no  Additional Screening:  Hepatitis C Screening: does qualify; Completed 09/13/2022  Vision Screening: Recommended annual ophthalmology exams for early detection of glaucoma and other disorders of the eye. Is the patient up to date with their annual eye exam?  No  Who is the provider or what is the name of the office in which the  patient attends annual eye exams? None If pt is not established with a provider, would they like to be referred to a provider to establish care? No.   Dental Screening: Recommended annual dental exams for proper oral hygiene   Community Resource Referral / Chronic Care Management: CRR required this visit?  No   CCM required this visit?  No    Plan:     I have personally reviewed and noted the following in the patient's chart:   Medical and social history Use of alcohol, tobacco or illicit drugs  Current medications and supplements including opioid prescriptions. Patient is not currently taking opioid prescriptions. Functional ability and status Nutritional status Physical activity Advanced directives List of other physicians Hospitalizations, surgeries, and ER visits in previous 12 months Vitals Screenings to include cognitive, depression, and falls Referrals and appointments  In addition, I have reviewed and discussed with patient certain preventive protocols, quality metrics, and best practice recommendations. A written personalized care plan for  preventive services as well as general preventive health recommendations were provided to patient.     Mickeal Needy, LPN   05/27/9627   After Visit Summary: (Declined) Due to this being a telephonic visit, with patients personalized plan was offered to patient but patient Declined AVS at this time   Nurse Notes: Patient is due for Shingrix Vaccine.

## 2023-03-27 NOTE — Pre-Procedure Instructions (Signed)
 Attempted to call patient regarding procedure instructions.  Left voicemail on the following items: Arrival time 0515 Nothing to eat or drink after midnight No meds AM of procedure Responsible person to drive you home and stay with you for 24 hrs  Have you missed any doses of anti-coagulant Elqiuis- should be taken twice a day, if you have missed any doses please let us  know.  Don't take dose on Thursday morning.

## 2023-03-28 ENCOUNTER — Telehealth: Payer: Self-pay | Admitting: Cardiology

## 2023-03-28 ENCOUNTER — Telehealth: Payer: Self-pay | Admitting: Family Medicine

## 2023-03-28 ENCOUNTER — Encounter: Payer: Self-pay | Admitting: Medical Oncology

## 2023-03-28 ENCOUNTER — Telehealth: Payer: Self-pay

## 2023-03-28 DIAGNOSIS — E278 Other specified disorders of adrenal gland: Secondary | ICD-10-CM

## 2023-03-28 NOTE — Telephone Encounter (Signed)
 Spoke with the patient about his ablation that is scheduled for tomorrow. Advised that Dr. Kennyth reviewed his recent MRI that was ordered by primary care and due to findings on the MRI we will need to cancel his ablation. He will need to be worked up on those findings before we can reschedule. Patient has not heard from his PCP about the MRI results yet. I advised him to give them a call. Patient verbalized understanding.

## 2023-03-28 NOTE — Telephone Encounter (Signed)
 Patient calling in with questions concerning his ablation. Please advise

## 2023-03-28 NOTE — Telephone Encounter (Signed)
 Patient calls nurse line in regards to MRI results.   He reports Cardiology cancelled his procedure due to MRI findings. (See notes)   He reports they will not move forward with procedure until further work is done by PCP.   Advised will forward to PCP to review.   Will call patient with any updates.

## 2023-03-28 NOTE — Telephone Encounter (Signed)
 See separate phone note.

## 2023-03-28 NOTE — Telephone Encounter (Signed)
 Called patient discussed MRI findings. Already referred to Endocrinology. Discussed next steps, will refer to Oncology for recommendations. Urgent referral sent. Called provider line, left voicemail. All questions answered, will follow up with patient 2/6  Suzann Daring, MD  [facility] Medicine Teaching Service

## 2023-03-29 ENCOUNTER — Telehealth: Payer: Self-pay | Admitting: Family Medicine

## 2023-03-29 ENCOUNTER — Encounter (HOSPITAL_COMMUNITY): Admission: RE | Payer: Medicare (Managed Care) | Source: Home / Self Care

## 2023-03-29 ENCOUNTER — Ambulatory Visit (HOSPITAL_COMMUNITY): Admission: RE | Admit: 2023-03-29 | Payer: Medicare (Managed Care) | Source: Home / Self Care | Admitting: Cardiology

## 2023-03-29 SURGERY — ATRIAL FIBRILLATION ABLATION
Anesthesia: General

## 2023-03-29 NOTE — Telephone Encounter (Signed)
 Called and confirmed upcoming oncology visit.

## 2023-03-30 ENCOUNTER — Other Ambulatory Visit: Payer: Self-pay

## 2023-03-30 MED ORDER — CARVEDILOL 12.5 MG PO TABS: 12.5000 mg | ORAL_TABLET | Freq: Two times a day (BID) | ORAL | 1 refills | Status: DC

## 2023-03-31 ENCOUNTER — Other Ambulatory Visit (HOSPITAL_COMMUNITY): Payer: Self-pay | Admitting: Family Medicine

## 2023-04-02 ENCOUNTER — Encounter: Payer: Self-pay | Admitting: Medical Oncology

## 2023-04-02 NOTE — Progress Notes (Signed)
Rapid Diagnostic Clinic [facility] [phone]   [phone]  INITIAL CONSULTATION:  Patient Care Team: Westley Chandler, MD as PCP - General (Family Medicine) Nobie Putnam, MD as PCP - Electrophysiology (Cardiology)  CHIEF COMPLAINTS/PURPOSE OF CONSULTATION:  Left adrenal mass  HISTORY OF PRESENTING ILLNESS:  Shawn Perkins 68 y.o. male with medical history significant for CHF, hypertension, atrial fibrillation, AAA, chronic tobacco use, history of alcohol use presents to the rapid diagnostic clinic for evaluation of left adrenal mass  On review of the previous records, Shawn Perkins underwent CT lung cancer screening on 12/29/2022. Incidental finding included indeterminate adrenal mass, new from 02/15/2012. He underwent MRI imaging of the abdomen on 03/22/2023. Findings revealed enhancing mass of the left adrenal gland measuring 3.2 x 2.9 cm.  On exam today, Shawn Perkins reports he is doing well without any new or concerning symptoms. He does have chronic low back pain secondary to lumbar spinal stenosis and narrowing. He has received steroid injections which has improved his back pain. He reports no change to his appetite or energy. He is able to complete his ADLs on his own. He denies nausea, vomiting or bowel habit changes. He denies easy bruising or signs of bleeding. He denies fevers, chills, sweats, shortness of breath, chest pain or cough. He has no other complaints. Rest of the ROS is below.   MEDICAL HISTORY:  Past Medical History:  Diagnosis Date   CHF (congestive heart failure) (HCC)    Diverticulosis    Heart murmur    History of cocaine abuse (HCC)    use to smoke crack cocaine, last use was more than 6 years ago   Hypertension    Rheumatic fever    as a child   Systolic heart failure    a. 11/09/10 echo EF 35%, b. 11/11/10 cath norm cors c. ECHO (11/2012) EF 35-50%   Umbilical hernia, incarcerated 11/2012   11/2012 exploratory lap with  small bwl resection    SURGICAL HISTORY: Past Surgical History:  Procedure Laterality Date   BOWEL RESECTION  12/05/2012   Procedure: SMALL BOWEL RESECTION;  Surgeon: Axel Filler, MD;  Location: [facility] OR;  Service: General;;   CARDIOVERSION N/A 11/09/2022   Procedure: CARDIOVERSION;  Surgeon: Laurey Morale, MD;  Location: [facility] INVASIVE CV LAB;  Service: Cardiovascular;  Laterality: N/A;   CARDIOVERSION N/A 12/01/2022   Procedure: CARDIOVERSION;  Surgeon: Laurey Morale, MD;  Location: [facility] INVASIVE CV LAB;  Service: Cardiovascular;  Laterality: N/A;   SMALL INTESTINE SURGERY  12/05/2012   TONSILLECTOMY     UMBILICAL HERNIA REPAIR     2014    SOCIAL HISTORY: Social History   Socioeconomic History   Marital status: Single    Spouse name: Not on file   Number of children: Not on file   Years of education: Not on file   Highest education level: Not on file  Occupational History   Not on file  Tobacco Use   Smoking status: Some Days    Current packs/day: 0.30    Average packs/day: 0.1 packs/day for 43.1 years (5.4 ttl pk-yrs)    Types: Cigarettes    Start date: 02/21/1980   Smokeless tobacco: Never  Substance and Sexual Activity   Alcohol use: Yes    Alcohol/week: 4.0 standard drinks of alcohol    Types: 4 Cans of beer per week    Comment: 12/05/2012 "no more than 12 pack beer/week; haven't had anything in 3 wks"  Drug use: Yes    Types: Marijuana    Comment: 12/05/2012 used to smoke weed & crack cocaine; none in over 6 years ago"   Sexual activity: Not Currently  Other Topics Concern   Not on file  Social History Narrative   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), Saturdays at apartment complex    Social Drivers of Health   Financial Resource Strain: Low Risk  (03/26/2023)   Overall Financial Resource Strain (CARDIA)    Difficulty of Paying Living Expenses: Not hard at all  Food Insecurity: No Food Insecurity (03/26/2023)   Hunger Vital Sign    Worried About  Running Out of Food in the Last Year: Never true    Ran Out of Food in the Last Year: Never true  Transportation Needs: No Transportation Needs (03/26/2023)   PRAPARE - Administrator, Civil Service (Medical): No    Lack of Transportation (Non-Medical): No  Physical Activity: Sufficiently Active (03/26/2023)   Exercise Vital Sign    Days of Exercise per Week: 7 days    Minutes of Exercise per Session: 60 min  Stress: No Stress Concern Present (03/26/2023)   [employer] of Occupational Health - Occupational Stress Questionnaire    Feeling of Stress : Not at all  Social Connections: Moderately Integrated (03/26/2023)   Social Connection and Isolation Panel [NHANES]    Frequency of Communication with Friends and Family: More than three times a week    Frequency of Social Gatherings with Friends and Family: More than three times a week    Attends Religious Services: More than 4 times per year    Active Member of [employer] or Organizations: Yes    Attends Engineer, structural: More than 4 times per year    Marital Status: Never married  Intimate Partner Violence: Not At Risk (03/26/2023)   Humiliation, Afraid, Rape, and Kick questionnaire    Fear of Current or Ex-Partner: No    Emotionally Abused: No    Physically Abused: No    Sexually Abused: No    FAMILY HISTORY: Family History  Problem Relation Age of Onset   Kidney failure Mother    Healthy Father    Heart disease Neg Hx    Diabetes Neg Hx    Hyperlipidemia Neg Hx    COPD Neg Hx    Colon cancer Neg Hx    Rectal cancer Neg Hx    Stomach cancer Neg Hx     ALLERGIES:  is allergic to penicillins.  MEDICATIONS:  Current Outpatient Medications  Medication Sig Dispense Refill   acetaminophen (TYLENOL) 500 MG tablet Take 1,000 mg by mouth every 6 (six) hours as needed (BACK PAIN.).     amiodarone (PACERONE) 200 MG tablet TAKE 2 TABLETS BY MOUTH EVERY DAY (Patient taking differently: Take 200 mg by mouth daily.)  180 tablet 1   apixaban (ELIQUIS) 5 MG TABS tablet Take 1 tablet (5 mg total) by mouth 2 (two) times daily. 180 tablet 3   atorvastatin (LIPITOR) 40 MG tablet Take 1 tablet (40 mg total) by mouth daily. 90 tablet 3   Blood Pressure Monitoring (BLOOD PRESSURE CUFF) MISC Please check and monitor blood pressure daily 1 each 0   carvedilol (COREG) 12.5 MG tablet Take 1 tablet (12.5 mg total) by mouth 2 (two) times daily. 60 tablet 1   dapagliflozin propanediol (FARXIGA) 10 MG TABS tablet Take 1 tablet (10 mg total) by mouth daily before breakfast. 30 tablet 5  furosemide (LASIX) 40 MG tablet Take 1 tablet (40 mg total) by mouth daily as needed (FOR SWELLING OR WEIGHT GAIN 3 POUNDS OVER NIGHT OR 5 POUNDS IN 1 WEEK). 90 tablet 1   losartan (COZAAR) 25 MG tablet Take 1 tablet (25 mg total) by mouth daily. 90 tablet 3   potassium chloride SA (KLOR-CON M) 20 MEQ tablet TAKE 2 BY MOUTH DAILY AS NEEDED (TAKE (2) TABLETS ON DAYS THAT YOU TAKE LASIX (FUROSEMIDE)). 180 tablet 1   vitamin B-12 (CYANOCOBALAMIN) 1000 MCG tablet Take 1 tablet (1,000 mcg total) by mouth daily. 90 tablet 3   No current facility-administered medications for this visit.    REVIEW OF SYSTEMS:   Constitutional: ( - ) fevers, ( - )  chills , ( - ) night sweats Eyes: ( - ) blurriness of vision, ( - ) double vision, ( - ) watery eyes Ears, nose, mouth, throat, and face: ( - ) mucositis, ( - ) sore throat Respiratory: ( - ) cough, ( - ) dyspnea, ( - ) wheezes Cardiovascular: ( - ) palpitation, ( - ) chest discomfort, ( - ) lower extremity swelling Gastrointestinal:  ( - ) nausea, ( - ) heartburn, ( - ) change in bowel habits Skin: ( - ) abnormal skin rashes Lymphatics: ( - ) new lymphadenopathy, ( - ) easy bruising Neurological: ( - ) numbness, ( - ) tingling, ( - ) new weaknesses Behavioral/Psych: ( - ) mood change, ( - ) new changes  All other systems were reviewed with the patient and are negative.  PHYSICAL  EXAMINATION: ECOG PERFORMANCE STATUS: 0 - Asymptomatic  There were no vitals filed for this visit. There were no vitals filed for this visit.  GENERAL: well appearing male in NAD  SKIN: skin color, texture, turgor are normal, no rashes or significant lesions EYES: conjunctiva are pink and non-injected, sclera clear  LUNGS: clear to auscultation and percussion with normal breathing effort HEART: regular rate & rhythm and no murmurs and no lower extremity edema ABDOMEN: soft, non-tender, non-distended, normal bowel sounds. Umbilical hernia appreciated, no evidence of incarceration.  Musculoskeletal: no cyanosis of digits and no clubbing  PSYCH: alert & oriented x 3, fluent speech NEURO: no focal motor/sensory deficits  LABORATORY DATA:  I have reviewed the data as listed    Latest Ref Rng & Units 11/01/2022   12:32 PM 10/18/2022   10:25 AM 09/27/2022    9:22 AM  CBC  WBC 3.4 - 10.8 x10E3/uL 5.0  6.0  5.1   Hemoglobin 13.0 - 17.7 g/dL 16.1  09.6  04.5   Hematocrit 37.5 - 51.0 % 41.4  48.0  41.8   Platelets 150 - 450 x10E3/uL 154  192  150        Latest Ref Rng & Units 03/02/2023   10:04 AM 01/22/2023    8:35 AM 01/08/2023    9:12 AM  CMP  Glucose 70 - 99 mg/dL  409  88   BUN 8 - 23 mg/dL  16  12   Creatinine 8.11 - 1.24 mg/dL  9.14  7.82   Sodium 956 - 145 mmol/L  139  139   Potassium 3.5 - 5.1 mmol/L  4.0  4.4   Chloride 98 - 111 mmol/L  109  109   CO2 22 - 32 mmol/L  23  22   Calcium 8.9 - 10.3 mg/dL  9.2  9.6   Total Protein 6.0 - 8.5 g/dL 6.9   7.4  Total Bilirubin 0.0 - 1.2 mg/dL 0.5   1.9   Alkaline Phos 44 - 121 IU/L 167   129   AST 0 - 40 IU/L 15   16   ALT 0 - 44 IU/L 31   22      RADIOGRAPHIC STUDIES: I have personally reviewed the radiological images as listed and agreed with the findings in the report. MR Abdomen W Wo Contrast Result Date: 03/26/2023 CLINICAL DATA:  Indeterminate left adrenal mass identified by prior CT of the chest EXAM: MRI ABDOMEN WITHOUT  AND WITH CONTRAST TECHNIQUE: Multiplanar multisequence MR imaging of the abdomen was performed both before and after the administration of intravenous contrast. CONTRAST:  10mL GADAVIST GADOBUTROL 1 MMOL/ML IV SOLN COMPARISON:  CT chest, 12/29/2022 FINDINGS: Lower chest: No acute abnormality. Hepatobiliary: No solid liver abnormality is seen. No gallstones, gallbladder wall thickening, or biliary dilatation. Pancreas: Unremarkable. No pancreatic ductal dilatation or surrounding inflammatory changes. Spleen: Normal in size without significant abnormality. Adrenals/Urinary Tract: Normal right adrenal gland. T1 and T2 intermediate, enhancing mass of the left adrenal gland measuring 3.2 x 2.9 cm (series 802, image 60). In and opposed phase imaging is unfortunately limited by significant breath motion artifact, however there is no confidently identified macroscopic fat content within this lesion. Simple, benign renal cortical cysts requiring no further follow-up or characterization. No obvious calculi or hydronephrosis. Stomach/Bowel: Stomach is within normal limits. No evidence of bowel wall thickening, distention, or inflammatory changes. Vascular/Lymphatic: Severe aortic atherosclerosis. No enlarged abdominal lymph nodes. Other: Multi compartment fat containing midline ventral hernia, partially imaged (series 10, image 30). No ascites. Musculoskeletal: No acute or significant osseous findings. IMPRESSION: 1. Enhancing mass of the left adrenal gland measuring 3.2 x 2.9 cm. In and opposed phase imaging is unfortunately limited by significant breath motion artifact, however there is no confidently identified macroscopic fat content within this lesion. This is concerning for malignancy; although lipid poor adenoma is a possibility, differential considerations would include pheochromocytoma as well as adrenal metastasis. Consider PET-CT for metabolic characterization and tissue sampling. 2. Multi compartment fat containing  midline ventral hernia, partially imaged. Aortic Atherosclerosis (ICD10-I70.0). Electronically Signed   By: Jearld Lesch M.D.   On: 03/26/2023 22:17   CT CARDIAC MORPH/PULM VEIN W/CM&W/O CA SCORE Addendum Date: 03/15/2023 ADDENDUM REPORT: 03/15/2023 09:45 EXAM: OVER-READ INTERPRETATION  CT CHEST The following report is an over-read performed by radiologist Dr. Alcide Clever of [facility] on 03/15/2023. This over-read does not include interpretation of cardiac or coronary anatomy or pathology. The coronary calcium score/coronary CTA interpretation by the cardiologist is attached. COMPARISON:  12/29/2022 FINDINGS: Cardiovascular: Atherosclerotic calcifications of the aorta are noted. Aneurysmal dilatation to 4 cm in the ascending aorta is noted Mediastinum/Nodes: There are no enlarged lymph nodes within the visualized mediastinum. Lungs/Pleura: There is no pleural effusion. Mild emphysematous changes are noted. Upper abdomen: No significant findings in the visualized upper abdomen. Musculoskeletal/Chest wall: No chest wall mass or suspicious osseous findings within the visualized chest. IMPRESSION: Mild aneurysmal dilatation of the ascending aorta to 4 cm. Recommend annual imaging followup by CTA or MRA. This recommendation follows 2010 ACCF/AHA/AATS/ACR/ASA/SCA/SCAI/SIR/STS/SVM Guidelines for the Diagnosis and Management of Patients with Thoracic Aortic Disease. Circulation. 2010; 121: Z610-R604. Aortic aneurysm NOS (ICD10-I71.9) Aortic Atherosclerosis (ICD10-I70.0) and Emphysema (ICD10-J43.9). Electronically Signed   By: Alcide Clever M.D.   On: 03/15/2023 09:45   Result Date: 03/15/2023 CLINICAL DATA:  Pre Ablation EXAM: Cardiac Gated CTA TECHNIQUE: The patient was scanned on a Siemens  Force 192 slice scanner. Gantry rotation speed was 250 msec with a temporal resolution of 66 msec. A prospective scan was triggered in the ascending thoracic aorta at 140 HU's Data sets were reconstructed with full mA  between 35% and 75% of the R-R interval Images were reviewed using VRT, MIP and MPR modes. Double oblique images were used to measure the PV diameter and areas. The patient received 80 cc of contrast at 5 cc/sec CONTRAST:  Isovue 370 total 80 cc COMPARISON:  None Available. FINDINGS: Moderate bi atrial enlargement. No PFO/ASD. No pericardial effusion Mildly dilated ascending thoracic aorta 3.8 cm Large wind sock appendage with no thrombus Maximum diameter 26.1 mm, average diameter 24.4 mm and depth 33.2 mm Normal PV anatomy see measurements below LUPV:  Ostium 15.9 mm   area 2.6 cm2 LLPV:   Ostium 20 mm  area 2.4 cm2 Elliptical RUPV:  Ostium 20.5 mm  area 3.3 cm2 RLPV:  Ostium 20.4 mm  area 3.6 cm2 Calcium score 210 isolated to the LAD artery IMPRESSION: 1. Windsock Appendage with no thrombus. Average diameter 24.4 mm most suitable for a 31 mm Watchman FLX device 2.  Normal PV anatomy see measurements above 3.  Moderate bi atrial enlargement 4.  Mild ascending thoracic aorta 3.8 cm 5.  No ASD/PFO 6.  No pericardial effusion 7. Calcium score 201 isolated to LAD This is 63 rd percentile for age/sex Electronically Signed: By: Charlton Haws M.D. On: 03/08/2023 09:40    ASSESSMENT: Shawn Perkins is a 68 y.o. male who presents to the rapid diagnostic clinic for evaluation of left adrenal mass.   #Left adrenal mass: --12/29/2022: CT lung cancer screening showed incidental finding included indeterminate adrenal mass, new from 02/15/2012.  --03/22/2023: MRI showed enhancing mass of the left adrenal gland measuring 3.2 x 2.9 cm. PLAN: --Reviewed differentials including metastatic lesion, benign adenoma, and pheochromocytoma.  --Recommend labs today to evaluate CBC, CMP, LDH and 24 hr urine metanephrine levels.  --PET scan scheduled for tomorrow to further evaluate adrenal mass and rule out other sites of metastatic disease.  --Need biopsy pending PET scan tomorrow. Will reach out to IR to review for percutaneous  biopsy.  --RTC once workup is complete.    No orders of the defined types were placed in this encounter.   All questions were answered. The patient knows to call the clinic with any problems, questions or concerns.  I have spent a total of 60 minutes minutes of face-to-face and non-face-to-face time, preparing to see the patient, obtaining and/or reviewing separately obtained history, performing a medically appropriate examination, counseling and educating the patient, ordering medications/tests/procedures, referring and communicating with other health care professionals, documenting clinical information in the electronic health record, independently interpreting results and communicating results to the patient, and care coordination.   Georga Kaufmann, PA-C Department of Hematology/Oncology [facility] at [facility] Phone: [phone]  Patient was seen with Dr. Leonides Schanz.

## 2023-04-02 NOTE — Progress Notes (Signed)
 Rapid Diagnostic Clinic  Outgoing call to patient to offer a sooner clinic appointment. Patient is currently scheduled for 2/12 @ 2 PM with Wyline Hearing PA-C. Patient states that he has another appointment that day and also relies on his daughter for transportation and she has taken time off from work tor the 12th. I gave patient my understanding and confirmed the 12th @ 2PM for appointment, patient knows to arrive 15 minutes early for registration. Patient thanked and encouraged to call with questions in the meantime.   Annella Barrows, RN, BSN, [facility] Oncology Nurse Navigator, Rapid Diagnostic Clinic 04/02/2023 11:21 AM

## 2023-04-03 ENCOUNTER — Institutional Professional Consult (permissible substitution): Payer: Medicare (Managed Care) | Admitting: Surgical

## 2023-04-03 VITALS — BP 136/83 | HR 66 | Resp 18 | Ht 71.0 in | Wt 230.0 lb

## 2023-04-03 DIAGNOSIS — I7121 Aneurysm of the ascending aorta, without rupture: Secondary | ICD-10-CM | POA: Diagnosis not present

## 2023-04-03 NOTE — Progress Notes (Signed)
[address]       Shawn Perkins [id number]             [phone]        Shawn Perkins [id number] 09-Nov-1955   History of Present Illness: Patient is a 68 year old male seen in CT surgical consultation for 4.1 cm ascending aortic aneurysm found on CT scan.  He is asymptomatic in this regard.  Does have a significant cardiac history including congestive heart failure and atrial fibrillation.  He has a history of long-term tobacco use but has decreased significantly to approximately 4 cigarettes/week.  He has a remote history of alcohol and drug abuse.  He is noted to also have a recent finding of a lesion on the adrenal gland which is being worked up by oncology.  He had reportedly rheumatic fever as a child.  Echocardiogram report is as described below.  He does have a tricuspid aortic valve with no significant stenosis or insufficiency.  He is followed by the advanced heart failure team and says he was supposed to get a cardiac catheterization but this has been put on hold as the adrenal mass is being evaluated.  Currently he denies chest pain or shortness of breath.  He denies lower extremity edema.  He is not currently having difficulty with palpitations.  He does fatigue fairly easily and he has significant issues with arthritis and lower back pain for which she gets injections.   Patient Active Problem List   Diagnosis Date Noted   Lesion of adrenal gland (HCC) 03/02/2023   Aneurysm of ascending aorta without rupture (HCC) 03/02/2023   Advance care planning 11/27/2022   Obstructive lung disease (generalized) (HCC) Moderate also with restriction 11/06/2022   Snores 09/27/2022   Elevated bilirubin 09/15/2022   Atrial fibrillation (HCC) 09/08/2022   Chronic left-sided low back pain without sciatica 09/08/2022   Vitamin B12 deficiency 03/22/2022   Essential hypertension 12/06/2012   Chronic systolic heart failure (HCC) 11/21/2010   Tobacco abuse 11/21/2010     Past  Medical History:  Diagnosis Date   CHF (congestive heart failure) (HCC)    Diverticulosis    Heart murmur    History of cocaine abuse (HCC)    use to smoke crack cocaine, last use was more than 6 years ago   Hypertension    Rheumatic fever    as a child   Systolic heart failure    a. 11/09/10 echo EF 35%, b. 11/11/10 cath norm cors c. ECHO (11/2012) EF 35-50%   Umbilical hernia, incarcerated 11/2012   11/2012 exploratory lap with small bwl resection    Past Surgical History:  Procedure Laterality Date   BOWEL RESECTION  12/05/2012   Procedure: SMALL BOWEL RESECTION;  Surgeon: Axel Filler, MD;  Location: [facility] OR;  Service: General;;   CARDIOVERSION N/A 11/09/2022   Procedure: CARDIOVERSION;  Surgeon: Laurey Morale, MD;  Location: [facility] INVASIVE CV LAB;  Service: Cardiovascular;  Laterality: N/A;   CARDIOVERSION N/A 12/01/2022   Procedure: CARDIOVERSION;  Surgeon: Laurey Morale, MD;  Location: [facility] INVASIVE CV LAB;  Service: Cardiovascular;  Laterality: N/A;   SMALL INTESTINE SURGERY  12/05/2012   TONSILLECTOMY     UMBILICAL HERNIA REPAIR     2014    Allergies  Allergen Reactions   Penicillins Other (See Comments)    Childhood reaction    Social History   Occupational History   Not on file  Tobacco Use   Smoking status:  Some Days    Current packs/day: 0.30    Average packs/day: 0.1 packs/day for 43.1 years (5.4 ttl pk-yrs)    Types: Cigarettes    Start date: 02/21/1980   Smokeless tobacco: Never  Substance and Sexual Activity   Alcohol use: Yes    Alcohol/week: 4.0 standard drinks of alcohol    Types: 4 Cans of beer per week    Comment: 12/05/2012 "no more than 12 pack beer/week; haven't had anything in 3 wks"   Drug use: Yes    Types: Marijuana    Comment: 12/05/2012 used to smoke weed & crack cocaine; none in over 6 years ago"   Sexual activity: Not Currently     Current Outpatient Medications on File Prior to Visit  Medication Sig Dispense Refill    acetaminophen (TYLENOL) 500 MG tablet Take 1,000 mg by mouth every 6 (six) hours as needed (BACK PAIN.).     amiodarone (PACERONE) 200 MG tablet TAKE 2 TABLETS BY MOUTH EVERY DAY (Patient taking differently: Take 200 mg by mouth daily.) 180 tablet 1   apixaban (ELIQUIS) 5 MG TABS tablet Take 1 tablet (5 mg total) by mouth 2 (two) times daily. 180 tablet 3   atorvastatin (LIPITOR) 40 MG tablet Take 1 tablet (40 mg total) by mouth daily. 90 tablet 3   Blood Pressure Monitoring (BLOOD PRESSURE CUFF) MISC Please check and monitor blood pressure daily 1 each 0   carvedilol (COREG) 12.5 MG tablet Take 1 tablet (12.5 mg total) by mouth 2 (two) times daily. 60 tablet 1   dapagliflozin propanediol (FARXIGA) 10 MG TABS tablet Take 1 tablet (10 mg total) by mouth daily before breakfast. 30 tablet 5   furosemide (LASIX) 40 MG tablet Take 1 tablet (40 mg total) by mouth daily as needed (FOR SWELLING OR WEIGHT GAIN 3 POUNDS OVER NIGHT OR 5 POUNDS IN 1 WEEK). 90 tablet 1   KLOR-CON M20 20 MEQ tablet TAKE 1 TABLET (20 MEQ TOTAL) BY MOUTH DAILY AS NEEDED ON THE DAYS THAT YOU TAKE LASIX (FUROSEMIDE) 90 tablet 1   losartan (COZAAR) 25 MG tablet Take 1 tablet (25 mg total) by mouth daily. 90 tablet 3   vitamin B-12 (CYANOCOBALAMIN) 1000 MCG tablet Take 1 tablet (1,000 mcg total) by mouth daily. 90 tablet 3   No current facility-administered medications on file prior to visit.     BP 136/83 (BP Location: Left Arm)   Pulse 66   Resp 18   Ht 5\' 11"  (1.803 m)   Wt 230 lb (104.3 kg)   SpO2 95% Comment: RA  BMI 32.08 kg/m   Physical Exam Constitutional:      General: He is not in acute distress. HENT:     Head: Normocephalic and atraumatic.  Eyes:     General: No scleral icterus.    Pupils: Pupils are equal, round, and reactive to light.     Comments: Arcus senilis  Neck:     Vascular: No carotid bruit.  Cardiovascular:     Rate and Rhythm: Normal rate and regular rhythm.     Pulses: Normal pulses.      Heart sounds: No murmur heard. Pulmonary:     Effort: Pulmonary effort is normal. No respiratory distress.     Comments: Some basilar crackles Abdominal:     Palpations: Abdomen is soft.     Tenderness: There is no abdominal tenderness.  Musculoskeletal:     Right lower leg: No edema.     Left lower leg:  No edema.  Lymphadenopathy:     Cervical: No cervical adenopathy.  Skin:    General: Skin is warm and dry.     Capillary Refill: Capillary refill takes less than 2 seconds.     Coloration: Skin is not jaundiced.  Neurological:     Mental Status: He is alert and oriented to person, place, and time.  Psychiatric:        Thought Content: Thought content normal.     CTA Results:  Narrative & Impression  CLINICAL DATA:  Current 20+ pack-year smoker.   EXAM: CT CHEST WITHOUT CONTRAST LOW-DOSE FOR LUNG CANCER SCREENING   TECHNIQUE: Multidetector CT imaging of the chest was performed following the standard protocol without IV contrast.   RADIATION DOSE REDUCTION: This exam was performed according to the departmental dose-optimization program which includes automated exposure control, adjustment of the mA and/or kV according to patient size and/or use of iterative reconstruction technique.   COMPARISON:  11/09/2010 and CT abdomen 02/15/2012.   FINDINGS: Cardiovascular: Atherosclerotic calcification of the aorta, aortic valve and coronary arteries. Enlarged pulmonic trunk and heart. No pericardial effusion. Ascending aorta measures 4.1 cm (6/182).   Mediastinum/Nodes: No pathologically enlarged mediastinal or axillary lymph nodes. Hilar regions are difficult to definitively evaluate without IV contrast. Esophagus is grossly unremarkable.   Lungs/Pleura: Centrilobular and paraseptal emphysema. Smoking related respiratory bronchiolitis. Bibasilar pleuroparenchymal scarring. 3.7 mm right upper lobe nodule. No suspicious pulmonary nodules. No pleural fluid. Airway is  unremarkable.   Upper Abdomen: 3.2 cm left adrenal mass measures 25 Hounsfield units and was not visualized on 02/15/2012. Low-attenuation lesion in the left kidney. No specific follow-up necessary. Midline ventral hernia contains fat, partially imaged. Visualized portions of the liver, adrenal glands, kidneys, spleen, pancreas, stomach and bowel are otherwise grossly unremarkable. No upper abdominal adenopathy.   Musculoskeletal: Degenerative changes in the spine.   IMPRESSION: 1. Lung-RADS 2, benign appearance or behavior. Continue annual screening with low-dose chest CT without contrast in 12 months. 2. Indeterminate right adrenal mass, new from 02/15/2012. Recommend adrenal washout CT or chemical shift MRI. 3. 4.1 cm ascending aortic aneurysm, enlarged from 3.7 cm on 11/09/2010. Recommend annual imaging followup by CTA or MRA. This recommendation follows 2010 ACCF/AHA/AATS/ACR/ASA/SCA/SCAI/SIR/STS/SVM Guidelines for the Diagnosis and Management of Patients with Thoracic Aortic Disease. Circulation. 2010; 121: G295-M841. Aortic aneurysm NOS (ICD10-I71.9). 4. Aortic atherosclerosis (ICD10-I70.0). Coronary artery calcification. 5. Enlarged pulmonic trunk, indicative of pulmonary arterial hypertension. 6.  Emphysema (ICD10-J43.9).     Electronically Signed   By: Leanna Battles M.D.   On: 01/19/2023 13:22           ADDENDUM REPORT: 03/15/2023 09:45   EXAM: OVER-READ INTERPRETATION  CT CHEST   The following report is an over-read performed by radiologist Dr. Alcide Clever of [facility] on 03/15/2023. This over-read does not include interpretation of cardiac or coronary anatomy or pathology. The coronary calcium score/coronary CTA interpretation by the cardiologist is attached.   COMPARISON:  12/29/2022   FINDINGS: Cardiovascular: Atherosclerotic calcifications of the aorta are noted. Aneurysmal dilatation to 4 cm in the ascending aorta is noted    Mediastinum/Nodes: There are no enlarged lymph nodes within the visualized mediastinum.   Lungs/Pleura: There is no pleural effusion. Mild emphysematous changes are noted.   Upper abdomen: No significant findings in the visualized upper abdomen.   Musculoskeletal/Chest wall: No chest wall mass or suspicious osseous findings within the visualized chest.   IMPRESSION: Mild aneurysmal dilatation of the ascending aorta to 4  cm. Recommend annual imaging followup by CTA or MRA. This recommendation follows 2010 ACCF/AHA/AATS/ACR/ASA/SCA/SCAI/SIR/STS/SVM Guidelines for the Diagnosis and Management of Patients with Thoracic Aortic Disease. Circulation. 2010; 121: H086-V784. Aortic aneurysm NOS (ICD10-I71.9)   Aortic Atherosclerosis (ICD10-I70.0) and Emphysema (ICD10-J43.9).     Electronically Signed   By: Alcide Clever M.D.   On: 03/15/2023 09:45       ECHOCARDIOGRAM REPORT       Patient Name:   Shawn Perkins Date of Exam: 01/25/2023  Medical Rec #:  [id number]    Height:       71.0 in  Accession #:    [id number]   Weight:       236.0 lb  Date of Birth:  May 12, 1955    BSA:          2.262 m  Patient Age:    23 years     BP:           130/80 mmHg  Patient Gender: M            HR:           55 bpm.  Exam Location:  Outpatient   Procedure: 2D Echo, Cardiac Doppler and Color Doppler   Indications:    Congestive Heart Failure I50.9    History:        Patient has prior history of Echocardiogram examinations,  most                 recent 09/11/2022. CHF; Signs/Symptoms:Murmur.    Sonographer:    Darlys Gales  Referring Phys: CARINA M BROWN   IMPRESSIONS     1. Left ventricular ejection fraction, by estimation, is 50 to 55%. The  left ventricle has low normal function. Left ventricular endocardial  border not optimally defined to evaluate regional wall motion. The left  ventricular internal cavity size was  mildly dilated. There is mild concentric left ventricular hypertrophy.   Left ventricular diastolic parameters are indeterminate.   2. Right ventricular systolic function is normal. The right ventricular  size is normal.   3. Left atrial size was mildly dilated.   4. The mitral valve is degenerative. Mild mitral valve regurgitation.  Mild mitral stenosis. The mean mitral valve gradient is 4.0 mmHg with  average heart rate of 53 bpm.   5. The aortic valve is tricuspid. There is moderate calcification of the  aortic valve. There is mild thickening of the aortic valve. Aortic valve  regurgitation is mild. Aortic valve sclerosis is present, with no evidence  of aortic valve stenosis.   6. The inferior vena cava is normal in size with greater than 50%  respiratory variability, suggesting right atrial pressure of 3 mmHg.   Comparison(s): Prior images reviewed side by side. Mitral valve  calcification is more prominent. Prior gradient assessment suboptimal.   FINDINGS   Left Ventricle: Left ventricular ejection fraction, by estimation, is 50  to 55%. The left ventricle has low normal function. Left ventricular  endocardial border not optimally defined to evaluate regional wall motion.  The left ventricular internal cavity   size was mildly dilated. There is mild concentric left ventricular  hypertrophy. Left ventricular diastolic parameters are indeterminate.   Right Ventricle: The right ventricular size is normal. No increase in  right ventricular wall thickness. Right ventricular systolic function is  normal.   Left Atrium: Left atrial size was mildly dilated.   Right Atrium: Right atrial size was normal in size.   Pericardium: There  is no evidence of pericardial effusion.   Mitral Valve: 2D MVA planimetry 3.85 cm2. The mitral valve is degenerative  in appearance. Mild mitral valve regurgitation. Mild mitral valve  stenosis. MV peak gradient, 10.5 mmHg. The mean mitral valve gradient is  4.0 mmHg with average heart rate of 53  bpm.   Tricuspid Valve:  The tricuspid valve is normal in structure. Tricuspid  valve regurgitation is mild . No evidence of tricuspid stenosis.   Aortic Valve: The aortic valve is tricuspid. There is moderate  calcification of the aortic valve. There is mild thickening of the aortic  valve. There is mild aortic valve annular calcification. Aortic valve  regurgitation is mild. Aortic regurgitation PHT   measures 630 msec. Aortic valve sclerosis is present, with no evidence of  aortic valve stenosis. Aortic valve mean gradient measures 9.0 mmHg.  Aortic valve peak gradient measures 14.9 mmHg.   Pulmonic Valve: The pulmonic valve was normal in structure. Pulmonic valve  regurgitation is trivial. No evidence of pulmonic stenosis.   Aorta: The aortic root is normal in size and structure.   Venous: The inferior vena cava is normal in size with greater than 50%  respiratory variability, suggesting right atrial pressure of 3 mmHg.   IAS/Shunts: The atrial septum is grossly normal.     LEFT VENTRICLE  PLAX 2D  LVIDd:         6.00 cm Diastology  LVIDs:         4.70 cm LV e' medial:    5.11 cm/s  LV PW:         1.10 cm LV E/e' medial:  28.8  LV IVS:        1.10 cm LV e' lateral:   6.42 cm/s                         LV E/e' lateral: 22.9     RIGHT VENTRICLE  RV S prime:     10.10 cm/s  TAPSE (M-mode): 1.9 cm   LEFT ATRIUM           Index        RIGHT ATRIUM           Index  LA Vol (A4C): 81.1 ml 35.85 ml/m  RA Area:     23.50 cm                                     RA Volume:   66.30 ml  29.31 ml/m   AORTIC VALVE  AV Vmax:           193.00 cm/s  AV Vmean:          139.000 cm/s  AV VTI:            0.450 m  AV Peak Grad:      14.9 mmHg  AV Mean Grad:      9.0 mmHg  LVOT Vmax:         91.00 cm/s  LVOT Vmean:        79.600 cm/s  LVOT VTI:          0.226 m  LVOT/AV VTI ratio: 0.50  AI PHT:            630 msec   MITRAL VALVE                TRICUSPID VALVE  MV Area (PHT): 2.23 cm     TR Peak grad:   30.2  mmHg  MV Peak grad:  10.5 mmHg    TR Vmax:        275.00 cm/s  MV Mean grad:  4.0 mmHg  MV Vmax:       1.62 m/s     SHUNTS  MV Vmean:      82.6 cm/s    Systemic VTI: 0.23 m  MV Decel Time: 340 msec  MV E velocity: 147.00 cm/s  MV A velocity: 128.00 cm/s  MV E/A ratio:  1.15   Riley Lam MD  Electronically signed by Riley Lam MD  Signature Date/Time: 01/25/2023/4:49:12 PM        Final     Assessment/plan: 4.1 cm ascending aortic aneurysm which we will monitor with yearly chest CTA for now.     Risk Modification: Discussed  Statin: Lipitor  Smoking cessation instruction/counseling given:  counseled patient on the dangers of tobacco use, advised patient to stop smoking, and reviewed strategies to maximize success  Patient was counseled on importance of Blood Pressure Control.  Despite Medical intervention if the patient notices persistently elevated blood pressure readings.  They are instructed to contact their Primary Care Physician  Please avoid use of Fluoroquinolones as this can potentially increase your risk of Aortic Rupture and/or Dissection  Patient educated on signs and symptoms of Aortic Dissection, handout also provided in AVS  Rowe Clack, PA-C 04/03/23

## 2023-04-03 NOTE — Patient Instructions (Signed)
Medical risk factor modifications as discussed Lifestyle and nutrition modifications as discussed

## 2023-04-04 ENCOUNTER — Encounter: Payer: Self-pay | Admitting: Medical Oncology

## 2023-04-04 ENCOUNTER — Inpatient Hospital Stay: Payer: Medicare (Managed Care) | Attending: Physician Assistant | Admitting: Physician Assistant

## 2023-04-04 ENCOUNTER — Inpatient Hospital Stay: Payer: Medicare (Managed Care)

## 2023-04-04 ENCOUNTER — Encounter: Payer: Self-pay | Admitting: Physician Assistant

## 2023-04-04 VITALS — BP 131/75 | HR 68 | Temp 97.5°F | Resp 16 | Wt 241.7 lb

## 2023-04-04 DIAGNOSIS — I4891 Unspecified atrial fibrillation: Secondary | ICD-10-CM | POA: Insufficient documentation

## 2023-04-04 DIAGNOSIS — E278 Other specified disorders of adrenal gland: Secondary | ICD-10-CM

## 2023-04-04 DIAGNOSIS — Z7901 Long term (current) use of anticoagulants: Secondary | ICD-10-CM | POA: Insufficient documentation

## 2023-04-04 DIAGNOSIS — E279 Disorder of adrenal gland, unspecified: Secondary | ICD-10-CM | POA: Diagnosis not present

## 2023-04-04 DIAGNOSIS — I110 Hypertensive heart disease with heart failure: Secondary | ICD-10-CM | POA: Diagnosis not present

## 2023-04-04 LAB — CMP (CANCER CENTER ONLY)
ALT: 28 U/L (ref 0–44)
AST: 16 U/L (ref 15–41)
Albumin: 4.4 g/dL (ref 3.5–5.0)
Alkaline Phosphatase: 117 U/L (ref 38–126)
Anion gap: 7 (ref 5–15)
BUN: 18 mg/dL (ref 8–23)
CO2: 26 mmol/L (ref 22–32)
Calcium: 9.5 mg/dL (ref 8.9–10.3)
Chloride: 108 mmol/L (ref 98–111)
Creatinine: 1.44 mg/dL — ABNORMAL HIGH (ref 0.61–1.24)
GFR, Estimated: 53 mL/min — ABNORMAL LOW (ref >60)
Glucose, Bld: 78 mg/dL (ref 70–99)
Potassium: 4.3 mmol/L (ref 3.5–5.1)
Sodium: 141 mmol/L (ref 135–145)
Total Bilirubin: 0.7 mg/dL (ref 0.0–1.2)
Total Protein: 7.9 g/dL (ref 6.5–8.1)

## 2023-04-04 LAB — CBC WITH DIFFERENTIAL (CANCER CENTER ONLY)
Abs Immature Granulocytes: 0.02 10*3/uL (ref 0.00–0.07)
Basophils Absolute: 0.1 10*3/uL (ref 0.0–0.1)
Basophils Relative: 1 %
Eosinophils Absolute: 0.2 10*3/uL (ref 0.0–0.5)
Eosinophils Relative: 3 %
HCT: 46.9 % (ref 39.0–52.0)
Hemoglobin: 15.4 g/dL (ref 13.0–17.0)
Immature Granulocytes: 0 %
Lymphocytes Relative: 32 %
Lymphs Abs: 2.5 10*3/uL (ref 0.7–4.0)
MCH: 31 pg (ref 26.0–34.0)
MCHC: 32.8 g/dL (ref 30.0–36.0)
MCV: 94.6 fL (ref 80.0–100.0)
Monocytes Absolute: 0.7 10*3/uL (ref 0.1–1.0)
Monocytes Relative: 9 %
Neutro Abs: 4.2 10*3/uL (ref 1.7–7.7)
Neutrophils Relative %: 55 %
Platelet Count: 175 10*3/uL (ref 150–400)
RBC: 4.96 MIL/uL (ref 4.22–5.81)
RDW: 16.8 % — ABNORMAL HIGH (ref 11.5–15.5)
WBC Count: 7.7 10*3/uL (ref 4.0–10.5)
nRBC: 0 % (ref 0.0–0.2)

## 2023-04-04 LAB — LACTATE DEHYDROGENASE: LDH: 157 U/L (ref 98–192)

## 2023-04-04 NOTE — Patient Instructions (Signed)
Diagnostic Clinic Office Visit Discharge Information and Instructions  Thank you for choosing [facility] for your healthcare needs.  Below is a summary of today's discussion, along with our contact information and an outline of what to expect next.  Reason for Visit:  Left adrenal mass  Proposed Diagnostic Care Plan: Labs today PET/CT scan scheduled for tomorrow, 04/05/2023 at 4:30 pm Biopsy will be scheduled once PET scan is reviewed  What to Expect: - Generally, when lab tests are ordered the results can take up to 1 week for results to be available.  At that point, we will contact you to discuss your results with you.  Unless there is a critical result, we will typically wait for all of your lab results to be available before contacting you. - If a biopsy is part of your Care Plan, those results can take on average 7-10 days to result.  Once results are available, we will contact you to discuss your pathology results and any next steps. - If you have additional imaging ordered, such as a CT Scan, MRI, Ultrasound, Bone Scan, or PET scan, your imaging will need to be authorized then scheduled with the earliest available appointment.  You may be asked to travel to another hospital within [facility] who has a sooner availability, please consider doing so if asked. - If you use MyChart, your results will be available to you in the MyChart portal.  Your provider will be in touch with you as soon as all of your results are available to be discussed.  Your Diagnostic Clinic Provider:  Dr. Jeanie Sewer and Georga Kaufmann PA-C, contact number [phone] Your Diagnostic Navigator:  Chauncy Lean, RN, contact number [phone].  If you or your caregiver have number blocking on your cell phones, please ensure the cancer center's numbers are not blocked.  If you are not a registered MyChart user, please consider enrolling in MyChart to receive your test results and visit notes.  You can also access  your discharge instructions electronically.  MyChart also gives you an electronic means to communicate with your Care Team instead of needing to call in to the cancer center.  We appreciate you trusting [place] with your healthcare and look forward to partnering with you as we work to uncover what your potential diagnosis may be.  Please do not hesitate to reach out at any point with questions or concerns.

## 2023-04-04 NOTE — Progress Notes (Signed)
[facility]  Patient in clinic, with daughter, this afternoon for scheduled appointment with PA-C Georga Kaufmann. I met with patient during appointment and provided him with my direct contact information and encouraged him to call me with questions/concerns.   Rexene Edison, RN, BSN, [facility] Oncology Nurse Navigator, [facility] 04/04/2023 3:42 PM

## 2023-04-05 ENCOUNTER — Telehealth: Payer: Self-pay

## 2023-04-05 ENCOUNTER — Encounter (HOSPITAL_COMMUNITY): Admission: RE | Admit: 2023-04-05 | Payer: Medicare (Managed Care) | Source: Ambulatory Visit

## 2023-04-05 NOTE — Telephone Encounter (Signed)
Pt called and LVM asking if he still needs to bring in urine sample to [facility] as his appts were cancelled. Message forwarded to [place], [place] as well as Orthoptist.

## 2023-04-06 ENCOUNTER — Other Ambulatory Visit: Payer: Self-pay

## 2023-04-06 DIAGNOSIS — E279 Disorder of adrenal gland, unspecified: Secondary | ICD-10-CM | POA: Diagnosis not present

## 2023-04-06 DIAGNOSIS — E278 Other specified disorders of adrenal gland: Secondary | ICD-10-CM

## 2023-04-06 NOTE — Progress Notes (Signed)
Shawn Mor, DO  [name]; P Ir Procedure Requests Requesting provider to follow up urine metanephrines and consider MIBG before biopsy.  We can remove for now. JW       Previous Messages    ----- Message ----- From: [name] Sent: 04/04/2023   4:50 PM EST To: [name]; Ir Procedure Requests Subject: CT Biopsy                                      Procedure : CT Biopsy  Reason: left adrenal mass. Dx: Adrenal mass (HCC) [E27.8 (ICD-10-CM)]    History : MR abdomen w/wo , CT cardiac morph , CT chest lung cancer screening, [place] abd limited , Mr lumbar spine w/o  Provider : Briant Cedar, PA-C  Provider contact: [phone]

## 2023-04-09 ENCOUNTER — Encounter: Payer: Self-pay | Admitting: Medical Oncology

## 2023-04-09 DIAGNOSIS — M47816 Spondylosis without myelopathy or radiculopathy, lumbar region: Secondary | ICD-10-CM | POA: Diagnosis not present

## 2023-04-09 NOTE — Progress Notes (Signed)
[facility]  Outgoing call to patient to inform him that his labs resulted and look good. Also, per Karena Addison (PA-C), we are waiting on this PET and urine studies to result prior to deciding on a biopsy, as recommended by IR. Patient was reminded of his scheduled PET for this Friday the 21st at 11:30 and for him to arrive at 11:00. Patient also reminded to stop eating/drinking 6 hours prior to his scan. Patient was informed ok to drink water only. Patient gave verbal understanding and denies having any questions at this time. Patient thanked and encouraged to call me with questions/concerns.   Rexene Edison, RN, BSN, [facility] Oncology Nurse Navigator, [facility] 04/09/2023 4:24 PM

## 2023-04-11 ENCOUNTER — Encounter: Payer: Self-pay | Admitting: Medical Oncology

## 2023-04-11 LAB — METANEPHRINES, URINE, 24 HOUR
Metaneph Total, Ur: 40 ug/L
Metanephrines, 24H Ur: 58 ug/(24.h) (ref 58–276)
Normetanephrine, 24H Ur: 106 ug/(24.h) — ABNORMAL LOW (ref 156–729)
Normetanephrine, Ur: 73 ug/L
Total Volume: 1450

## 2023-04-13 ENCOUNTER — Other Ambulatory Visit: Payer: Self-pay | Admitting: Physician Assistant

## 2023-04-13 ENCOUNTER — Ambulatory Visit (HOSPITAL_COMMUNITY)
Admission: RE | Admit: 2023-04-13 | Discharge: 2023-04-13 | Disposition: A | Discharge status: Home / Self Care | Payer: Medicare (Managed Care) | Source: Ambulatory Visit | Attending: Physician Assistant | Admitting: Physician Assistant

## 2023-04-13 DIAGNOSIS — E278 Other specified disorders of adrenal gland: Secondary | ICD-10-CM | POA: Diagnosis present

## 2023-04-13 DIAGNOSIS — I700 Atherosclerosis of aorta: Secondary | ICD-10-CM | POA: Diagnosis not present

## 2023-04-13 DIAGNOSIS — I517 Cardiomegaly: Secondary | ICD-10-CM | POA: Insufficient documentation

## 2023-04-13 DIAGNOSIS — J328 Other chronic sinusitis: Secondary | ICD-10-CM | POA: Diagnosis not present

## 2023-04-13 DIAGNOSIS — I2510 Atherosclerotic heart disease of native coronary artery without angina pectoris: Secondary | ICD-10-CM | POA: Diagnosis not present

## 2023-04-13 DIAGNOSIS — K439 Ventral hernia without obstruction or gangrene: Secondary | ICD-10-CM | POA: Diagnosis not present

## 2023-04-13 DIAGNOSIS — K5730 Diverticulosis of large intestine without perforation or abscess without bleeding: Secondary | ICD-10-CM | POA: Insufficient documentation

## 2023-04-13 DIAGNOSIS — I868 Varicose veins of other specified sites: Secondary | ICD-10-CM | POA: Diagnosis not present

## 2023-04-13 DIAGNOSIS — M51379 Other intervertebral disc degeneration, lumbosacral region without mention of lumbar back pain or lower extremity pain: Secondary | ICD-10-CM | POA: Insufficient documentation

## 2023-04-13 LAB — GLUCOSE, CAPILLARY: Glucose-Capillary: 90 mg/dL (ref 70–99)

## 2023-04-13 MED ORDER — FLUDEOXYGLUCOSE F - 18 (FDG) INJECTION
11.9300 | Freq: Once | INTRAVENOUS | Status: AC
  Administered 2023-04-13: 11.93 via INTRAVENOUS

## 2023-04-17 ENCOUNTER — Telehealth: Payer: Self-pay | Admitting: Physician Assistant

## 2023-04-17 NOTE — Telephone Encounter (Signed)
 I called [name] to review the PET scan results and our recommendations. PET scan shows left adrenal mass with mild metabolic activity suggesting benign process. IR review did not recommend biopsy due to PET scan results normal urine metanephrine levels, and difficult location of adrenal mass. Dr. Leonides Perkins recommends surveillance with serial CT imaging in 3 months. I advised [name] to reach out to the clinic if he develops any new or concerning symptoms including abdominal pain.   [name] expressed understanding of the plan provided.

## 2023-04-19 ENCOUNTER — Telehealth: Payer: Self-pay | Admitting: Hematology and Oncology

## 2023-04-19 ENCOUNTER — Other Ambulatory Visit (HOSPITAL_COMMUNITY): Payer: Self-pay | Admitting: Family Medicine

## 2023-04-19 DIAGNOSIS — I5022 Chronic systolic (congestive) heart failure: Secondary | ICD-10-CM

## 2023-04-19 NOTE — Telephone Encounter (Signed)
 Scheduled patient's appts per in basket message received. Advised patient to contact us if rescheduling is needed.

## 2023-05-07 ENCOUNTER — Other Ambulatory Visit: Payer: Self-pay | Admitting: Medical Oncology

## 2023-05-07 DIAGNOSIS — E278 Other specified disorders of adrenal gland: Secondary | ICD-10-CM

## 2023-05-14 NOTE — H&P (View-Only) (Signed)
 Electrophysiology Office Note:   Date:  05/15/2023  ID:  Shawn Perkins, DOB [birth year], MRN [id number]  Primary Cardiologist: None Electrophysiologist: Nobie Putnam, MD      History of Present Illness:   Shawn Perkins is a 68 y.o. male with h/o HTN, tobacco and ETOH use, and chronic systolic heart failure secondary to NICM, and left adrenal mass who is being seen today for follow up evaluation of his atrial fibrillation.   Patient saw his PCP 09/08/22 was complaining of shortness of breath for several weeks.  He was found to be in new atrial fibrillation with onset of volume overload on exam, consistent with acute decompensated heart failure. Advised evaluation in ED, but patient declined.  He was started on Eliquis. Echo 7/24 showed EF 45%, slight drop from normal, possibly tachy-arrhythmia mediate. Attempted DCCV 11/09/22 x 3 but was unsuccessful.  Amiodarone was started and DCCV was repeated on 10/24 with successful conversion to sinus rhythm.   Discussed the use of AI scribe software for clinical note transcription with the patient, who gave verbal consent to proceed.  History of Present Illness After our last visit, patient had been scheduled for ablation on 03/29/23. Procedure was canceled after MRI of his abdomen revealed a left adrenal mass. He has continued to get workup for this, most recently with a PET scan which deemed the mass to be low risk for cancer.  He states there are no current plans for surgery or other procedures related to the kidney mass. No bothersome symptoms from the kidney mass, and it is not causing any problems. He is not aware of any new AF episodes while on amiodarone. He does experiences dizziness when changing positions, or turning his head, describing it as feeling 'like drunk or something.' He manages these symptoms by standing up slowly. No new or acute complaints.   Review of systems complete and found to be negative unless listed in HPI.   EP Information /  Studies Reviewed:    EKG is ordered today. Personal review as below.      EKG from 11/09/22 reviewed which showed coarse atrial fibrillation.       Echo 01/25/23:  Low normal LV function, LVEF 50 to 55%.  Mildly dilated LV.  Mild concentric LVH. Normal RV size and function. Left atrium mildly dilated.  RA normal in size. Mild mitral stenosis, mean gradient . Aortic sclerosis without stenosis.  CT Cardiac 03/08/23: IMPRESSION: 1. Windsock Appendage with no thrombus. Average diameter 24.4 mm most suitable for a 31 mm Watchman FLX device 2.  Normal PV anatomy see measurements above 3.  Moderate bi atrial enlargement 4.  Mild ascending thoracic aorta 3.8 cm 5.  No ASD/PFO 6.  No pericardial effusion 7. Calcium score 201 isolated to LAD This is 63 rd percentile for age/sex  Risk Assessment/Calculations:    CHA2DS2-VASc Score = 3   This indicates a 3.2% annual risk of stroke. The patient's score is based upon: CHF History: 1 HTN History: 1 Diabetes History: 0 Stroke History: 0 Vascular Disease History: 0 Age Score: 1 Gender Score: 0             Physical Exam:   VS:  BP 114/78   Pulse 63   Ht 5\' 11"  (1.803 m)   Wt 246 lb (111.6 kg)   SpO2 96%   BMI 34.31 kg/m    Wt Readings from Last 3 Encounters:  05/15/23 246 lb (111.6 kg)  04/04/23 241 lb 11.2 oz (109.6  kg)  04/03/23 230 lb (104.3 kg)     GEN: Well nourished, well developed in no acute distress NECK: No JVD CARDIAC: Normal rate, regular rhythm. RESPIRATORY:  Clear to auscultation without rales, wheezing or rhonchi  ABDOMEN: Soft, non-tender, non-distended EXTREMITIES:  No edema; No deformity   ASSESSMENT AND PLAN:   #Persistent atrial fibrillation, symptomatic: Likely the etiology of his decompensated heart failure back in July. -We had planned for ablation but left adrenal mass was discovered and ablation has been on hold. He states that he was told mass was likely not cancerous and there are no  plans for surgery. In light of this, we rediscussed treatment options today for AF including antiarrhythmic drug therapy, changing to Tikosyn, and ablation. Discussed risks, recovery and likelihood of success with each treatment strategy. Risk, benefits, and alternatives to EP study and ablation for afib were discussed. These risks include but are not limited to stroke, bleeding, vascular damage, tamponade, perforation, damage to the esophagus, lungs, phrenic nerve and other structures, pulmonary vein stenosis, worsening renal function, coronary vasospasm and death. The patient understands these risk and wishes to proceed.  We will therefore proceed with catheter ablation at the next available time.  Carto, ICE, anesthesia are requested for the procedure.   -Continue amiodarone 200mg  once daily as a bridge to ablation.  -I will communicate with his oncologist regarding plans for ablation to make sure there is no interference with oncologic evaluation/treatment.   #Secondary hypercoagulable state due to atrial fibrillation: -CHA2DS2-VASc score of 3. -Continue Eliquis 5mg  twice daily.   #High risk medication use: Anti-arrhythmic drug therapy - amiodarone.  - LFTs normal 04/04/23.  - TSH normal 12/2022. Will repeat today. - EKG with sinus bradycardia and normal QT interval.  #Left adrenal mass: Workup ongoing.  -Following with oncology, awaiting biopsy.  -PET scan showed low grade metabolic activity. -I will communicate with his oncologist regarding plans for ablation to make sure there is no interference with oncologic evaluation/treatment.   #Chronic systolic heart failure secondary to non-ischemic cardiomyopathy: Well compensated today.  Etiology for his last decompensation was likely atrial fibrillation.  He has done well since maintaining normal rhythm on amiodarone. -Continue GDMT and excellent care with our HF colleagues.    #H/o orthostatic syncope:  -Normotensive today and  euvolemic. -Advised patient to stand up slowly from seated positions, to sit on edge of bed prior to standing after lying flat and fall precautions provided  Follow up with Dr. Jimmey Ralph  3 months after ablation.   Signed, Nobie Putnam, MD

## 2023-05-14 NOTE — Progress Notes (Signed)
 Electrophysiology Office Note:   Date:  05/15/2023  ID:  Shawn Perkins, DOB [birth year], MRN [id number]  Primary Cardiologist: None Electrophysiologist: Nobie Putnam, MD      History of Present Illness:   Shawn Perkins is a 68 y.o. male with h/o HTN, tobacco and ETOH use, and chronic systolic heart failure secondary to NICM, and left adrenal mass who is being seen today for follow up evaluation of his atrial fibrillation.   Patient saw his PCP 09/08/22 was complaining of shortness of breath for several weeks.  He was found to be in new atrial fibrillation with onset of volume overload on exam, consistent with acute decompensated heart failure. Advised evaluation in ED, but patient declined.  He was started on Eliquis. Echo 7/24 showed EF 45%, slight drop from normal, possibly tachy-arrhythmia mediate. Attempted DCCV 11/09/22 x 3 but was unsuccessful.  Amiodarone was started and DCCV was repeated on 10/24 with successful conversion to sinus rhythm.   Discussed the use of AI scribe software for clinical note transcription with the patient, who gave verbal consent to proceed.  History of Present Illness After our last visit, patient had been scheduled for ablation on 03/29/23. Procedure was canceled after MRI of his abdomen revealed a left adrenal mass. He has continued to get workup for this, most recently with a PET scan which deemed the mass to be low risk for cancer.  He states there are no current plans for surgery or other procedures related to the kidney mass. No bothersome symptoms from the kidney mass, and it is not causing any problems. He is not aware of any new AF episodes while on amiodarone. He does experiences dizziness when changing positions, or turning his head, describing it as feeling 'like drunk or something.' He manages these symptoms by standing up slowly. No new or acute complaints.   Review of systems complete and found to be negative unless listed in HPI.   EP Information /  Studies Reviewed:    EKG is ordered today. Personal review as below.      EKG from 11/09/22 reviewed which showed coarse atrial fibrillation.       Echo 01/25/23:  Low normal LV function, LVEF 50 to 55%.  Mildly dilated LV.  Mild concentric LVH. Normal RV size and function. Left atrium mildly dilated.  RA normal in size. Mild mitral stenosis, mean gradient . Aortic sclerosis without stenosis.  CT Cardiac 03/08/23: IMPRESSION: 1. Windsock Appendage with no thrombus. Average diameter 24.4 mm most suitable for a 31 mm Watchman FLX device 2.  Normal PV anatomy see measurements above 3.  Moderate bi atrial enlargement 4.  Mild ascending thoracic aorta 3.8 cm 5.  No ASD/PFO 6.  No pericardial effusion 7. Calcium score 201 isolated to LAD This is 63 rd percentile for age/sex  Risk Assessment/Calculations:    CHA2DS2-VASc Score = 3   This indicates a 3.2% annual risk of stroke. The patient's score is based upon: CHF History: 1 HTN History: 1 Diabetes History: 0 Stroke History: 0 Vascular Disease History: 0 Age Score: 1 Gender Score: 0             Physical Exam:   VS:  BP 114/78   Pulse 63   Ht 5\' 11"  (1.803 m)   Wt 246 lb (111.6 kg)   SpO2 96%   BMI 34.31 kg/m    Wt Readings from Last 3 Encounters:  05/15/23 246 lb (111.6 kg)  04/04/23 241 lb 11.2 oz (109.6  kg)  04/03/23 230 lb (104.3 kg)     GEN: Well nourished, well developed in no acute distress NECK: No JVD CARDIAC: Normal rate, regular rhythm. RESPIRATORY:  Clear to auscultation without rales, wheezing or rhonchi  ABDOMEN: Soft, non-tender, non-distended EXTREMITIES:  No edema; No deformity   ASSESSMENT AND PLAN:   #Persistent atrial fibrillation, symptomatic: Likely the etiology of his decompensated heart failure back in July. -We had planned for ablation but left adrenal mass was discovered and ablation has been on hold. He states that he was told mass was likely not cancerous and there are no  plans for surgery. In light of this, we rediscussed treatment options today for AF including antiarrhythmic drug therapy, changing to Tikosyn, and ablation. Discussed risks, recovery and likelihood of success with each treatment strategy. Risk, benefits, and alternatives to EP study and ablation for afib were discussed. These risks include but are not limited to stroke, bleeding, vascular damage, tamponade, perforation, damage to the esophagus, lungs, phrenic nerve and other structures, pulmonary vein stenosis, worsening renal function, coronary vasospasm and death. The patient understands these risk and wishes to proceed.  We will therefore proceed with catheter ablation at the next available time.  Carto, ICE, anesthesia are requested for the procedure.   -Continue amiodarone 200mg  once daily as a bridge to ablation.  -I will communicate with his oncologist regarding plans for ablation to make sure there is no interference with oncologic evaluation/treatment.   #Secondary hypercoagulable state due to atrial fibrillation: -CHA2DS2-VASc score of 3. -Continue Eliquis 5mg  twice daily.   #High risk medication use: Anti-arrhythmic drug therapy - amiodarone.  - LFTs normal 04/04/23.  - TSH normal 12/2022. Will repeat today. - EKG with sinus bradycardia and normal QT interval.  #Left adrenal mass: Workup ongoing.  -Following with oncology, awaiting biopsy.  -PET scan showed low grade metabolic activity. -I will communicate with his oncologist regarding plans for ablation to make sure there is no interference with oncologic evaluation/treatment.   #Chronic systolic heart failure secondary to non-ischemic cardiomyopathy: Well compensated today.  Etiology for his last decompensation was likely atrial fibrillation.  He has done well since maintaining normal rhythm on amiodarone. -Continue GDMT and excellent care with our HF colleagues.    #H/o orthostatic syncope:  -Normotensive today and  euvolemic. -Advised patient to stand up slowly from seated positions, to sit on edge of bed prior to standing after lying flat and fall precautions provided  Follow up with Dr. Jimmey Ralph  3 months after ablation.   Signed, Nobie Putnam, MD

## 2023-05-15 ENCOUNTER — Other Ambulatory Visit: Payer: Self-pay

## 2023-05-15 ENCOUNTER — Encounter: Payer: Self-pay | Admitting: Cardiology

## 2023-05-15 ENCOUNTER — Ambulatory Visit: Payer: Medicare (Managed Care) | Attending: Cardiology | Admitting: Cardiology

## 2023-05-15 VITALS — BP 114/78 | HR 63 | Ht 71.0 in | Wt 246.0 lb

## 2023-05-15 DIAGNOSIS — Z79899 Other long term (current) drug therapy: Secondary | ICD-10-CM | POA: Diagnosis not present

## 2023-05-15 DIAGNOSIS — E278 Other specified disorders of adrenal gland: Secondary | ICD-10-CM

## 2023-05-15 DIAGNOSIS — D6869 Other thrombophilia: Secondary | ICD-10-CM | POA: Diagnosis not present

## 2023-05-15 DIAGNOSIS — I4819 Other persistent atrial fibrillation: Secondary | ICD-10-CM

## 2023-05-15 DIAGNOSIS — I5022 Chronic systolic (congestive) heart failure: Secondary | ICD-10-CM | POA: Diagnosis not present

## 2023-05-15 MED ORDER — AMIODARONE HCL 200 MG PO TABS: 200.0000 mg | ORAL_TABLET | Freq: Every day | ORAL | Status: DC

## 2023-05-15 NOTE — Patient Instructions (Signed)
 Medication Instructions:  Your physician recommends that you continue on your current medications as directed. Please refer to the Current Medication list given to you today.  *If you need a refill on your cardiac medications before your next appointment, please call your pharmacy*  Lab Work: BMET and CBC  Testing/Procedures: Ablation Your physician has recommended that you have an ablation. Catheter ablation is a medical procedure used to treat some cardiac arrhythmias (irregular heartbeats). During catheter ablation, a long, thin, flexible tube is put into a blood vessel in your groin (upper thigh), or neck. This tube is called an ablation catheter. It is then guided to your heart through the blood vessel. Radio frequency waves destroy small areas of heart tissue where abnormal heartbeats may cause an arrhythmia to start. Please see the instruction sheet given to you today.  Follow-Up: At [facility], you and your health needs are our priority.  As part of our continuing mission to provide you with exceptional heart care, we have created designated Provider Care Teams.  These Care Teams include your primary Cardiologist (physician) and Advanced Practice Providers (APPs -  Physician Assistants and Nurse Practitioners) who all work together to provide you with the care you need, when you need it.  Your next appointment:   We will call you to arrange your post-procedure follow up appointments.

## 2023-05-16 ENCOUNTER — Telehealth (HOSPITAL_COMMUNITY): Payer: Self-pay

## 2023-05-16 ENCOUNTER — Other Ambulatory Visit (HOSPITAL_COMMUNITY): Payer: Self-pay

## 2023-05-16 NOTE — Telephone Encounter (Signed)
 Patient Advocate Encounter  Received notification from [employer] that Dapagliflozin requires prior auth. Review of coverage shows that this plan prefers brand [place]. Test billing returns refill too soon rejection; unable to confirm copay at this time.  No prior authorization submitted at this time.  Burnell Blanks, CPhT Rx Patient Advocate Phone: [phone]

## 2023-05-21 DIAGNOSIS — I480 Paroxysmal atrial fibrillation: Secondary | ICD-10-CM | POA: Diagnosis not present

## 2023-05-22 LAB — CBC
Hematocrit: 44.2 % (ref 37.5–51.0)
Hemoglobin: 14.8 g/dL (ref 13.0–17.7)
MCH: 32 pg (ref 26.6–33.0)
MCHC: 33.5 g/dL (ref 31.5–35.7)
MCV: 96 fL (ref 79–97)
Platelets: 142 10*3/uL — ABNORMAL LOW (ref 150–450)
RBC: 4.62 x10E6/uL (ref 4.14–5.80)
RDW: 16.8 % — ABNORMAL HIGH (ref 11.6–15.4)
WBC: 8.1 10*3/uL (ref 3.4–10.8)

## 2023-05-22 LAB — BASIC METABOLIC PANEL WITH GFR
BUN/Creatinine Ratio: 13 (ref 10–24)
BUN: 17 mg/dL (ref 8–27)
CO2: 21 mmol/L (ref 20–29)
Calcium: 9 mg/dL (ref 8.6–10.2)
Chloride: 109 mmol/L — ABNORMAL HIGH (ref 96–106)
Creatinine, Ser: 1.34 mg/dL — ABNORMAL HIGH (ref 0.76–1.27)
Glucose: 99 mg/dL (ref 70–99)
Potassium: 4.4 mmol/L (ref 3.5–5.2)
Sodium: 143 mmol/L (ref 134–144)
eGFR: 58 mL/min/{1.73_m2} — ABNORMAL LOW (ref >59)

## 2023-06-06 ENCOUNTER — Telehealth (HOSPITAL_COMMUNITY): Payer: Self-pay

## 2023-06-06 NOTE — Telephone Encounter (Signed)
 Call placed to patient to discuss upcoming procedure.   CT: completed.  Labs: completed.   Any recent signs of acute illness or been started on antibiotics? No Any new medications started? No Any medications to hold? Faxiga- last dose 06/09/23 Any missed doses of blood thinner?   Advised patient to continue taking ANTICOAGULANT: Eliquis (Apixaban) without missing any doses.  Medication instructions:  On the morning of your procedure DO NOT take any medication., including Eliquis or the procedure may be rescheduled. Nothing to eat or drink after midnight prior to your procedure.  Confirmed patient is scheduled for Atrial Fibrillation Ablation on Wednesday, April 23 with Dr. Clinton Danas. Instructed patient to arrive at the Main Entrance A at [facility]: [address] [place], [place] [address] and check in at Admitting at 6:30 AM.  Advised of plan to go home the same day and will only stay overnight if medically necessary. You MUST have a responsible adult to drive you home and MUST be with you the first 24 hours after you arrive home or your procedure could be cancelled.  Patient verbalized understanding to all instructions provided and agreed to proceed with procedure.

## 2023-06-11 ENCOUNTER — Encounter: Payer: Self-pay | Admitting: Medical Oncology

## 2023-06-11 NOTE — Progress Notes (Signed)
 [facility]  [facility] with patient regarding scheduled CT scan for May 14th, to arrive at 10:15 at [facility]. Patient provided with Central Scheduling phone number to call and reschedule appointment should this one not work for home. Patient thanked and encouraged to call with questions and concerns.   Esperanza Hedges, RN, BSN, [facility] Oncology Nurse Navigator, [facility] 06/11/2023 2:39 PM

## 2023-06-12 NOTE — Pre-Procedure Instructions (Signed)
 Attempted to call patient regarding procedure instructions.  Left voicemail on the following items: Arrival time new arrival time 0530 Nothing to eat or drink after midnight No meds AM of procedure Responsible person to drive you home and stay with you for 24 hrs  Have you missed any doses of anti-coagulant Eliquis - should be taken twice a day, if you have missed any doses please let us  know.  Don't take dose morning of procedure.

## 2023-06-13 ENCOUNTER — Ambulatory Visit (HOSPITAL_COMMUNITY)
Admission: RE | Disposition: A | Discharge status: Home / Self Care | Payer: Self-pay | Source: Home / Self Care | Attending: Cardiology

## 2023-06-13 ENCOUNTER — Ambulatory Visit (HOSPITAL_COMMUNITY)
Admission: RE | Admit: 2023-06-13 | Discharge: 2023-06-13 | Disposition: A | Discharge status: Home / Self Care | Payer: Medicare (Managed Care) | Attending: Cardiology | Admitting: Cardiology

## 2023-06-13 ENCOUNTER — Other Ambulatory Visit: Payer: Self-pay

## 2023-06-13 ENCOUNTER — Ambulatory Visit (HOSPITAL_BASED_OUTPATIENT_CLINIC_OR_DEPARTMENT_OTHER): Payer: Medicare (Managed Care) | Admitting: Anesthesiology

## 2023-06-13 ENCOUNTER — Ambulatory Visit (HOSPITAL_COMMUNITY): Payer: Medicare (Managed Care) | Admitting: Anesthesiology

## 2023-06-13 DIAGNOSIS — D6869 Other thrombophilia: Secondary | ICD-10-CM | POA: Diagnosis not present

## 2023-06-13 DIAGNOSIS — Z7901 Long term (current) use of anticoagulants: Secondary | ICD-10-CM | POA: Diagnosis not present

## 2023-06-13 DIAGNOSIS — I4819 Other persistent atrial fibrillation: Secondary | ICD-10-CM | POA: Diagnosis not present

## 2023-06-13 DIAGNOSIS — I110 Hypertensive heart disease with heart failure: Secondary | ICD-10-CM | POA: Insufficient documentation

## 2023-06-13 DIAGNOSIS — I5022 Chronic systolic (congestive) heart failure: Secondary | ICD-10-CM | POA: Diagnosis not present

## 2023-06-13 DIAGNOSIS — I428 Other cardiomyopathies: Secondary | ICD-10-CM | POA: Insufficient documentation

## 2023-06-13 DIAGNOSIS — I509 Heart failure, unspecified: Secondary | ICD-10-CM

## 2023-06-13 DIAGNOSIS — E279 Disorder of adrenal gland, unspecified: Secondary | ICD-10-CM | POA: Insufficient documentation

## 2023-06-13 DIAGNOSIS — F17200 Nicotine dependence, unspecified, uncomplicated: Secondary | ICD-10-CM | POA: Insufficient documentation

## 2023-06-13 HISTORY — PX: ATRIAL FIBRILLATION ABLATION: EP1191

## 2023-06-13 LAB — POCT ACTIVATED CLOTTING TIME
Activated Clotting Time: 377 s
Activated Clotting Time: 337 s

## 2023-06-13 SURGERY — ATRIAL FIBRILLATION ABLATION
Anesthesia: General

## 2023-06-13 MED ORDER — HEPARIN (PORCINE) IN NACL 1000-0.9 UT/500ML-% IV SOLN
INTRAVENOUS | Status: DC | PRN
  Administered 2023-06-13 (×3): 500 mL

## 2023-06-13 MED ORDER — AMIODARONE HCL 200 MG PO TABS
200.0000 mg | ORAL_TABLET | Freq: Every day | ORAL | Status: DC
Start: 2023-06-13 — End: 2023-09-11

## 2023-06-13 MED ORDER — PHENYLEPHRINE 80 MCG/ML (10ML) SYRINGE FOR IV PUSH (FOR BLOOD PRESSURE SUPPORT)
PREFILLED_SYRINGE | INTRAVENOUS | Status: DC | PRN
  Administered 2023-06-13: 120 ug via INTRAVENOUS

## 2023-06-13 MED ORDER — PHENYLEPHRINE HCL-NACL 20-0.9 MG/250ML-% IV SOLN
INTRAVENOUS | Status: DC | PRN
  Administered 2023-06-13: 25 ug/min via INTRAVENOUS

## 2023-06-13 MED ORDER — SUGAMMADEX SODIUM 200 MG/2ML IV SOLN
INTRAVENOUS | Status: DC | PRN
  Administered 2023-06-13: 217.8 mg via INTRAVENOUS

## 2023-06-13 MED ORDER — SODIUM CHLORIDE 0.9 % IV SOLN: INTRAVENOUS | Status: DC

## 2023-06-13 MED ORDER — SODIUM CHLORIDE 0.9 % IV SOLN: 250.0000 mL | INTRAVENOUS | Status: DC | PRN

## 2023-06-13 MED ORDER — PROTAMINE SULFATE 10 MG/ML IV SOLN
INTRAVENOUS | Status: DC | PRN
  Administered 2023-06-13: 15 mg via INTRAVENOUS
  Administered 2023-06-13 (×2): 10 mg via INTRAVENOUS

## 2023-06-13 MED ORDER — ROCURONIUM BROMIDE 10 MG/ML (PF) SYRINGE
PREFILLED_SYRINGE | INTRAVENOUS | Status: DC | PRN
  Administered 2023-06-13: 70 mg via INTRAVENOUS
  Administered 2023-06-13: 30 mg via INTRAVENOUS

## 2023-06-13 MED ORDER — PROPOFOL 1000 MG/100ML IV EMUL
INTRAVENOUS | Status: AC
  Filled 2023-06-13: qty 100

## 2023-06-13 MED ORDER — MIDAZOLAM HCL 2 MG/2ML IJ SOLN
INTRAMUSCULAR | Status: DC | PRN
  Administered 2023-06-13 (×2): 1 mg via INTRAVENOUS

## 2023-06-13 MED ORDER — ACETAMINOPHEN 325 MG PO TABS
650.0000 mg | ORAL_TABLET | ORAL | Status: DC | PRN
  Administered 2023-06-13: 650 mg via ORAL
  Filled 2023-06-13: qty 2

## 2023-06-13 MED ORDER — ATROPINE SULFATE 0.4 MG/ML IV SOLN
INTRAVENOUS | Status: DC | PRN
Start: 2023-06-13 — End: 2023-06-13
  Administered 2023-06-13: 1 mg via INTRAVENOUS

## 2023-06-13 MED ORDER — DEXAMETHASONE SODIUM PHOSPHATE 10 MG/ML IJ SOLN
INTRAMUSCULAR | Status: DC | PRN
  Administered 2023-06-13: 10 mg via INTRAVENOUS

## 2023-06-13 MED ORDER — PROPOFOL 10 MG/ML IV BOLUS
INTRAVENOUS | Status: DC | PRN
  Administered 2023-06-13: 140 mg via INTRAVENOUS

## 2023-06-13 MED ORDER — ONDANSETRON HCL 4 MG/2ML IJ SOLN: 4.0000 mg | Freq: Four times a day (QID) | INTRAMUSCULAR | Status: DC | PRN

## 2023-06-13 MED ORDER — HEPARIN SODIUM (PORCINE) 1000 UNIT/ML IJ SOLN
INTRAMUSCULAR | Status: DC | PRN
  Administered 2023-06-13: 15000 [IU] via INTRAVENOUS

## 2023-06-13 MED ORDER — SODIUM CHLORIDE 0.9% FLUSH: 3.0000 mL | INTRAVENOUS | Status: DC | PRN

## 2023-06-13 MED ORDER — MIDAZOLAM HCL 2 MG/2ML IJ SOLN
INTRAMUSCULAR | Status: AC
  Filled 2023-06-13: qty 2

## 2023-06-13 MED ORDER — FENTANYL CITRATE (PF) 250 MCG/5ML IJ SOLN
INTRAMUSCULAR | Status: DC | PRN
  Administered 2023-06-13: 100 ug via INTRAVENOUS

## 2023-06-13 MED ORDER — SODIUM CHLORIDE 0.9% FLUSH: 3.0000 mL | Freq: Two times a day (BID) | INTRAVENOUS | Status: DC

## 2023-06-13 MED ORDER — FENTANYL CITRATE (PF) 100 MCG/2ML IJ SOLN
INTRAMUSCULAR | Status: AC
  Filled 2023-06-13: qty 2

## 2023-06-13 MED ORDER — PROPOFOL 500 MG/50ML IV EMUL
INTRAVENOUS | Status: DC | PRN
  Administered 2023-06-13: 50 mg via INTRAVENOUS
  Administered 2023-06-13: 125 ug/kg/min via INTRAVENOUS
  Administered 2023-06-13: 20 mg via INTRAVENOUS
  Administered 2023-06-13: 125 ug/kg/min via INTRAVENOUS

## 2023-06-13 MED ORDER — LIDOCAINE 2% (20 MG/ML) 5 ML SYRINGE
INTRAMUSCULAR | Status: DC | PRN
  Administered 2023-06-13: 60 mg via INTRAVENOUS

## 2023-06-13 MED ORDER — ONDANSETRON HCL 4 MG/2ML IJ SOLN
INTRAMUSCULAR | Status: DC | PRN
  Administered 2023-06-13: 4 mg via INTRAVENOUS

## 2023-06-13 SURGICAL SUPPLY — 20 items
BAG SNAP BAND KOVER 36X36 (MISCELLANEOUS) IMPLANT
CABLE PFA RX CATH CONN (CABLE) IMPLANT
CATH BI DIR 7FR CS F-J 12 PIN (CATHETERS) IMPLANT
CATH FARAWAVE ABLATION 31 (CATHETERS) IMPLANT
CATH GE 8FR SOUNDSTAR (CATHETERS) IMPLANT
CATH OCTARAY 2.0 F 3-3-3-3-3 (CATHETERS) IMPLANT
CLOSURE PERCLOSE PROSTYLE (VASCULAR PRODUCTS) IMPLANT
COVER SWIFTLINK CONNECTOR (BAG) ×1 IMPLANT
DEVICE CLOSURE MYNXGRIP 6/7F (Vascular Products) IMPLANT
DILATOR VESSEL 38 20CM 16FR (INTRODUCER) IMPLANT
GUIDEWIRE INQWIRE 1.5J.035X260 (WIRE) IMPLANT
KIT VERSACROSS CNCT FARADRIVE (KITS) IMPLANT
MAT PREVALON FULL STRYKER (MISCELLANEOUS) IMPLANT
PACK EP LF (CUSTOM PROCEDURE TRAY) ×1 IMPLANT
PAD DEFIB RADIO PHYSIO CONN (PAD) ×1 IMPLANT
PATCH CARTO3 (PAD) IMPLANT
SHEATH FARADRIVE STEERABLE (SHEATH) IMPLANT
SHEATH PINNACLE 8F 10CM (SHEATH) IMPLANT
SHEATH PINNACLE 9F 10CM (SHEATH) IMPLANT
SHEATH PROBE COVER 6X72 (BAG) IMPLANT

## 2023-06-13 NOTE — Transfer of Care (Signed)
 Immediate Anesthesia Transfer of Care Note  Patient: Shawn Perkins  Procedure(s) Performed: ATRIAL FIBRILLATION ABLATION  Patient Location: PACU and Cath Lab  Anesthesia Type:General  Level of Consciousness: drowsy  Airway & Oxygen Therapy: Patient connected to face mask oxygen  Post-op Assessment: Post -op Vital signs reviewed and stable  Post vital signs: stable  Last Vitals:  Vitals Value Taken Time  BP 106/80 06/13/23 1015  Temp    Pulse 63 06/13/23 1017  Resp 15 06/13/23 1017  SpO2 95 % 06/13/23 1017  Vitals shown include unfiled device data.  Last Pain:  Vitals:   06/13/23 0620  TempSrc: Oral  PainSc: 0-No pain         Complications: There were no known notable events for this encounter.

## 2023-06-13 NOTE — Interval H&P Note (Signed)
 History and Physical Interval Note:  06/13/2023 7:22 AM  Shawn Perkins  has presented today for surgery, with the diagnosis of symptomatic atrial fibrillation.  The various methods of treatment have been discussed with the patient and family. After consideration of risks, benefits and other options for treatment, the patient has consented to  Procedure(s): ATRIAL FIBRILLATION ABLATION (N/A) as a surgical intervention.  The patient's history has been reviewed, patient examined, no change in status, stable for surgery.  I have reviewed the patient's chart and labs.  Questions were answered to the patient's satisfaction.     Ardeen Kohler

## 2023-06-13 NOTE — Discharge Instructions (Addendum)

## 2023-06-13 NOTE — Anesthesia Preprocedure Evaluation (Addendum)
 Anesthesia Evaluation  Patient identified by MRN, date of birth, ID band Patient awake    Reviewed: Allergy & Precautions, NPO status , Patient's Chart, lab work & pertinent test results  History of Anesthesia Complications Negative for: history of anesthetic complications  Airway Mallampati: IV  TM Distance: <3 FB Neck ROM: Full    Dental  (+) Dental Advisory Given, Poor Dentition   Pulmonary neg shortness of breath, neg COPD, neg recent URI, Current Smoker and Patient abstained from smoking.   breath sounds clear to auscultation       Cardiovascular hypertension, Pt. on medications and Pt. on home beta blockers (-) angina +CHF  (-) Past MI + dysrhythmias Atrial Fibrillation + Valvular Problems/Murmurs  Rhythm:Irregular     Neuro/Psych negative neurological ROS  negative psych ROS   GI/Hepatic negative GI ROS, Neg liver ROS,,,  Endo/Other  negative endocrine ROS    Renal/GU negative Renal ROS     Musculoskeletal negative musculoskeletal ROS (+)    Abdominal   Peds  Hematology  (+) Blood dyscrasia eliquis    Anesthesia Other Findings   Reproductive/Obstetrics                             Anesthesia Physical Anesthesia Plan  ASA: 3  Anesthesia Plan: General   Post-op Pain Management: Minimal or no pain anticipated   Induction: Intravenous  PONV Risk Score and Plan: 1 and Ondansetron , Dexamethasone , Propofol  infusion and TIVA  Airway Management Planned: Oral ETT  Additional Equipment: None  Intra-op Plan:   Post-operative Plan: Extubation in OR  Informed Consent: I have reviewed the patients History and Physical, chart, labs and discussed the procedure including the risks, benefits and alternatives for the proposed anesthesia with the patient or authorized representative who has indicated his/her understanding and acceptance.     Dental advisory given  Plan Discussed with:  CRNA  Anesthesia Plan Comments:        Anesthesia Quick Evaluation

## 2023-06-13 NOTE — Anesthesia Postprocedure Evaluation (Signed)
 Anesthesia Post Note  Patient: Shawn Perkins  Procedure(s) Performed: ATRIAL FIBRILLATION ABLATION     Patient location during evaluation: Cath Lab Anesthesia Type: General Level of consciousness: awake and alert Pain management: pain level controlled Vital Signs Assessment: post-procedure vital signs reviewed and stable Respiratory status: spontaneous breathing, nonlabored ventilation and respiratory function stable Cardiovascular status: blood pressure returned to baseline and stable Postop Assessment: no apparent nausea or vomiting Anesthetic complications: no   There were no known notable events for this encounter.  Last Vitals:  Vitals:   06/13/23 1130 06/13/23 1145  BP: (!) 148/90 (!) 150/93  Pulse: (!) 57   Resp: 19 20  Temp:    SpO2: 94%     Last Pain:  Vitals:   06/13/23 1116  TempSrc:   PainSc: 6                  Hani Campusano

## 2023-06-13 NOTE — Anesthesia Procedure Notes (Signed)
 Procedure Name: Intubation Date/Time: 06/13/2023 7:54 AM  Performed by: Claybon Cuna, CRNAPre-anesthesia Checklist: Patient identified, Emergency Drugs available, Suction available and Patient being monitored Patient Re-evaluated:Patient Re-evaluated prior to induction Oxygen Delivery Method: Circle System Utilized Preoxygenation: Pre-oxygenation with 100% oxygen Induction Type: IV induction Ventilation: Mask ventilation without difficulty Laryngoscope Size: Mac and 3 Grade View: Grade III Tube type: Oral Tube size: 7.5 mm Number of attempts: 2 Airway Equipment and Method: Stylet and Oral airway Placement Confirmation: ETT inserted through vocal cords under direct vision, positive ETCO2 and breath sounds checked- equal and bilateral Secured at: 21 cm Tube secured with: Tape Dental Injury: Teeth and Oropharynx as per pre-operative assessment  Difficulty Due To: Difficult Airway- due to dentition Comments: Dr. Brain Cahill placed ett on second attempt

## 2023-06-14 ENCOUNTER — Encounter (HOSPITAL_COMMUNITY): Payer: Self-pay | Admitting: Cardiology

## 2023-06-14 ENCOUNTER — Telehealth (HOSPITAL_COMMUNITY): Payer: Self-pay

## 2023-06-14 MED FILL — Propofol IV Emul 1000 MG/100ML (10 MG/ML): INTRAVENOUS | Qty: 133.4 | Status: AC

## 2023-06-14 MED FILL — Fentanyl Citrate Preservative Free (PF) Inj 100 MCG/2ML: INTRAMUSCULAR | Qty: 2 | Status: AC

## 2023-06-14 NOTE — Telephone Encounter (Signed)
 Spoke with patient to complete post procedure follow up call.  Patient reports no complications with groin sites.   Instructions reviewed with patient:  Remove large bandage at puncture site after 24 hours. It is normal to have bruising, tenderness and a pea or marble sized lump/knot at the groin site which can take up to three months to resolve.  Get help right away if you notice sudden swelling at the puncture site.  Check your puncture site every day for signs of infection: fever, redness, swelling, pus drainage, warmth, foul odor or excessive pain. If this occurs, please call the office at [phone], to speak with the nurse. Get help right away if your puncture site is bleeding and the bleeding does not stop after applying firm pressure to the area.  You may continue to have skipped beats/ atrial fibrillation during the first several months after your procedure.  It is very important not to miss any doses of your blood thinner Eliquis . Patient restarted taking this medication on yesterday,    You will follow up with the Afib clinic on 07/12/23 and the APP on 09/13/23.   Patient verbalized understanding to all instructions provided.

## 2023-06-14 NOTE — Telephone Encounter (Signed)
 Attempted to reach patient to follow up with procedure completed on 4/23, no answer. Left VM for patient to return call.

## 2023-07-04 ENCOUNTER — Ambulatory Visit (HOSPITAL_COMMUNITY)
Admission: RE | Admit: 2023-07-04 | Discharge: 2023-07-04 | Disposition: A | Discharge status: Home / Self Care | Payer: Medicare (Managed Care) | Source: Ambulatory Visit | Attending: Physician Assistant | Admitting: Physician Assistant

## 2023-07-04 DIAGNOSIS — N400 Enlarged prostate without lower urinary tract symptoms: Secondary | ICD-10-CM | POA: Diagnosis not present

## 2023-07-04 DIAGNOSIS — E278 Other specified disorders of adrenal gland: Secondary | ICD-10-CM

## 2023-07-04 DIAGNOSIS — K5730 Diverticulosis of large intestine without perforation or abscess without bleeding: Secondary | ICD-10-CM | POA: Diagnosis not present

## 2023-07-04 DIAGNOSIS — E279 Disorder of adrenal gland, unspecified: Secondary | ICD-10-CM | POA: Diagnosis not present

## 2023-07-04 DIAGNOSIS — K439 Ventral hernia without obstruction or gangrene: Secondary | ICD-10-CM | POA: Insufficient documentation

## 2023-07-04 MED ORDER — SODIUM CHLORIDE (PF) 0.9 % IJ SOLN
INTRAMUSCULAR | Status: AC
  Filled 2023-07-04: qty 50

## 2023-07-04 MED ORDER — IOHEXOL 300 MG/ML  SOLN
100.0000 mL | Freq: Once | INTRAMUSCULAR | Status: AC | PRN
  Administered 2023-07-04: 100 mL via INTRAVENOUS

## 2023-07-11 ENCOUNTER — Telehealth: Payer: Self-pay

## 2023-07-11 ENCOUNTER — Encounter: Payer: Self-pay | Admitting: Endocrinology

## 2023-07-11 ENCOUNTER — Ambulatory Visit: Payer: Medicare (Managed Care) | Admitting: Endocrinology

## 2023-07-11 VITALS — BP 126/70 | HR 65 | Resp 20 | Ht 71.0 in | Wt 254.4 lb

## 2023-07-11 DIAGNOSIS — E279 Disorder of adrenal gland, unspecified: Secondary | ICD-10-CM | POA: Diagnosis not present

## 2023-07-11 DIAGNOSIS — E278 Other specified disorders of adrenal gland: Secondary | ICD-10-CM | POA: Diagnosis not present

## 2023-07-11 MED ORDER — DEXAMETHASONE 1 MG PO TABS: ORAL_TABLET | ORAL | 0 refills | Status: DC

## 2023-07-11 MED ORDER — CARVEDILOL 12.5 MG PO TABS: 12.5000 mg | ORAL_TABLET | Freq: Two times a day (BID) | ORAL | 3 refills | Status: DC

## 2023-07-11 NOTE — Telephone Encounter (Signed)
Rx to pharmacy. Shawn Flett, MD  Family Medicine Teaching Service   

## 2023-07-11 NOTE — Telephone Encounter (Signed)
 Patient calls nurse line in regards to Carvedilol .   He reports he is requesting a #90 day supply.   Will forward to PCP.

## 2023-07-11 NOTE — Progress Notes (Signed)
 Outpatient Endocrinology Note [place] Jesson Foskey, MD   Patient's Name: Shawn Perkins    DOB: Feb 06, 1956    MRN: [id number]  REASON OF VISIT: New consult for an adrenal incidentaloma /left adrenal mass.  REFERRING PROVIDER: Azell Boll, MD  PCP: Azell Boll, MD  HISTORY OF PRESENT ILLNESS:   Shawn Perkins is a 68 y.o. old male with past medical history listed below, is here for new consult  of left adrenal nodule.   Pertinent Adrenal History: Patient is referred to endocrinology for further evaluation and management after diagnosis of left retinal nodule and biochemical test with indeterminant result of 1 mg dexamethasone  overnight suppression test with cortisol level of 2.0.  Patient was incidentally found to have 3.2 cm left adrenal mass 25 [place], on CT chest lung screening on December 29, 2022.  This was not visualized on CT scan in February 15, 2012.  He had MRI abdomen January 2025 with and without contrast for evaluation of left renal mass, showed enhancing mass of left renal gland measuring 3.2 x 2.9 cm In and opposed phase imaging is unfortunately limited by significant breath motion artifact, however there is no confidently identified macroscopic fat content within this lesion. This is concerning for malignancy; although lipid poor adenoma is a possibility, differential considerations would include pheochromocytoma as well as adrenal metastasis. Consider PET-CT for metabolic characterization and tissue sampling.  In April 13, 2023 patient underwent PET CT showed 3.1 x 2.8 cm left adrenal mass low-grade metabolic activity favoring a benign etiology. The delayed enhancement the lesion accompanied by the very high T2 signal on prior MRI (for example image 19 series 3 of that exam) raise the possibility of pheochromocytoma.   -There was a plan for possible adrenal nodule biopsy however with the reassuring results of PET/CT was later canceled and planned to repeat a CT scan in 3  months.  In Jul 04, 2023 patient had a CT adrenal/abdomen/pelvis with and without contrast showed left adrenal mass measuring 3.3 x 3.0 cm remains stable compared to December 29, 2022 but is new since 2013, shows relative and absolute contrast washout values consistent with benign edema normal.  Right adrenal gland normal.  Adrenals/Urinary Tract: A 3.3 x 3.0 cm left adrenal mass is seen. This remains stable in size compared to 12/29/2022 exam, but is new since 2013. This shows relative and absolute contrast washout values consistent with benign adenoma.  # Biochemical evaluation of left adrenal nodule : - Patient had normal 24-hour urine metanephrines.  He had normal aldosterone and renin ratio.  1 mg dexamethasone  overnight suppression test with cortisol of 2.0.  Indeterminate.   Latest Reference Range & Units 03/02/23 10:04  ALDOSTERONE 0.0 - 30.0 ng/dL 3.9  DHEA-SO4 40.9 - 811.9 ug/dL 14.7 (L)  Cortisol - AM 6.2 - 19.4 ug/dL 2.0 (L)  ALDOS/RENIN RATIO 0.0 - 30.0  0.2  (L): Data is abnormally low  Latest Reference Range & Units 04/06/23 13:36  Metanephrine, Ur Undefined ug/L 40  Normetanephrine, Ur Undefined ug/L 73  Total Volume  1,450  Metanephrines, 24H Ur 58 - 276 ug/24 hr 58  Normetanephr.,U,24h 156 - 729 ug/24 hr 106 (L)  (L): Data is abnormally low  -Patient denies palpitation, increased sweating, headache.  No proximal muscle weakness, easy bruising or facial plethora.  He has history of hypertension, controlled with 1 antihypertensive medication.  Denies history of significantly high blood pressure.   Patient was evaluated by hematology and oncology for left adrenal nodule  as well.  He has a history of atrial fibrillation on anticoagulation and following with cardiology.  He has history of EtOH use and tobacco use.   REVIEW OF SYSTEMS:  As per history of present illness.   PAST MEDICAL HISTORY: Past Medical History:  Diagnosis Date   CHF (congestive heart failure)  (HCC)    Diverticulosis    Heart murmur    History of cocaine abuse (HCC)    use to smoke crack cocaine, last use was more than 6 years ago   Hypertension    Rheumatic fever    as a child   Systolic heart failure    a. 11/09/10 echo EF 35%, b. 11/11/10 cath norm cors c. ECHO (11/2012) EF 35-50%   Umbilical hernia, incarcerated 11/2012   11/2012 exploratory lap with small bwl resection    PAST SURGICAL HISTORY: Past Surgical History:  Procedure Laterality Date   ATRIAL FIBRILLATION ABLATION N/A 06/13/2023   Procedure: ATRIAL FIBRILLATION ABLATION;  Surgeon: Ardeen Kohler, MD;  Location: [facility] INVASIVE CV LAB;  Service: Cardiovascular;  Laterality: N/A;   BOWEL RESECTION  12/05/2012   Procedure: SMALL BOWEL RESECTION;  Surgeon: Shela Derby, MD;  Location: [facility] OR;  Service: General;;   CARDIOVERSION N/A 11/09/2022   Procedure: CARDIOVERSION;  Surgeon: Darlis Eisenmenger, MD;  Location: [facility] INVASIVE CV LAB;  Service: Cardiovascular;  Laterality: N/A;   CARDIOVERSION N/A 12/01/2022   Procedure: CARDIOVERSION;  Surgeon: Darlis Eisenmenger, MD;  Location: [facility] INVASIVE CV LAB;  Service: Cardiovascular;  Laterality: N/A;   SMALL INTESTINE SURGERY  12/05/2012   TONSILLECTOMY     UMBILICAL HERNIA REPAIR     2014    ALLERGIES: Allergies  Allergen Reactions   Penicillins Other (See Comments)    Childhood reaction    FAMILY HISTORY:  Family History  Problem Relation Age of Onset   Kidney failure Mother    Healthy Father    Heart disease Neg Hx    Diabetes Neg Hx    Hyperlipidemia Neg Hx    COPD Neg Hx    Colon cancer Neg Hx    Rectal cancer Neg Hx    Stomach cancer Neg Hx     SOCIAL HISTORY: Social History   Socioeconomic History   Marital status: Single    Spouse name: Not on file   Number of children: Not on file   Years of education: Not on file   Highest education level: Not on file  Occupational History   Not on file  Tobacco Use   Smoking status: Some Days    Current  packs/day: 0.30    Average packs/day: 0.1 packs/day for 43.4 years (5.5 ttl pk-yrs)    Types: Cigarettes    Start date: 02/21/1980   Smokeless tobacco: Never  Substance and Sexual Activity   Alcohol use: Yes    Alcohol/week: 4.0 standard drinks of alcohol    Types: 4 Cans of beer per week    Comment: 12/05/2012 "no more than 12 pack beer/week; haven't had anything in 3 wks"   Drug use: Yes    Types: Marijuana    Comment: 12/05/2012 used to smoke weed & crack cocaine; none in over 6 years ago"   Sexual activity: Not Currently  Other Topics Concern   Not on file  Social History Narrative   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), Saturdays at apartment complex    Social Drivers of Health   Financial Resource Strain: Low Risk  (03/26/2023)  Overall Financial Resource Strain (CARDIA)    Difficulty of Paying Living Expenses: Not hard at all  Food Insecurity: No Food Insecurity (03/26/2023)   Hunger Vital Sign    Worried About Running Out of Food in the Last Year: Never true    Ran Out of Food in the Last Year: Never true  Transportation Needs: No Transportation Needs (03/26/2023)   PRAPARE - Administrator, Civil Service (Medical): No    Lack of Transportation (Non-Medical): No  Physical Activity: Sufficiently Active (03/26/2023)   Exercise Vital Sign    Days of Exercise per Week: 7 days    Minutes of Exercise per Session: 60 min  Stress: No Stress Concern Present (03/26/2023)   [employer] of Occupational Health - Occupational Stress Questionnaire    Feeling of Stress : Not at all  Social Connections: Moderately Integrated (03/26/2023)   Social Connection and Isolation Panel [NHANES]    Frequency of Communication with Friends and Family: More than three times a week    Frequency of Social Gatherings with Friends and Family: More than three times a week    Attends Religious Services: More than 4 times per year    Active Member of [employer] or Organizations: Yes    Attends  Engineer, structural: More than 4 times per year    Marital Status: Never married    MEDICATIONS:  Current Outpatient Medications  Medication Sig Dispense Refill   acetaminophen  (TYLENOL ) 500 MG tablet Take 1,000 mg by mouth every 6 (six) hours as needed (BACK PAIN.).     amiodarone  (PACERONE ) 200 MG tablet Take 1 tablet (200 mg total) by mouth daily.     apixaban  (ELIQUIS ) 5 MG TABS tablet Take 1 tablet (5 mg total) by mouth 2 (two) times daily. 180 tablet 3   atorvastatin  (LIPITOR) 40 MG tablet Take 1 tablet (40 mg total) by mouth daily. 90 tablet 3   Blood Pressure Monitoring (BLOOD PRESSURE CUFF) MISC Please check and monitor blood pressure daily 1 each 0   carvedilol  (COREG ) 12.5 MG tablet Take 1 tablet (12.5 mg total) by mouth 2 (two) times daily. 180 tablet 3   dapagliflozin  propanediol (FARXIGA ) 10 MG TABS tablet Take 1 tablet (10 mg total) by mouth daily before breakfast. 30 tablet 5   dexamethasone  (DECADRON ) 1 MG tablet Take 1 tablet by mouth once at 11 pm and have lab for cortisol next morning at 8 AM. 1 tablet 0   furosemide  (LASIX ) 40 MG tablet Take 1 tablet (40 mg total) by mouth as needed (AS NEEDED FOR SWELLING OR WEIGHT GAIN 3 POUNDS OVER NIGHT OR 5 POUNDS IN 1 WEEK). 90 tablet 1   KLOR-CON  M20 20 MEQ tablet TAKE 1 TABLET (20 MEQ TOTAL) BY MOUTH DAILY AS NEEDED ON THE DAYS THAT YOU TAKE LASIX  (FUROSEMIDE ) 90 tablet 1   losartan  (COZAAR ) 25 MG tablet Take 25 mg by mouth daily.     vitamin B-12 (CYANOCOBALAMIN ) 1000 MCG tablet Take 1 tablet (1,000 mcg total) by mouth daily. 90 tablet 3   No current facility-administered medications for this visit.    PHYSICAL EXAM: Vitals:   07/11/23 1449  BP: 126/70  Pulse: 65  Resp: 20  SpO2: 98%  Weight: 254 lb 6.4 oz (115.4 kg)  Height: 5\' 11"  (1.803 m)   Body mass index is 35.48 kg/m.  Wt Readings from Last 3 Encounters:  07/11/23 254 lb 6.4 oz (115.4 kg)  06/13/23 240 lb (108.9 kg)  05/15/23  246 lb (111.6 kg)     General: Well developed, well nourished male in no apparent distress. No cushingoid appearance HEENT: AT/[place], no external lesions. Hearing intact to the spoken word Eyes: EOMI. Conjunctiva clear and no icterus. Neck: Trachea midline, neck supple without appreciable thyromegaly or lymphadenopathy and no palpable thyroid  nodules Lungs: Clear to auscultation, no wheeze. Respirations not labored Heart: S1S2, Regular in rate and rhythm.  Abdomen: Soft, non tender, non distended, no striae.  Midline anterior abdominal wall scar with bulging of the anterior abdomen wall, ?  hernia. Neurologic: Alert, oriented, normal speech, deep tendon biceps reflexes normal,  no gross focal neurological deficit Extremities: No pedal pitting edema, no tremors of outstretched hands Skin: Warm, color good.  No bruises. Psychiatric: Does not appear depressed or anxious  PERTINENT HISTORIC LABORATORY AND IMAGING STUDIES:  All pertinent laboratory results were reviewed. Please see HPI for further details.  Lab Results  Component Value Date   CO2 21 05/21/2023   CL 109 (H) 05/21/2023   NA 143 05/21/2023   GLUCOSE 99 05/21/2023   BUN 17 05/21/2023   No components found for: "CORTRAND", "CORTISOL TOTAL AM", "ALDOSTERONE", "RENIN ACTIVITY", "DEHYDROEPIANDROSTERONE SULFATE", "CATECHOLAMINES FRACTIONATED"   ASSESSMENT / PLAN  1. Lesion of adrenal gland (HCC)   2. Left adrenal mass (HCC)     OUSMANE SEEMAN is a 68 y.o. old male who was incidentally found to have a 3.2 cm left  adrenal nodule in CT chest done for evaluation of cancer screening due to having history of cigarette smoking.  Patient later had MRI abdomen, PET CT and CT adrenal with left thyroid  nodule measuring 3.2 cm features consistent with benign adrenal nodule.  This nodule was stable on CT scan in May 2025 compared to CT scan in November 2024.  This nodule is new compared to CT scan in 2013. - Biochemical test with normal aldosterone, renin and  24-hour urine metanephrines.  He had indeterminate result of 1 mg overnight dexamethasone  suppression test with cortisol of 2.0, normal response < 1.8. - Patient has hypertension controlled with 1 antihypertensive medication. - Patient has no obvious clinical features suggestive of hypercortisolism or pheochromocytoma or hyperaldosteronism.  Plan: - I would like to repeat 1 mg dexamethasone  suppression test overnight with a.m. cortisol and dexamethasone  serum level. - Instructions provided to the patient. - If the 1 mg DST remains indeterminant we will plan for late-night salivary cortisol test. - Consider repeating CT adrenal in 1 to 2 years to monitor adrenal nodule. - Will plan for annual biochemical monitoring for adrenal nodule.  Diagnoses and all orders for this visit:  Lesion of adrenal gland (HCC) -     Cortisol -     Dexamethasone , blood  Left adrenal mass (HCC)  Other orders -     dexamethasone  (DECADRON ) 1 MG tablet; Take 1 tablet by mouth once at 11 pm and have lab for cortisol next morning at 8 AM.    DISPOSITION Follow up in clinic in 6 months suggested.  All questions answered and patient verbalized understanding of the plan.  [place] Monna Crean, MD [facility] Endocrinology [facility] [address] [place], [address] [place], [place] [address] Phone # [phone]  At least part of this note was generated using voice recognition software. Inadvertent word errors may have occurred, which were not recognized during the proofreading process.

## 2023-07-11 NOTE — Patient Instructions (Signed)
1 mg dexamethasone overnight suppression test. Instruction: Take 1 mg dexamethasone tablet at 11 PM ( timing is important) and have blood work for cortisol test in the next morning at 8 AM with fasting (timing is important).

## 2023-07-12 ENCOUNTER — Ambulatory Visit (HOSPITAL_COMMUNITY)
Admission: RE | Admit: 2023-07-12 | Discharge: 2023-07-12 | Disposition: A | Discharge status: Home / Self Care | Payer: Medicare (Managed Care) | Source: Ambulatory Visit | Attending: Internal Medicine | Admitting: Internal Medicine

## 2023-07-12 VITALS — BP 110/76 | HR 66 | Ht 71.0 in | Wt 255.0 lb

## 2023-07-12 DIAGNOSIS — I4819 Other persistent atrial fibrillation: Secondary | ICD-10-CM

## 2023-07-12 DIAGNOSIS — I4891 Unspecified atrial fibrillation: Secondary | ICD-10-CM | POA: Diagnosis not present

## 2023-07-12 DIAGNOSIS — D6869 Other thrombophilia: Secondary | ICD-10-CM

## 2023-07-12 NOTE — Progress Notes (Signed)
 Primary Care Physician: Azell Boll, MD Primary Cardiologist: None Electrophysiologist: Ardeen Kohler, MD     Referring Physician: Dr. Leesa Pulling is a 68 y.o. male with a history of HTN, tobacco use, ETOH use, chronic systolic HF secondary to NICM, left adrenal mass, and atrial fibrillation who presents for consultation in the [facility]. Patient is on Eliquis  5 mg BID for a CHADS2VASC score of 3.  On evaluation today, patient is currently in NSR. S/p Afib ablation on 06/13/23 by Dr. Daneil Dunker. No episodes of Afib since ablation. He feels better and notes he can walk for longer distances. No chest pain or SOB. Leg sites healed without issue. No missed doses of Eliquis  5 mg BID.  Today, he denies symptoms of orthopnea, PND, lower extremity edema, dizziness, presyncope, syncope, snoring, daytime somnolence, bleeding, or neurologic sequela. The patient is tolerating medications without difficulties and is otherwise without complaint today.    he has a BMI of Body mass index is 35.57 kg/m.Aaron Aas Filed Weights   07/12/23 1307  Weight: 115.7 kg    Current Outpatient Medications  Medication Sig Dispense Refill   acetaminophen  (TYLENOL ) 500 MG tablet Take 1,000 mg by mouth as needed (BACK PAIN.).     amiodarone  (PACERONE ) 200 MG tablet Take 1 tablet (200 mg total) by mouth daily.     apixaban  (ELIQUIS ) 5 MG TABS tablet Take 1 tablet (5 mg total) by mouth 2 (two) times daily. 180 tablet 3   atorvastatin  (LIPITOR) 40 MG tablet Take 1 tablet (40 mg total) by mouth daily. 90 tablet 3   Blood Pressure Monitoring (BLOOD PRESSURE CUFF) MISC Please check and monitor blood pressure daily 1 each 0   carvedilol  (COREG ) 12.5 MG tablet Take 1 tablet (12.5 mg total) by mouth 2 (two) times daily. 180 tablet 3   dapagliflozin  propanediol (FARXIGA ) 10 MG TABS tablet Take 1 tablet (10 mg total) by mouth daily before breakfast. 30 tablet 5   dexamethasone  (DECADRON ) 1  MG tablet Take 1 tablet by mouth once at 11 pm and have lab for cortisol next morning at 8 AM. 1 tablet 0   furosemide  (LASIX ) 40 MG tablet Take 1 tablet (40 mg total) by mouth as needed (AS NEEDED FOR SWELLING OR WEIGHT GAIN 3 POUNDS OVER NIGHT OR 5 POUNDS IN 1 WEEK). 90 tablet 1   KLOR-CON  M20 20 MEQ tablet TAKE 1 TABLET (20 MEQ TOTAL) BY MOUTH DAILY AS NEEDED ON THE DAYS THAT YOU TAKE LASIX  (FUROSEMIDE ) 90 tablet 1   losartan  (COZAAR ) 25 MG tablet Take 25 mg by mouth daily.     vitamin B-12 (CYANOCOBALAMIN ) 1000 MCG tablet Take 1 tablet (1,000 mcg total) by mouth daily. 90 tablet 3   No current facility-administered medications for this encounter.    Atrial Fibrillation Management history:  Previous antiarrhythmic drugs: amiodarone  Previous cardioversions: none Previous ablations: 06/13/23 Anticoagulation history: Eliquis    ROS- All systems are reviewed and negative except as per the HPI above.  Physical Exam: BP 110/76   Pulse 66   Ht 5\' 11"  (1.803 m)   Wt 115.7 kg   BMI 35.57 kg/m   GEN: Well nourished, well developed in no acute distress NECK: No JVD; No carotid bruits CARDIAC: Regular rate and rhythm, no murmurs, rubs, gallops RESPIRATORY:  Clear to auscultation without rales, wheezing or rhonchi  ABDOMEN: Soft, non-tender, non-distended EXTREMITIES:  No edema; No deformity   EKG today demonstrates  Vent.  rate 66 BPM PR interval 180 ms QRS duration 104 ms QT/QTcB 418/438 ms P-R-T axes 50 -15 22 Normal sinus rhythm Minimal voltage criteria for LVH, may be normal variant ( Cornell product ) Nonspecific T wave abnormality Abnormal ECG   Echo 01/25/23 demonstrated  1. Left ventricular ejection fraction, by estimation, is 50 to 55%. The  left ventricle has low normal function. Left ventricular endocardial  border not optimally defined to evaluate regional wall motion. The left  ventricular internal cavity size was  mildly dilated. There is mild concentric left  ventricular hypertrophy.  Left ventricular diastolic parameters are indeterminate.   2. Right ventricular systolic function is normal. The right ventricular  size is normal.   3. Left atrial size was mildly dilated.   4. The mitral valve is degenerative. Mild mitral valve regurgitation.  Mild mitral stenosis. The mean mitral valve gradient is 4.0 mmHg with  average heart rate of 53 bpm.   5. The aortic valve is tricuspid. There is moderate calcification of the  aortic valve. There is mild thickening of the aortic valve. Aortic valve  regurgitation is mild. Aortic valve sclerosis is present, with no evidence  of aortic valve stenosis.   6. The inferior vena cava is normal in size with greater than 50%  respiratory variability, suggesting right atrial pressure of 3 mmHg.   ASSESSMENT & PLAN CHA2DS2-VASc Score = 3  The patient's score is based upon: CHF History: 1 HTN History: 1 Diabetes History: 0 Stroke History: 0 Vascular Disease History: 0 Age Score: 1 Gender Score: 0       ASSESSMENT AND PLAN: Persistent Atrial Fibrillation (ICD10:  I48.19) The patient's CHA2DS2-VASc score is 3, indicating a 3.2% annual risk of stroke.   S/p Afib ablation on 06/13/23 by Dr. Daneil Dunker.  He is currently in NSR. Stop amiodarone  today.   Secondary Hypercoagulable State (ICD10:  D68.69) The patient is at significant risk for stroke/thromboembolism based upon his CHA2DS2-VASc Score of 3.  Continue Apixaban  (Eliquis ).  Continue Eliquis  without interruption.      Follow up with EP as scheduled.    Minnie Amber, PA-C  Afib Clinic [facility] [address] [place], [place] [address] [phone]

## 2023-07-12 NOTE — Patient Instructions (Signed)
 Stop amiodarone as of today

## 2023-07-18 ENCOUNTER — Inpatient Hospital Stay: Payer: Medicare (Managed Care)

## 2023-07-18 ENCOUNTER — Inpatient Hospital Stay: Payer: Medicare (Managed Care) | Attending: Hematology and Oncology | Admitting: Hematology and Oncology

## 2023-07-18 ENCOUNTER — Other Ambulatory Visit: Payer: Self-pay | Admitting: Hematology and Oncology

## 2023-07-18 VITALS — BP 152/77 | HR 66 | Temp 98.2°F | Resp 13 | Wt 260.1 lb

## 2023-07-18 DIAGNOSIS — E278 Other specified disorders of adrenal gland: Secondary | ICD-10-CM

## 2023-07-18 DIAGNOSIS — F17210 Nicotine dependence, cigarettes, uncomplicated: Secondary | ICD-10-CM | POA: Insufficient documentation

## 2023-07-18 DIAGNOSIS — D3502 Benign neoplasm of left adrenal gland: Secondary | ICD-10-CM | POA: Diagnosis present

## 2023-07-18 LAB — CBC WITH DIFFERENTIAL (CANCER CENTER ONLY)
Abs Immature Granulocytes: 0.02 10*3/uL (ref 0.00–0.07)
Basophils Absolute: 0.1 10*3/uL (ref 0.0–0.1)
Basophils Relative: 1 %
Eosinophils Absolute: 0.2 10*3/uL (ref 0.0–0.5)
Eosinophils Relative: 3 %
HCT: 42.7 % (ref 39.0–52.0)
Hemoglobin: 14.4 g/dL (ref 13.0–17.0)
Immature Granulocytes: 0 %
Lymphocytes Relative: 28 %
Lymphs Abs: 2.1 10*3/uL (ref 0.7–4.0)
MCH: 33.5 pg (ref 26.0–34.0)
MCHC: 33.7 g/dL (ref 30.0–36.0)
MCV: 99.3 fL (ref 80.0–100.0)
Monocytes Absolute: 0.5 10*3/uL (ref 0.1–1.0)
Monocytes Relative: 7 %
Neutro Abs: 4.5 10*3/uL (ref 1.7–7.7)
Neutrophils Relative %: 61 %
Platelet Count: 158 10*3/uL (ref 150–400)
RBC: 4.3 MIL/uL (ref 4.22–5.81)
RDW: 14.6 % (ref 11.5–15.5)
WBC Count: 7.4 10*3/uL (ref 4.0–10.5)
nRBC: 0 % (ref 0.0–0.2)

## 2023-07-18 LAB — CMP (CANCER CENTER ONLY)
ALT: 20 U/L (ref 0–44)
AST: 14 U/L — ABNORMAL LOW (ref 15–41)
Albumin: 4.3 g/dL (ref 3.5–5.0)
Alkaline Phosphatase: 121 U/L (ref 38–126)
Anion gap: 5 (ref 5–15)
BUN: 16 mg/dL (ref 8–23)
CO2: 25 mmol/L (ref 22–32)
Calcium: 9.2 mg/dL (ref 8.9–10.3)
Chloride: 110 mmol/L (ref 98–111)
Creatinine: 1.37 mg/dL — ABNORMAL HIGH (ref 0.61–1.24)
GFR, Estimated: 56 mL/min — ABNORMAL LOW (ref >60)
Glucose, Bld: 104 mg/dL — ABNORMAL HIGH (ref 70–99)
Potassium: 4.2 mmol/L (ref 3.5–5.1)
Sodium: 140 mmol/L (ref 135–145)
Total Bilirubin: 0.5 mg/dL (ref 0.0–1.2)
Total Protein: 7.4 g/dL (ref 6.5–8.1)

## 2023-07-18 LAB — LACTATE DEHYDROGENASE: LDH: 155 U/L (ref 98–192)

## 2023-07-18 NOTE — Progress Notes (Signed)
 [facility] [phone]   [phone]  PROGRESS NOTE  Patient Care Team: Azell Boll, MD as PCP - General (Family Medicine) Ardeen Kohler, MD as PCP - Electrophysiology (Cardiology) Annella Barrows, RN as Nurse Navigator (Medical Oncology)  Hematological/Oncological History # Benign Adrenal Adenoma  07/04/2022: CT abdomen/pelvis showed stable 3.3 cm left adrenal mass, which shows contrast washout values consistent with benign adenoma (No followup imaging is recommended)  Interval History:  Shawn Perkins 68 y.o. male with medical history significant for benign adrenal adenoma presents for a follow up visit. The patient's last visit was on 04/04/2023. In the interim since the last visit he underwent PET CT and subsequent repeat CT scan which strongly favor a benign adenoma.   On exam today Mr. Bordon reports he is had no changes in his health interim since her last visit.  He notes that he did have an EP study performed on 06/13/2023.  He reports he went well.  He notes he has had no issues with fevers, chills, sweats, nausea, Mi or diarrhea.  He is not currently having any pain anywhere.  He reports his energy levels are good.  He has been gaining weight and is up 10 pounds.  He reports he is not having any issues with urinary frequency or blood in the urine.  He reports nothing out of the ordinary.  He otherwise denies any fevers, chills, sweats, nausea, Mi or diarrhea.  A full 10 point ROS is otherwise negative.  The bulk of our discussion focused on the results of his scans.  At this time it appears to be a benign adenoma in his adrenal gland.  There is no indication for biopsy or further imaging at this time.  It appears that this is a benign finding.  In the event it were to cause mass effect or other difficulties could consider resection, but as for now no need for intervention.  The patient voiced understanding of our findings and plan moving  forward.  MEDICAL HISTORY:  Past Medical History:  Diagnosis Date   CHF (congestive heart failure) (HCC)    Diverticulosis    Heart murmur    History of cocaine abuse (HCC)    use to smoke crack cocaine, last use was more than 6 years ago   Hypertension    Rheumatic fever    as a child   Systolic heart failure    a. 11/09/10 echo EF 35%, b. 11/11/10 cath norm cors c. ECHO (11/2012) EF 35-50%   Umbilical hernia, incarcerated 11/2012   11/2012 exploratory lap with small bwl resection    SURGICAL HISTORY: Past Surgical History:  Procedure Laterality Date   ATRIAL FIBRILLATION ABLATION N/A 06/13/2023   Procedure: ATRIAL FIBRILLATION ABLATION;  Surgeon: Ardeen Kohler, MD;  Location: [facility] INVASIVE CV LAB;  Service: Cardiovascular;  Laterality: N/A;   BOWEL RESECTION  12/05/2012   Procedure: SMALL BOWEL RESECTION;  Surgeon: Shela Derby, MD;  Location: [facility] OR;  Service: General;;   CARDIOVERSION N/A 11/09/2022   Procedure: CARDIOVERSION;  Surgeon: Darlis Eisenmenger, MD;  Location: [facility] INVASIVE CV LAB;  Service: Cardiovascular;  Laterality: N/A;   CARDIOVERSION N/A 12/01/2022   Procedure: CARDIOVERSION;  Surgeon: Darlis Eisenmenger, MD;  Location: [facility] INVASIVE CV LAB;  Service: Cardiovascular;  Laterality: N/A;   SMALL INTESTINE SURGERY  12/05/2012   TONSILLECTOMY     UMBILICAL HERNIA REPAIR     2014    SOCIAL HISTORY: Social History   Socioeconomic  History   Marital status: Single    Spouse name: Not on file   Number of children: Not on file   Years of education: Not on file   Highest education level: Not on file  Occupational History   Not on file  Tobacco Use   Smoking status: Some Days    Current packs/day: 0.30    Average packs/day: 0.1 packs/day for 43.4 years (5.5 ttl pk-yrs)    Types: Cigarettes    Start date: 02/21/1980   Smokeless tobacco: Never  Substance and Sexual Activity   Alcohol use: Yes    Alcohol/week: 4.0 standard drinks of alcohol    Types: 4 Cans of beer  per week    Comment: 12/05/2012 "no more than 12 pack beer/week; haven't had anything in 3 wks"   Drug use: Yes    Types: Marijuana    Comment: 12/05/2012 used to smoke weed & crack cocaine; none in over 6 years ago"   Sexual activity: Not Currently  Other Topics Concern   Not on file  Social History Narrative   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), Saturdays at apartment complex    Social Drivers of Health   Financial Resource Strain: Low Risk  (03/26/2023)   Overall Financial Resource Strain (CARDIA)    Difficulty of Paying Living Expenses: Not hard at all  Food Insecurity: No Food Insecurity (03/26/2023)   Hunger Vital Sign    Worried About Running Out of Food in the Last Year: Never true    Ran Out of Food in the Last Year: Never true  Transportation Needs: No Transportation Needs (03/26/2023)   PRAPARE - Administrator, Civil Service (Medical): No    Lack of Transportation (Non-Medical): No  Physical Activity: Sufficiently Active (03/26/2023)   Exercise Vital Sign    Days of Exercise per Week: 7 days    Minutes of Exercise per Session: 60 min  Stress: No Stress Concern Present (03/26/2023)   [employer] of Occupational Health - Occupational Stress Questionnaire    Feeling of Stress : Not at all  Social Connections: Moderately Integrated (03/26/2023)   Social Connection and Isolation Panel [NHANES]    Frequency of Communication with Friends and Family: More than three times a week    Frequency of Social Gatherings with Friends and Family: More than three times a week    Attends Religious Services: More than 4 times per year    Active Member of [employer] or Organizations: Yes    Attends Engineer, structural: More than 4 times per year    Marital Status: Never married  Intimate Partner Violence: Not At Risk (03/26/2023)   Humiliation, Afraid, Rape, and Kick questionnaire    Fear of Current or Ex-Partner: No    Emotionally Abused: No    Physically Abused: No     Sexually Abused: No    FAMILY HISTORY: Family History  Problem Relation Age of Onset   Kidney failure Mother    Healthy Father    Heart disease Neg Hx    Diabetes Neg Hx    Hyperlipidemia Neg Hx    COPD Neg Hx    Colon cancer Neg Hx    Rectal cancer Neg Hx    Stomach cancer Neg Hx     ALLERGIES:  is allergic to penicillins.  MEDICATIONS:  Current Outpatient Medications  Medication Sig Dispense Refill   acetaminophen  (TYLENOL ) 500 MG tablet Take 1,000 mg by mouth as needed (BACK PAIN.).  amiodarone  (PACERONE ) 200 MG tablet Take 1 tablet (200 mg total) by mouth daily.     apixaban  (ELIQUIS ) 5 MG TABS tablet Take 1 tablet (5 mg total) by mouth 2 (two) times daily. 180 tablet 3   atorvastatin  (LIPITOR) 40 MG tablet Take 1 tablet (40 mg total) by mouth daily. 90 tablet 3   Blood Pressure Monitoring (BLOOD PRESSURE CUFF) MISC Please check and monitor blood pressure daily 1 each 0   carvedilol  (COREG ) 12.5 MG tablet Take 1 tablet (12.5 mg total) by mouth 2 (two) times daily. 180 tablet 3   dapagliflozin  propanediol (FARXIGA ) 10 MG TABS tablet Take 1 tablet (10 mg total) by mouth daily before breakfast. 30 tablet 5   dexamethasone  (DECADRON ) 1 MG tablet Take 1 tablet by mouth once at 11 pm and have lab for cortisol next morning at 8 AM. 1 tablet 0   furosemide  (LASIX ) 40 MG tablet Take 1 tablet (40 mg total) by mouth as needed (AS NEEDED FOR SWELLING OR WEIGHT GAIN 3 POUNDS OVER NIGHT OR 5 POUNDS IN 1 WEEK). 90 tablet 1   KLOR-CON  M20 20 MEQ tablet TAKE 1 TABLET (20 MEQ TOTAL) BY MOUTH DAILY AS NEEDED ON THE DAYS THAT YOU TAKE LASIX  (FUROSEMIDE ) 90 tablet 1   losartan  (COZAAR ) 25 MG tablet Take 25 mg by mouth daily.     vitamin B-12 (CYANOCOBALAMIN ) 1000 MCG tablet Take 1 tablet (1,000 mcg total) by mouth daily. 90 tablet 3   No current facility-administered medications for this visit.    REVIEW OF SYSTEMS:   Constitutional: ( - ) fevers, ( - )  chills , ( - ) night  sweats Eyes: ( - ) blurriness of vision, ( - ) double vision, ( - ) watery eyes Ears, nose, mouth, throat, and face: ( - ) mucositis, ( - ) sore throat Respiratory: ( - ) cough, ( - ) dyspnea, ( - ) wheezes Cardiovascular: ( - ) palpitation, ( - ) chest discomfort, ( - ) lower extremity swelling Gastrointestinal:  ( - ) nausea, ( - ) heartburn, ( - ) change in bowel habits Skin: ( - ) abnormal skin rashes Lymphatics: ( - ) new lymphadenopathy, ( - ) easy bruising Neurological: ( - ) numbness, ( - ) tingling, ( - ) new weaknesses Behavioral/Psych: ( - ) mood change, ( - ) new changes  All other systems were reviewed with the patient and are negative.  PHYSICAL EXAMINATION: Vitals:   07/18/23 1445  BP: (!) 152/77  Pulse: 66  Resp: 13  Temp: 98.2 F (36.8 C)  SpO2: 99%   Filed Weights   07/18/23 1445  Weight: 260 lb 1.6 oz (118 kg)    GENERAL: Well-appearing elderly African-American male, alert, no distress and comfortable SKIN: skin color, texture, turgor are normal, no rashes or significant lesions EYES: conjunctiva are pink and non-injected, sclera clear LUNGS: clear to auscultation and percussion with normal breathing effort HEART: regular rate & rhythm and no murmurs and no lower extremity edema Musculoskeletal: no cyanosis of digits and no clubbing  PSYCH: alert & oriented x 3, fluent speech NEURO: no focal motor/sensory deficits  LABORATORY DATA:  I have reviewed the data as listed    Latest Ref Rng & Units 07/18/2023    2:31 PM 05/21/2023    2:44 PM 04/04/2023    3:43 PM  CBC  WBC 4.0 - 10.5 K/uL 7.4  8.1  7.7   Hemoglobin 13.0 - 17.0 g/dL 95.6  14.8  15.4   Hematocrit 39.0 - 52.0 % 42.7  44.2  46.9   Platelets 150 - 400 K/uL 158  142  175        Latest Ref Rng & Units 05/21/2023    2:44 PM 04/04/2023    3:43 PM 03/02/2023   10:04 AM  CMP  Glucose 70 - 99 mg/dL 99  78    BUN 8 - 27 mg/dL 17  18    Creatinine 1.61 - 1.27 mg/dL 0.96  0.45    Sodium 409 - 144  mmol/L 143  141    Potassium 3.5 - 5.2 mmol/L 4.4  4.3    Chloride 96 - 106 mmol/L 109  108    CO2 20 - 29 mmol/L 21  26    Calcium  8.6 - 10.2 mg/dL 9.0  9.5    Total Protein 6.5 - 8.1 g/dL  7.9  6.9   Total Bilirubin 0.0 - 1.2 mg/dL  0.7  0.5   Alkaline Phos 38 - 126 U/L  117  167   AST 15 - 41 U/L  16  15   ALT 0 - 44 U/L  28  31     No results found for: "MPROTEIN" No results found for: "KPAFRELGTCHN", "LAMBDASER", "KAPLAMBRATIO"    RADIOGRAPHIC STUDIES: I have personally reviewed the radiological images as listed and agreed with the findings in the report. CT ABDOMEN PELVIS W WO CONTRAST Result Date: 07/05/2023 CLINICAL DATA:  Indeterminate left adrenal mass. * Tracking Code: BO * EXAM: CT ABDOMEN AND PELVIS WITHOUT AND WITH CONTRAST TECHNIQUE: Multidetector CT imaging of the abdomen and pelvis was performed following the standard protocol before and following the bolus administration of intravenous contrast. RADIATION DOSE REDUCTION: This exam was performed according to the departmental dose-optimization program which includes automated exposure control, adjustment of the mA and/or kV according to patient size and/or use of iterative reconstruction technique. CONTRAST:  OMNIPAQUE  IOHEXOL  300 MG/ML  SOLN COMPARISON:  MRI on 03/22/2023, CT on 12/29/2022 and 02/15/2012 FINDINGS: Lower Chest: No acute findings. Hepatobiliary: No suspicious hepatic masses identified. Sub-cm subcapsular cyst in lateral right hepatic lobe. Gallbladder is unremarkable. No evidence of biliary ductal dilatation. Pancreas:  No mass or inflammatory changes. Spleen: Within normal limits in size and appearance. Adrenals/Urinary Tract: A 3.3 x 3.0 cm left adrenal mass is seen. This remains stable in size compared to 12/29/2022 exam, but is new since 2013. This shows relative and absolute contrast washout values consistent with benign adenoma. Benign-appearing left renal cyst again noted (No followup imaging is  recommended). No suspicious renal masses. No evidence of urolithiasis or hydronephrosis. No masses seen involving the renal collecting systems, ureters, or bladder. Mild diffuse bladder wall thickening and distension seen, consistent with chronic bladder outlet obstruction. Stomach/Bowel: No evidence of obstruction, inflammatory process or abnormal fluid collections. Extensive colonic diverticulosis is again seen, without signs of diverticulitis. Two adjacent epigastric ventral hernias are seen which contain only fat. A small fat-containing paraumbilical hernia is also seen. Vascular/Lymphatic: No pathologically enlarged lymph nodes. No acute vascular findings. Reproductive:  Mildly enlarged prostate gland. Other:  None. Musculoskeletal:  No suspicious bone lesions identified. IMPRESSION: Stable 3.3 cm left adrenal mass, which shows contrast washout values consistent with benign adenoma (No followup imaging is recommended) . Mildly enlarged prostate, and findings of chronic bladder outlet obstruction. Colonic diverticulosis, without radiographic evidence of diverticulitis. Multiple fat-containing ventral hernias. Electronically Signed   By: Marlyce Sine M.D.   On: 07/05/2023 13:35  ASSESSMENT & PLAN KALEEL SCHMIEDER 68 y.o. male with medical history significant for benign adrenal adenoma presents for a follow up visit.   # Benign Adrenal Adenoma  -- Lesion was not FDG avid on 04/13/2023. -- Repeat scan on 07/04/2023 help supports diagnosis of a benign adrenal adenoma.  Radiology does not recommend further imaging. -- No further biopsies or workup required at this time -- Labs today show white blood cell 7.4, hemoglobin 14.4, MCV 99.3, platelets 158.  Creatinine LFT stable -- Return to clinic on an as-needed basis moving forward.   No orders of the defined types were placed in this encounter.   All questions were answered. The patient knows to call the clinic with any problems, questions or  concerns.  A total of more than 25 minutes were spent on this encounter with face-to-face time and non-face-to-face time, including preparing to see the patient, ordering tests and/or medications, counseling the patient and coordination of care as outlined above.   Rogerio Clay, MD Department of Hematology/Oncology [facility] Phone: [phone] Pager: [phone] Email: [email or web address]  07/18/2023 2:48 PM

## 2023-07-23 DIAGNOSIS — M47816 Spondylosis without myelopathy or radiculopathy, lumbar region: Secondary | ICD-10-CM | POA: Diagnosis not present

## 2023-07-24 ENCOUNTER — Other Ambulatory Visit: Payer: Medicare (Managed Care)

## 2023-08-01 ENCOUNTER — Other Ambulatory Visit: Payer: Medicare (Managed Care)

## 2023-08-01 DIAGNOSIS — E279 Disorder of adrenal gland, unspecified: Secondary | ICD-10-CM | POA: Diagnosis not present

## 2023-08-02 ENCOUNTER — Ambulatory Visit: Payer: Self-pay | Admitting: Endocrinology

## 2023-08-10 LAB — CORTISOL: Cortisol, Plasma: 1.7 ug/dL — ABNORMAL LOW

## 2023-08-10 LAB — DEXAMETHASONE, BLOOD: Dexamethasone, Serum: 391 ng/dL

## 2023-08-17 ENCOUNTER — Other Ambulatory Visit: Payer: Self-pay

## 2023-09-09 NOTE — Progress Notes (Unsigned)
  Electrophysiology Office Note:   Date:  09/13/2023  ID:  Shawn Perkins, DOB November 17, 1955, MRN [id number]  Primary Cardiologist: None Primary Heart Failure: None Electrophysiologist: Fonda Kitty, MD      History of Present Illness:   Shawn Perkins is a 68 y.o. male with h/o AF, HTN, HFrEF due to NICM, left adrenal mass, tobacco abuse, ETOH abuse seen today for routine electrophysiology follow-up s/p Ablation.  Since last being seen in our clinic the patient reports doing well post ablation. No issues with groin sites. He states he gave up drinking and smoking and traded it for sweets.  Thinks he has gained 16 lbs since stopping smoking.    He denies chest pain, palpitations, dyspnea, PND, orthopnea, nausea, vomiting, dizziness, syncope, edema, weight gain, or early satiety.    Review of systems complete and found to be negative unless listed in HPI.   EP Information / Studies Reviewed:    EKG is ordered today. Personal review as below.  EKG Interpretation Date/Time:  Thursday September 13 2023 11:01:29 EDT Ventricular Rate:  70 PR Interval:  180 QRS Duration:  106 QT Interval:  396 QTC Calculation: 427 R Axis:   -35  Text Interpretation: Sinus rhythm with Premature atrial complexes Left axis deviation Confirmed by Aniceto Jarvis ([id number]) on 09/13/2023 11:09:24 AM   Arrhythmia / AAD AF DCCV 10/2022, 11/2022  EPS 06/13/23 > successful PVI, ablation of PW   Risk Assessment/Calculations:    CHA2DS2-VASc Score = 3   This indicates a 3.2% annual risk of stroke. The patient's score is based upon: CHF History: 1 HTN History: 1 Diabetes History: 0 Stroke History: 0 Vascular Disease History: 0 Age Score: 1 Gender Score: 0             Physical Exam:   VS:  BP (!) 101/44   Pulse 72   Ht 5' 11 (1.803 m)   Wt 267 lb 6.4 oz (121.3 kg)   SpO2 97%   BMI 37.29 kg/m    Wt Readings from Last 3 Encounters:  09/13/23 267 lb 6.4 oz (121.3 kg)  07/18/23 260 lb 1.6 oz (118 kg)  07/12/23  255 lb (115.7 kg)     GEN: Well nourished, well developed in no acute distress NECK: No JVD; No carotid bruits CARDIAC: Regular rate and rhythm, no murmurs, rubs, gallops RESPIRATORY:  Clear to auscultation without rales, wheezing or rhonchi  ABDOMEN: Soft, non-tender, non-distended EXTREMITIES:  Trace LE edema; No deformity   ASSESSMENT AND PLAN:    Persistent Atrial Fibrillation  CHA2DS2-VASc 3, s/p ablation 06/13/23  -OAC for stroke prophylaxis  -EKG with NSR   -no AF burden post ablation    -pt had already stopped amiodarone  at time of visit  -coreg  12.5 mg BID  -does not have a reliable way to home monitor for AF  Secondary Hypercoagulable State  -continue Eliquis  5mg  BID, dose reviewed and appropriate by age / wt   HFrEF due to NICM  -GDMT per Advanced HF Team  -trace ankle edema > encouraged compression stockings  -pt understands lasix  dosing and takes when weight is up   Follow up with Dr. Kitty / EP APP in 6 months  Signed, Jarvis Aniceto, NP-C, AGACNP-BC [facility] - Electrophysiology  09/13/2023, 12:32 PM

## 2023-09-10 ENCOUNTER — Other Ambulatory Visit (HOSPITAL_COMMUNITY): Payer: Self-pay | Admitting: Internal Medicine

## 2023-09-13 ENCOUNTER — Ambulatory Visit: Payer: Medicare (Managed Care) | Attending: Pulmonary Disease | Admitting: Pulmonary Disease

## 2023-09-13 ENCOUNTER — Encounter: Payer: Self-pay | Admitting: Pulmonary Disease

## 2023-09-13 VITALS — BP 101/44 | HR 72 | Ht 71.0 in | Wt 267.4 lb

## 2023-09-13 DIAGNOSIS — I4819 Other persistent atrial fibrillation: Secondary | ICD-10-CM | POA: Diagnosis not present

## 2023-09-13 DIAGNOSIS — D6869 Other thrombophilia: Secondary | ICD-10-CM

## 2023-09-13 DIAGNOSIS — I5022 Chronic systolic (congestive) heart failure: Secondary | ICD-10-CM

## 2023-09-13 NOTE — Patient Instructions (Signed)
 Medication Instructions:  Stop Amiodarone     *If you need a refill on your cardiac medications before your next appointment, please call your pharmacy*  Lab Work: No lab work today If you have labs (blood work) drawn today and your tests are completely normal, you will receive your results only by: MyChart Message (if you have MyChart) OR A paper copy in the mail If you have any lab test that is abnormal or we need to change your treatment, we will call you to review the results.  Testing/Procedures: No testing/procedures were scheduled today  Follow-Up: At [facility], you and your health needs are our priority.  As part of our continuing mission to provide you with exceptional heart care, our providers are all part of one team.  This team includes your primary Cardiologist (physician) and Advanced Practice Providers or APPs (Physician Assistants and Nurse Practitioners) who all work together to provide you with the care you need, when you need it.  Your next appointment:   6 month(s)  Provider:   You may see Fonda Kitty, MD or one of the following Advanced Practice Providers on your designated Care Team:    Daphne Barrack, NP    We recommend signing up for the patient portal called MyChart.  Sign up information is provided on this After Visit Summary.  MyChart is used to connect with patients for Virtual Visits (Telemedicine).  Patients are able to view lab/test results, encounter notes, upcoming appointments, etc.  Non-urgent messages can be sent to your provider as well.   To learn more about what you can do with MyChart, go to [email or web address].

## 2023-10-09 ENCOUNTER — Other Ambulatory Visit (HOSPITAL_COMMUNITY): Payer: Self-pay | Admitting: Family Medicine

## 2023-10-18 DIAGNOSIS — M47816 Spondylosis without myelopathy or radiculopathy, lumbar region: Secondary | ICD-10-CM | POA: Diagnosis not present

## 2023-10-24 DIAGNOSIS — M47816 Spondylosis without myelopathy or radiculopathy, lumbar region: Secondary | ICD-10-CM | POA: Diagnosis not present

## 2023-11-14 ENCOUNTER — Telehealth: Payer: Self-pay

## 2023-11-14 NOTE — Telephone Encounter (Signed)
 Patient calls nurse line requesting to schedule appointment with Dr. Delores to discuss continued back pain.   Patient reports that he has been receiving injections at [facility], however, these are no longer controlling his pain.   Patient feels that he needs something stronger.   Patient reports that he was told by specialist he would need to follow back up with PCP for  stronger treatment/imaging.   Scheduled patient with PCP for next available on 12/10/2023.  Chiquita JAYSON English, RN

## 2023-12-07 NOTE — Progress Notes (Unsigned)
    SUBJECTIVE:   CHIEF COMPLAINT: medication check HPI:   Shawn Perkins is a 68 y.o.  with history notable for HFrEF, atrial fibrillation s/p ablation,  presenting for back pain.   Discussed the use of AI scribe software for clinical note transcription with the patient, who gave verbal consent to proceed.  History of Present Illness Shawn Perkins is a 68 year old male who presents with back pain and weight management concerns.  Chronic back pain and lower extremity paresthesia - Persistent back pain attributed to arthritis with intermittent R sided sciatica. The patient denies anesthesia in the saddle area, bowel/bladder incontinence, fever.  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 - Physical tasks increasingly challenging due to weight and back pain - Has quit alcohol and tobacco use  Ocular lesion - Spot on eye has changed from a small spot to bmp   HTN/Atrial fibrillation  - No chest pain, palpitations, or difficulty breathing - Able to afford medications      PERTINENT  PMH / PSH/Family/Social History : adrenal lesion, found to be benign   OBJECTIVE:   BP 112/73   Pulse 77   Ht 5' 11 (1.803 m)   Wt 272 lb 9.6 oz (123.7 kg)   SpO2 98%   BMI 38.02 kg/m   Today's weight:  Last Weight  Most recent update: 12/10/2023 10:00 AM    Weight  123.7 kg (272 lb 9.6 oz)            Review  of prior weights: Filed Weights   12/10/23 1000  Weight: 272 lb 9.6 oz (123.7 kg)    On L zygomatic area there is is an irregular hyperpigmented macule   Cardiac: Regular rate and rhythm. Normal S1/S2. No murmurs, rubs, or gallops appreciated. Lungs: Clear bilaterally to ascultation.  Psych: Pleasant and appropriate    ASSESSMENT/PLAN:   Assessment & Plan Essential hypertension At goal  Continue current therapy  Hyperpigmented skin lesion Referral to Dermatology  Weight gain Discussed lifestyle changes, health habits Consider food diary, offered RD referral, declined Consider GLP1 in future given underlying CV disease Tobacco abuse Congratulated on eliminating!  Chronic left-sided low back pain without sciatica Uncontrolled He will follow up with spine surgery Could consider a small amount of oxycodone  each month to allow him to do his work--discussed this would involve visit for pain contract   [facility] not functioning, he will return for vaccines   At next visit discuss low dose CT again   Suzann Daring, MD  Family Medicine Teaching Service  [facility]

## 2023-12-10 ENCOUNTER — Ambulatory Visit: Payer: Medicare (Managed Care) | Admitting: Family Medicine

## 2023-12-10 ENCOUNTER — Encounter: Payer: Self-pay | Admitting: Family Medicine

## 2023-12-10 VITALS — BP 112/73 | HR 77 | Ht 71.0 in | Wt 272.6 lb

## 2023-12-10 DIAGNOSIS — Z720 Tobacco use: Secondary | ICD-10-CM

## 2023-12-10 DIAGNOSIS — G8929 Other chronic pain: Secondary | ICD-10-CM | POA: Diagnosis not present

## 2023-12-10 DIAGNOSIS — I7121 Aneurysm of the ascending aorta, without rupture: Secondary | ICD-10-CM

## 2023-12-10 DIAGNOSIS — R635 Abnormal weight gain: Secondary | ICD-10-CM

## 2023-12-10 DIAGNOSIS — I10 Essential (primary) hypertension: Secondary | ICD-10-CM

## 2023-12-10 DIAGNOSIS — M545 Low back pain, unspecified: Secondary | ICD-10-CM | POA: Diagnosis not present

## 2023-12-10 DIAGNOSIS — L819 Disorder of pigmentation, unspecified: Secondary | ICD-10-CM

## 2023-12-10 DIAGNOSIS — I4891 Unspecified atrial fibrillation: Secondary | ICD-10-CM

## 2023-12-10 NOTE — Assessment & Plan Note (Signed)
 Uncontrolled He will follow up with spine surgery Could consider a small amount of oxycodone  each month to allow him to do his work--discussed this would involve visit for pain contract

## 2023-12-10 NOTE — Assessment & Plan Note (Signed)
 Congratulated on eliminating!

## 2023-12-10 NOTE — Assessment & Plan Note (Signed)
At goal. Continue current therapy.  

## 2023-12-10 NOTE — Patient Instructions (Signed)
 It was wonderful to see you today.  Please bring ALL of your medications with you to every visit.   Today we talked about:  - Eliminate sugary beverages like sweet tea   - Hunny buns- at most one per week  - Try sugar free gum instead of sweets   Please follow up in 3 months   Thank you for choosing [facility].   Please call [phone] with any questions about today's appointment.  Please be sure to schedule follow up at the front  desk before you leave today.   Suzann Daring, MD  Family Medicine

## 2023-12-11 ENCOUNTER — Ambulatory Visit: Payer: Self-pay | Admitting: Family Medicine

## 2023-12-11 LAB — LIPID PANEL
Chol/HDL Ratio: 3.3 ratio (ref 0.0–5.0)
Cholesterol, Total: 124 mg/dL (ref 100–199)
HDL: 38 mg/dL — ABNORMAL LOW (ref >39)
LDL Chol Calc (NIH): 69 mg/dL (ref 0–99)
Triglycerides: 89 mg/dL (ref 0–149)
VLDL Cholesterol Cal: 17 mg/dL (ref 5–40)

## 2023-12-11 LAB — TSH RFX ON ABNORMAL TO FREE T4: TSH: 0.815 u[IU]/mL (ref 0.450–4.500)

## 2023-12-11 LAB — HEMOGLOBIN A1C
Est. average glucose Bld gHb Est-mCnc: 143 mg/dL
Hgb A1c MFr Bld: 6.6 % — ABNORMAL HIGH (ref 4.8–5.6)

## 2023-12-11 NOTE — Telephone Encounter (Signed)
 Called patient  Has had prior BG >126 and now A1C in diabetes range  Discussed diagnosis Scheduled follow up visit to discuss implications, management, starting Ozempic (Desire weight loss)   All questions answered  Suzann Daring, MD  [facility] Medicine Teaching Service

## 2023-12-20 NOTE — Progress Notes (Signed)
    SUBJECTIVE:   CHIEF COMPLAINT: new diagnosis diabetes HPI:   Shawn Perkins is a 68 y.o.  with history notable for atrial fibrillation and tobacco use (quit) presenting for follow up on labs with A1C in diabetic range .   Discussed the use of AI scribe software for clinical note transcription with the patient, who gave verbal consent to proceed.  History of Present Illness Shawn Perkins is a 68 year old male who presents for follow-up and medication management of newly diagnosed diabetes.  Glycemic control and weight gain - Newly diagnosed diabetes mellitus with slightly elevated hemoglobin A1c - Weight gain of approximately forty pounds since reducing alcohol consumption and quitting smoking - Increased consumption of high-calorie foods, specifically 'honey buns' - No family history of medullary thyroid  cancer  He is due for low dose CT but does want this until January.      PERTINENT  PMH / PSH/Family/Social History : A1C 6.6   OBJECTIVE:   BP 103/79   Pulse 91   Ht 5' 11 (1.803 m)   Wt 268 lb 9.6 oz (121.8 kg)   SpO2 98%   BMI 37.46 kg/m   Today's weight:  Last Weight  Most recent update: 12/24/2023 10:48 AM    Weight  121.8 kg (268 lb 9.6 oz)            Review of prior weights: Filed Weights   12/24/23 1048  Weight: 268 lb 9.6 oz (121.8 kg)    RRR Lungs clear bilaterally   ASSESSMENT/PLAN:   Assessment & Plan Type 2 diabetes mellitus with other specified complication, without long-term current use of insulin (HCC) Discussed complications, management, long term risks, meaning of A1C He already has significant pill burden Ozempic 0.25 mg weekly started Prescribed and sent in pen needles Discussed side effects, including AION   Consider metformin in future History of smoking Due for low dose CT  Chronic left-sided low back pain without sciatica Discussed weight management as part of managing pain--he thinks this contributes Encounter for  immunization Encounter for immunization HCM Flu and COVID given      Suzann Daring, MD  Family Medicine Teaching Service  [facility]

## 2023-12-24 ENCOUNTER — Ambulatory Visit (INDEPENDENT_AMBULATORY_CARE_PROVIDER_SITE_OTHER): Payer: Medicare (Managed Care) | Admitting: Family Medicine

## 2023-12-24 ENCOUNTER — Encounter: Payer: Self-pay | Admitting: Family Medicine

## 2023-12-24 VITALS — BP 103/79 | HR 91 | Ht 71.0 in | Wt 268.6 lb

## 2023-12-24 DIAGNOSIS — M545 Low back pain, unspecified: Secondary | ICD-10-CM

## 2023-12-24 DIAGNOSIS — E1169 Type 2 diabetes mellitus with other specified complication: Secondary | ICD-10-CM

## 2023-12-24 DIAGNOSIS — G8929 Other chronic pain: Secondary | ICD-10-CM

## 2023-12-24 DIAGNOSIS — Z87891 Personal history of nicotine dependence: Secondary | ICD-10-CM

## 2023-12-24 DIAGNOSIS — Z23 Encounter for immunization: Secondary | ICD-10-CM

## 2023-12-24 MED ORDER — SEMAGLUTIDE(0.25 OR 0.5MG/DOS) 2 MG/1.5ML ~~LOC~~ SOPN: 0.2500 mg | PEN_INJECTOR | SUBCUTANEOUS | 3 refills | Status: DC

## 2023-12-24 MED ORDER — BD PEN NEEDLE NANO 2ND GEN 32G X 4 MM MISC
1 refills | Status: AC
Start: 2023-12-24 — End: ?

## 2023-12-24 NOTE — Assessment & Plan Note (Signed)
 Discussed weight management as part of managing pain--he thinks this contributes

## 2023-12-24 NOTE — Patient Instructions (Signed)
 It was wonderful to see you today.  Please bring ALL of your medications with you to every visit.   Today we talked about:  -- Follow  up in 3 months  - I sent in Ozempic  - This is once weekly  If you have severe nausea or vomiting stop the medication   Call me if it is too expensive  Your lung cat scan is due--we can schedule for the new year   Please follow up in 3 months   Thank you for choosing [facility].   Please call [phone] with any questions about today's appointment.  Please be sure to schedule follow up at the front  desk before you leave today.   Suzann Daring, MD  Family Medicine

## 2024-01-03 ENCOUNTER — Telehealth: Payer: Self-pay

## 2024-01-03 NOTE — Telephone Encounter (Signed)
 Spoke with patient yesterday regarding issues with continued back pain. He reports that he is receiving injections in his back at [facility], however, is not due until December.   He reports that his injections are not managing his pain for the full three months and would like to get injection earlier.   He states that he spoke with the insurance and specialty office. He states that earlier injection needs to be approved by PCP.   Advised patient that I would need to look into this further, as authorization should come from specialty office as they are performing the injections.   Placed call to [facility], however, had to leave message with nurse. Will await returned call for next steps.   Chiquita JAYSON English, RN

## 2024-01-03 NOTE — Telephone Encounter (Signed)
 Agree await return call   Suzann Daring, MD  [facility] Medicine Teaching Service

## 2024-01-09 ENCOUNTER — Encounter: Payer: Self-pay | Admitting: Oncology

## 2024-01-11 ENCOUNTER — Encounter: Payer: Self-pay | Admitting: Pharmacist

## 2024-01-11 NOTE — Progress Notes (Signed)
 This patient is appearing on a report for being at risk of failing the adherence measure for cholesterol (statin) medications this calendar year.   Medication: atorvastatin  40 mg  Last fill date: 01/08/24 for 90 day supply  Chart review completed

## 2024-01-14 ENCOUNTER — Ambulatory Visit: Payer: Medicare (Managed Care) | Admitting: Endocrinology

## 2024-01-14 ENCOUNTER — Other Ambulatory Visit: Payer: Medicare (Managed Care)

## 2024-01-14 ENCOUNTER — Encounter: Payer: Self-pay | Admitting: Endocrinology

## 2024-01-14 VITALS — BP 112/70 | HR 107 | Resp 20 | Ht 71.0 in | Wt 270.0 lb

## 2024-01-14 DIAGNOSIS — E279 Disorder of adrenal gland, unspecified: Secondary | ICD-10-CM | POA: Diagnosis not present

## 2024-01-14 DIAGNOSIS — E278 Other specified disorders of adrenal gland: Secondary | ICD-10-CM | POA: Diagnosis not present

## 2024-01-14 NOTE — Progress Notes (Signed)
 Outpatient Endocrinology Note Chauntelle Azpeitia, MD   Patient's Name: Shawn Perkins    DOB: 11-Aug-1955    MRN: [id number]  REASON OF VISIT: Follow-up for an adrenal incidentaloma /left adrenal mass.  REFERRING PROVIDER: Delores Suzann HERO, MD  PCP: Delores Suzann HERO, MD  HISTORY OF PRESENT ILLNESS:   Shawn Perkins is a 68 y.o. old male with past medical history listed below, is here for follow-up of left adrenal nodule.   Pertinent Adrenal History: Patient was referred to endocrinology for further evaluation and management after diagnosis of left adrenal nodule and biochemical test with indeterminant result of 1 mg dexamethasone  overnight suppression test with cortisol level of 2.0, initial consult/visit in June 2025.  Background: Patient was incidentally found to have 3.2 cm left adrenal mass 25 [place], on CT chest lung screening on December 29, 2022.  This was not visualized on CT scan in February 15, 2012.  He had MRI abdomen January 2025 with and without contrast for evaluation of left renal mass, showed enhancing mass of left renal gland measuring 3.2 x 2.9 cm In and opposed phase imaging is unfortunately limited by significant breath motion artifact, however there is no confidently identified macroscopic fat content within this lesion. This is concerning for malignancy; although lipid poor adenoma is a possibility, differential considerations would include pheochromocytoma as well as adrenal metastasis. Consider PET-CT for metabolic characterization and tissue sampling.  In April 13, 2023 patient underwent PET CT showed 3.1 x 2.8 cm left adrenal mass low-grade metabolic activity favoring a benign etiology. The delayed enhancement the lesion accompanied by the very high T2 signal on prior MRI (for example image 19 series 3 of that exam) raise the possibility of pheochromocytoma.   -There was a plan for possible adrenal nodule biopsy however with the reassuring results of PET/CT was later  canceled and planned to repeat a CT scan in 3 months.  In Jul 04, 2023 patient had a CT adrenal/abdomen/pelvis with and without contrast showed left adrenal mass measuring 3.3 x 3.0 cm remains stable compared to December 29, 2022 but was new since 2013, shows relative and absolute contrast washout values consistent with benign adenoma.  Right adrenal gland normal.  # Biochemical evaluation of left adrenal nodule : - Patient had normal 24-hour urine metanephrines.  He had normal aldosterone and renin ratio.  1 mg dexamethasone  overnight suppression test with cortisol of 2.0.  Indeterminate, later repeat was normal with cortisol of 1.7.   Latest Reference Range & Units 03/02/23 10:04  ALDOSTERONE 0.0 - 30.0 ng/dL 3.9  DHEA-SO4 69.0 - 704.3 ug/dL 69.6 (L)  Cortisol - AM 6.2 - 19.4 ug/dL 2.0 (L)  ALDOS/RENIN RATIO 0.0 - 30.0  0.2  (L): Data is abnormally low  Latest Reference Range & Units 04/06/23 13:36  Metanephrine, Ur Undefined ug/L 40  Normetanephrine, Ur Undefined ug/L 73  Total Volume  1,450  Metanephrines, 24H Ur 58 - 276 ug/24 hr 58  Normetanephr.,U,24h 156 - 729 ug/24 hr 106 (L)  (L): Data is abnormally low  Repeat 1 mg dexamethasone  overnight suppression test was normal.   Latest Reference Range & Units 08/01/23 08:00  Cortisol, Plasma mcg/dL 1.7 (L)  (L): Data is abnormally low  -No clinical features suggestive of pheochromocytoma or hypercortisolism. He has history of hypertension, controlled with 1 antihypertensive medication.  Denies history of significantly high blood pressure.   Patient was evaluated by hematology and oncology for left adrenal nodule as well.  He has a history  of atrial fibrillation on anticoagulation and following with cardiology.  He has history of EtOH use and tobacco use.  # He has type 2 diabetes mellitus : Taking Ozempic  and Farxiga , managed by primary care provider.  Interval history: Patient presented for follow-up.  Body weight is relatively  stable.  No new symptoms.  His blood pressure is controlled.  He has tachycardia, he reports he is having back pain and probably causing increased heart rate.  Denies palpitation.  He had normal thyroid  function test last month.  No other complaints today.  REVIEW OF SYSTEMS:  As per history of present illness.   PAST MEDICAL HISTORY: Past Medical History:  Diagnosis Date   CHF (congestive heart failure) (HCC)    Diverticulosis    Heart murmur    History of cocaine abuse (HCC)    use to smoke crack cocaine, last use was more than 6 years ago   Hypertension    Rheumatic fever    as a child   Systolic heart failure    a. 11/09/10 echo EF 35%, b. 11/11/10 cath norm cors c. ECHO (11/2012) EF 35-50%   Umbilical hernia, incarcerated 11/2012   11/2012 exploratory lap with small bwl resection    PAST SURGICAL HISTORY: Past Surgical History:  Procedure Laterality Date   ATRIAL FIBRILLATION ABLATION N/A 06/13/2023   Procedure: ATRIAL FIBRILLATION ABLATION;  Surgeon: Kennyth Chew, MD;  Location: [facility] INVASIVE CV LAB;  Service: Cardiovascular;  Laterality: N/A;   BOWEL RESECTION  12/05/2012   Procedure: SMALL BOWEL RESECTION;  Surgeon: Lynda Leos, MD;  Location: [facility] OR;  Service: General;;   CARDIOVERSION N/A 11/09/2022   Procedure: CARDIOVERSION;  Surgeon: Rolan Ezra RAMAN, MD;  Location: [facility] INVASIVE CV LAB;  Service: Cardiovascular;  Laterality: N/A;   CARDIOVERSION N/A 12/01/2022   Procedure: CARDIOVERSION;  Surgeon: Rolan Ezra RAMAN, MD;  Location: [facility] INVASIVE CV LAB;  Service: Cardiovascular;  Laterality: N/A;   SMALL INTESTINE SURGERY  12/05/2012   TONSILLECTOMY     UMBILICAL HERNIA REPAIR     2014    ALLERGIES: Allergies  Allergen Reactions   Penicillins Other (See Comments)    Childhood reaction    FAMILY HISTORY:  Family History  Problem Relation Age of Onset   Kidney failure Mother    Healthy Father    Heart disease Neg Hx    Diabetes Neg Hx    Hyperlipidemia Neg Hx     COPD Neg Hx    Colon cancer Neg Hx    Rectal cancer Neg Hx    Stomach cancer Neg Hx     SOCIAL HISTORY: Social History   Socioeconomic History   Marital status: Single    Spouse name: Not on file   Number of children: Not on file   Years of education: Not on file   Highest education level: Not on file  Occupational History   Not on file  Tobacco Use   Smoking status: Former    Current packs/day: 0.00    Average packs/day: 0.1 packs/day for 43.6 years (5.5 ttl pk-yrs)    Types: Cigarettes    Start date: 02/21/1980    Quit date: 09/21/2023    Years since quitting: 0.3   Smokeless tobacco: Never  Substance and Sexual Activity   Alcohol use: Yes    Alcohol/week: 4.0 standard drinks of alcohol    Types: 4 Cans of beer per week    Comment: 12/05/2012 no more than 12 pack beer/week; haven't had anything in 3  wks   Drug use: Yes    Types: Marijuana    Comment: 12/05/2012 used to smoke weed & crack cocaine; none in over 6 years ago   Sexual activity: Not Currently  Other Topics Concern   Not on file  Social History Narrative   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), Saturdays at apartment complex    Social Drivers of Health   Financial Resource Strain: Low Risk  (03/26/2023)   Overall Financial Resource Strain (CARDIA)    Difficulty of Paying Living Expenses: Not hard at all  Food Insecurity: No Food Insecurity (03/26/2023)   Hunger Vital Sign    Worried About Running Out of Food in the Last Year: Never true    Ran Out of Food in the Last Year: Never true  Transportation Needs: No Transportation Needs (03/26/2023)   PRAPARE - Administrator, Civil Service (Medical): No    Lack of Transportation (Non-Medical): No  Physical Activity: Sufficiently Active (03/26/2023)   Exercise Vital Sign    Days of Exercise per Week: 7 days    Minutes of Exercise per Session: 60 min  Stress: No Stress Concern Present (03/26/2023)   [employer] of Occupational Health -  Occupational Stress Questionnaire    Feeling of Stress : Not at all  Social Connections: Moderately Integrated (03/26/2023)   Social Connection and Isolation Panel    Frequency of Communication with Friends and Family: More than three times a week    Frequency of Social Gatherings with Friends and Family: More than three times a week    Attends Religious Services: More than 4 times per year    Active Member of [employer] or Organizations: Yes    Attends Engineer, Structural: More than 4 times per year    Marital Status: Never married    MEDICATIONS:  Current Outpatient Medications  Medication Sig Dispense Refill   acetaminophen  (TYLENOL ) 500 MG tablet Take 1,000 mg by mouth as needed (BACK PAIN.).     apixaban  (ELIQUIS ) 5 MG TABS tablet Take 1 tablet (5 mg total) by mouth 2 (two) times daily. 180 tablet 3   atorvastatin  (LIPITOR) 40 MG tablet Take 1 tablet (40 mg total) by mouth daily. 90 tablet 3   Blood Pressure Monitoring (BLOOD PRESSURE CUFF) MISC Please check and monitor blood pressure daily 1 each 0   carvedilol  (COREG ) 12.5 MG tablet Take 1 tablet (12.5 mg total) by mouth 2 (two) times daily. 180 tablet 3   dexamethasone  (DECADRON ) 1 MG tablet Take 1 tablet by mouth once at 11 pm and have lab for cortisol next morning at 8 AM. 1 tablet 0   FARXIGA  10 MG TABS tablet TAKE 1 TABLET BY MOUTH DAILY BEFORE BREAKFAST. 90 tablet 1   furosemide  (LASIX ) 40 MG tablet Take 1 tablet (40 mg total) by mouth as needed (AS NEEDED FOR SWELLING OR WEIGHT GAIN 3 POUNDS OVER NIGHT OR 5 POUNDS IN 1 WEEK). 90 tablet 1   Insulin Pen Needle (BD PEN NEEDLE NANO 2ND GEN) 32G X 4 MM MISC Use to inject once weekly 90 each 1   KLOR-CON  M20 20 MEQ tablet TAKE 1 TABLET (20 MEQ TOTAL) BY MOUTH DAILY AS NEEDED ON THE DAYS THAT YOU TAKE LASIX  (FUROSEMIDE ) 90 tablet 1   losartan  (COZAAR ) 25 MG tablet Take 25 mg by mouth daily.     Semaglutide ,0.25 or 0.5MG /DOS, 2 MG/1.5ML SOPN Inject 0.25 mg into the skin once a  week. 0.25 mg once  weekly 3 mL 3   vitamin B-12 (CYANOCOBALAMIN ) 1000 MCG tablet Take 1 tablet (1,000 mcg total) by mouth daily. 90 tablet 3   No current facility-administered medications for this visit.    PHYSICAL EXAM: Vitals:   01/14/24 0807  BP: 112/70  Pulse: (!) 107  Resp: 20  SpO2: 96%  Weight: 270 lb (122.5 kg)  Height: 5' 11 (1.803 m)   Body mass index is 37.66 kg/m.  Wt Readings from Last 3 Encounters:  01/14/24 270 lb (122.5 kg)  12/24/23 268 lb 9.6 oz (121.8 kg)  12/10/23 272 lb 9.6 oz (123.7 kg)    General: Well developed, well nourished male in no apparent distress. No cushingoid appearance HEENT: AT/[place], no external lesions. Hearing intact to the spoken word Eyes: EOMI. Conjunctiva clear and no icterus. Neck: Trachea midline, neck supple without appreciable thyromegaly or lymphadenopathy and no palpable thyroid  nodules Abdomen: Soft, non tender, non distended, no striae.  Midline anterior abdominal wall scar with bulging of the anterior abdomen wall, ?  hernia. Neurologic: Alert, oriented, normal speech, deep tendon biceps reflexes normal,  no gross focal neurological deficit Extremities: No pedal pitting edema, no tremors of outstretched hands Skin: Warm, color good.  No bruises. Psychiatric: Does not appear depressed or anxious  PERTINENT HISTORIC LABORATORY AND IMAGING STUDIES:  All pertinent laboratory results were reviewed. Please see HPI for further details.  Lab Results  Component Value Date   CO2 25 07/18/2023   CL 110 07/18/2023   NA 140 07/18/2023   GLUCOSE 104 (H) 07/18/2023   BUN 16 07/18/2023   No components found for: CORTRAND, CORTISOL TOTAL AM, ALDOSTERONE, RENIN ACTIVITY, DEHYDROEPIANDROSTERONE SULFATE, CATECHOLAMINES FRACTIONATED   ASSESSMENT / PLAN  1. Left adrenal mass   2. Lesion of adrenal gland     Shawn Perkins is a 68 y.o. old male who was incidentally found to have a 3.2 cm left  adrenal nodule in CT chest done  for evaluation of cancer screening due to having history of cigarette smoking.  Patient later had MRI abdomen, PET CT and CT adrenal with left adrenal nodule measuring 3.2 cm features consistent with benign adrenal nodule.  This nodule was stable on CT scan in May 2025 compared to CT scan in November 2024.  This nodule is new compared to CT scan in 2013. - Biochemical test with normal aldosterone, renin and 24-hour urine metanephrines.  He had indeterminate result of 1 mg overnight dexamethasone  suppression test with cortisol of 2.0, normal response < 1.8, repeat was normal with cortisol response of 1.7. - Patient has hypertension controlled with 1 antihypertensive medication. Patient has no obvious clinical features suggestive of hypercortisolism or pheochromocytoma or hyperaldosteronism.  Plan: -Check plasma metanephrine and cortisol AM today. -Consider repeating CT adrenal in 1 to 2 years to monitor adrenal nodule. - Will plan for annual biochemical monitoring for adrenal nodule. - Annual endocrinology follow-up.  Diagnoses and all orders for this visit:  Left adrenal mass -     Metanephrines, plasma -     Cortisol  Lesion of adrenal gland     DISPOSITION Follow up in clinic in 12 months suggested.  Labs today.  All questions answered and patient verbalized understanding of the plan.  Braidyn Peace, MD [facility] Endocrinology [facility] [address] [place], [address] [place], [place] [address] Phone # [phone] t least part of this note was generated using voice recognition software. Inadvertent word errors may have occurred, which were not recognized during  the proofreading process.

## 2024-01-16 ENCOUNTER — Other Ambulatory Visit: Payer: Self-pay | Admitting: Endocrinology

## 2024-01-20 LAB — METANEPHRINES, PLASMA
Metanephrine, Free: 95 pg/mL — ABNORMAL HIGH (ref <=57)
Normetanephrine, Free: 211 pg/mL — ABNORMAL HIGH (ref <=148)
Total Metanephrines-Plasma: 306 pg/mL — ABNORMAL HIGH (ref <=205)

## 2024-01-20 LAB — CORTISOL: Cortisol, Plasma: 12.6 ug/dL

## 2024-01-21 ENCOUNTER — Ambulatory Visit: Payer: Self-pay | Admitting: Endocrinology

## 2024-01-21 DIAGNOSIS — E279 Disorder of adrenal gland, unspecified: Secondary | ICD-10-CM

## 2024-01-24 DIAGNOSIS — M47816 Spondylosis without myelopathy or radiculopathy, lumbar region: Secondary | ICD-10-CM | POA: Diagnosis not present

## 2024-01-25 ENCOUNTER — Encounter: Payer: Self-pay | Admitting: Pharmacist

## 2024-01-25 NOTE — Progress Notes (Signed)
 This patient is appearing on a report for being at risk of failing the adherence measure for cholesterol (statin) medications this calendar year.   Medication: atorvastatin  Last fill date: 01/08/24 for 90 day supply  Reviewed medication indication, dosing, and goals of therapy.  Pass 89% for 2025

## 2024-01-29 ENCOUNTER — Other Ambulatory Visit: Payer: Medicare (Managed Care)

## 2024-02-05 ENCOUNTER — Other Ambulatory Visit: Payer: Self-pay

## 2024-02-05 ENCOUNTER — Encounter (HOSPITAL_COMMUNITY): Payer: Self-pay | Admitting: Emergency Medicine

## 2024-02-05 ENCOUNTER — Inpatient Hospital Stay (HOSPITAL_COMMUNITY)
Admission: EM | Admit: 2024-02-05 | Discharge: 2024-02-07 | DRG: 291 | Disposition: A | Discharge status: Home / Self Care | Payer: Medicare (Managed Care)

## 2024-02-05 ENCOUNTER — Emergency Department (HOSPITAL_COMMUNITY): Payer: Medicare (Managed Care)

## 2024-02-05 DIAGNOSIS — E785 Hyperlipidemia, unspecified: Secondary | ICD-10-CM | POA: Diagnosis present

## 2024-02-05 DIAGNOSIS — I484 Atypical atrial flutter: Secondary | ICD-10-CM | POA: Diagnosis present

## 2024-02-05 DIAGNOSIS — I10 Essential (primary) hypertension: Secondary | ICD-10-CM

## 2024-02-05 DIAGNOSIS — E538 Deficiency of other specified B group vitamins: Secondary | ICD-10-CM | POA: Diagnosis present

## 2024-02-05 DIAGNOSIS — I493 Ventricular premature depolarization: Secondary | ICD-10-CM | POA: Diagnosis present

## 2024-02-05 DIAGNOSIS — Z8419 Family history of other disorders of kidney and ureter: Secondary | ICD-10-CM | POA: Diagnosis not present

## 2024-02-05 DIAGNOSIS — J439 Emphysema, unspecified: Secondary | ICD-10-CM | POA: Diagnosis present

## 2024-02-05 DIAGNOSIS — E875 Hyperkalemia: Secondary | ICD-10-CM | POA: Diagnosis present

## 2024-02-05 DIAGNOSIS — Z87891 Personal history of nicotine dependence: Secondary | ICD-10-CM | POA: Diagnosis not present

## 2024-02-05 DIAGNOSIS — Z7985 Long-term (current) use of injectable non-insulin antidiabetic drugs: Secondary | ICD-10-CM

## 2024-02-05 DIAGNOSIS — I5033 Acute on chronic diastolic (congestive) heart failure: Secondary | ICD-10-CM | POA: Diagnosis present

## 2024-02-05 DIAGNOSIS — I428 Other cardiomyopathies: Secondary | ICD-10-CM | POA: Diagnosis not present

## 2024-02-05 DIAGNOSIS — I4891 Unspecified atrial fibrillation: Secondary | ICD-10-CM | POA: Diagnosis not present

## 2024-02-05 DIAGNOSIS — I420 Dilated cardiomyopathy: Secondary | ICD-10-CM | POA: Diagnosis present

## 2024-02-05 DIAGNOSIS — Z7984 Long term (current) use of oral hypoglycemic drugs: Secondary | ICD-10-CM | POA: Diagnosis not present

## 2024-02-05 DIAGNOSIS — I480 Paroxysmal atrial fibrillation: Secondary | ICD-10-CM | POA: Diagnosis not present

## 2024-02-05 DIAGNOSIS — D696 Thrombocytopenia, unspecified: Secondary | ICD-10-CM | POA: Diagnosis present

## 2024-02-05 DIAGNOSIS — I110 Hypertensive heart disease with heart failure: Principal | ICD-10-CM | POA: Diagnosis present

## 2024-02-05 DIAGNOSIS — D7589 Other specified diseases of blood and blood-forming organs: Secondary | ICD-10-CM | POA: Diagnosis present

## 2024-02-05 DIAGNOSIS — G8929 Other chronic pain: Secondary | ICD-10-CM | POA: Diagnosis present

## 2024-02-05 DIAGNOSIS — M545 Low back pain, unspecified: Secondary | ICD-10-CM | POA: Diagnosis present

## 2024-02-05 DIAGNOSIS — Z79899 Other long term (current) drug therapy: Secondary | ICD-10-CM

## 2024-02-05 DIAGNOSIS — Z789 Other specified health status: Secondary | ICD-10-CM

## 2024-02-05 DIAGNOSIS — Z7901 Long term (current) use of anticoagulants: Secondary | ICD-10-CM | POA: Diagnosis not present

## 2024-02-05 DIAGNOSIS — I951 Orthostatic hypotension: Secondary | ICD-10-CM | POA: Diagnosis present

## 2024-02-05 DIAGNOSIS — Z880 Allergy status to penicillin: Secondary | ICD-10-CM | POA: Diagnosis not present

## 2024-02-05 DIAGNOSIS — Z7951 Long term (current) use of inhaled steroids: Secondary | ICD-10-CM

## 2024-02-05 DIAGNOSIS — J441 Chronic obstructive pulmonary disease with (acute) exacerbation: Secondary | ICD-10-CM

## 2024-02-05 DIAGNOSIS — Z1152 Encounter for screening for COVID-19: Secondary | ICD-10-CM

## 2024-02-05 DIAGNOSIS — I5022 Chronic systolic (congestive) heart failure: Secondary | ICD-10-CM | POA: Diagnosis present

## 2024-02-05 DIAGNOSIS — I517 Cardiomegaly: Secondary | ICD-10-CM | POA: Diagnosis not present

## 2024-02-05 DIAGNOSIS — R918 Other nonspecific abnormal finding of lung field: Secondary | ICD-10-CM | POA: Diagnosis not present

## 2024-02-05 DIAGNOSIS — I509 Heart failure, unspecified: Secondary | ICD-10-CM | POA: Diagnosis not present

## 2024-02-05 DIAGNOSIS — R0602 Shortness of breath: Secondary | ICD-10-CM | POA: Diagnosis not present

## 2024-02-05 DIAGNOSIS — I5041 Acute combined systolic (congestive) and diastolic (congestive) heart failure: Secondary | ICD-10-CM | POA: Diagnosis not present

## 2024-02-05 LAB — CBC WITH DIFFERENTIAL/PLATELET
Abs Immature Granulocytes: 0.04 K/uL (ref 0.00–0.07)
Basophils Absolute: 0 K/uL (ref 0.0–0.1)
Basophils Relative: 0 %
Eosinophils Absolute: 0 K/uL (ref 0.0–0.5)
Eosinophils Relative: 0 %
HCT: 47.1 % (ref 39.0–52.0)
Hemoglobin: 15 g/dL (ref 13.0–17.0)
Immature Granulocytes: 0 %
Lymphocytes Relative: 13 %
Lymphs Abs: 1.3 K/uL (ref 0.7–4.0)
MCH: 33.4 pg (ref 26.0–34.0)
MCHC: 31.8 g/dL (ref 30.0–36.0)
MCV: 104.9 fL — ABNORMAL HIGH (ref 80.0–100.0)
Monocytes Absolute: 0.5 K/uL (ref 0.1–1.0)
Monocytes Relative: 5 %
Neutro Abs: 7.6 K/uL (ref 1.7–7.7)
Neutrophils Relative %: 82 %
Platelets: 140 K/uL — ABNORMAL LOW (ref 150–400)
RBC: 4.49 MIL/uL (ref 4.22–5.81)
RDW: 15.5 % (ref 11.5–15.5)
WBC: 9.5 K/uL (ref 4.0–10.5)
nRBC: 0.2 % (ref 0.0–0.2)

## 2024-02-05 LAB — HEPATIC FUNCTION PANEL
ALT: 29 U/L (ref 0–44)
AST: 14 U/L — ABNORMAL LOW (ref 15–41)
Albumin: 3.5 g/dL (ref 3.5–5.0)
Alkaline Phosphatase: 74 U/L (ref 38–126)
Bilirubin, Direct: 0.5 mg/dL — ABNORMAL HIGH (ref 0.0–0.2)
Indirect Bilirubin: 1 mg/dL — ABNORMAL HIGH (ref 0.3–0.9)
Total Bilirubin: 1.5 mg/dL — ABNORMAL HIGH (ref 0.0–1.2)
Total Protein: 5.7 g/dL — ABNORMAL LOW (ref 6.5–8.1)

## 2024-02-05 LAB — BASIC METABOLIC PANEL WITH GFR
Anion gap: 13 (ref 5–15)
BUN: 16 mg/dL (ref 8–23)
CO2: 19 mmol/L — ABNORMAL LOW (ref 22–32)
Calcium: 9.2 mg/dL (ref 8.9–10.3)
Chloride: 112 mmol/L — ABNORMAL HIGH (ref 98–111)
Creatinine, Ser: 1.12 mg/dL (ref 0.61–1.24)
GFR, Estimated: >60 mL/min (ref >60)
Glucose, Bld: 84 mg/dL (ref 70–99)
Potassium: 4.3 mmol/L (ref 3.5–5.1)
Sodium: 144 mmol/L (ref 135–145)

## 2024-02-05 LAB — TROPONIN T, HIGH SENSITIVITY
Troponin T High Sensitivity: 21 ng/L — ABNORMAL HIGH (ref 0–19)
Troponin T High Sensitivity: <15 ng/L (ref 0–19)

## 2024-02-05 LAB — PRO BRAIN NATRIURETIC PEPTIDE: Pro Brain Natriuretic Peptide: 1876 pg/mL — ABNORMAL HIGH (ref <300.0)

## 2024-02-05 LAB — RESP PANEL BY RT-PCR (RSV, FLU A&B, COVID)  RVPGX2
Influenza A by PCR: NEGATIVE
Influenza B by PCR: NEGATIVE
Resp Syncytial Virus by PCR: NEGATIVE
SARS Coronavirus 2 by RT PCR: NEGATIVE

## 2024-02-05 LAB — CBG MONITORING, ED: Glucose-Capillary: 108 mg/dL — ABNORMAL HIGH (ref 70–99)

## 2024-02-05 LAB — HIV ANTIBODY (ROUTINE TESTING W REFLEX): HIV Screen 4th Generation wRfx: NONREACTIVE

## 2024-02-05 LAB — TSH: TSH: 0.47 u[IU]/mL (ref 0.350–4.500)

## 2024-02-05 LAB — MAGNESIUM: Magnesium: 1.6 mg/dL — ABNORMAL LOW (ref 1.7–2.4)

## 2024-02-05 MED ORDER — ATORVASTATIN CALCIUM 40 MG PO TABS
40.0000 mg | ORAL_TABLET | Freq: Every day | ORAL | Status: DC
  Administered 2024-02-06 – 2024-02-07 (×2): 40 mg via ORAL
  Filled 2024-02-05 (×2): qty 1

## 2024-02-05 MED ORDER — PROPOFOL 10 MG/ML IV BOLUS
INTRAVENOUS | Status: AC | PRN
  Administered 2024-02-05 (×2): 30 mg via INTRAVENOUS

## 2024-02-05 MED ORDER — SODIUM CHLORIDE 0.9% FLUSH
3.0000 mL | Freq: Two times a day (BID) | INTRAVENOUS | Status: DC
  Administered 2024-02-05 – 2024-02-06 (×2): 3 mL via INTRAVENOUS

## 2024-02-05 MED ORDER — DILTIAZEM LOAD VIA INFUSION
20.0000 mg | Freq: Once | INTRAVENOUS | Status: DC
  Filled 2024-02-05: qty 20

## 2024-02-05 MED ORDER — ACETAMINOPHEN 500 MG PO TABS
1000.0000 mg | ORAL_TABLET | ORAL | Status: DC | PRN
  Administered 2024-02-05 – 2024-02-06 (×2): 1000 mg via ORAL
  Filled 2024-02-05 (×2): qty 2

## 2024-02-05 MED ORDER — VITAMIN B-12 1000 MCG PO TABS
1000.0000 ug | ORAL_TABLET | Freq: Every day | ORAL | Status: DC
  Administered 2024-02-06 – 2024-02-07 (×2): 1000 ug via ORAL
  Filled 2024-02-05 (×2): qty 1

## 2024-02-05 MED ORDER — DILTIAZEM HCL-DEXTROSE 125-5 MG/125ML-% IV SOLN (PREMIX): 5.0000 mg/h | INTRAVENOUS | Status: DC

## 2024-02-05 MED ORDER — LEVALBUTEROL HCL 1.25 MG/0.5ML IN NEBU
1.2500 mg | INHALATION_SOLUTION | Freq: Once | RESPIRATORY_TRACT | Status: AC
  Administered 2024-02-05: 10:00:00 1.25 mg via RESPIRATORY_TRACT
  Filled 2024-02-05: qty 0.5

## 2024-02-05 MED ORDER — DAPAGLIFLOZIN PROPANEDIOL 10 MG PO TABS
10.0000 mg | ORAL_TABLET | Freq: Every day | ORAL | Status: DC
  Administered 2024-02-06 – 2024-02-07 (×2): 10 mg via ORAL
  Filled 2024-02-05 (×2): qty 1

## 2024-02-05 MED ORDER — APIXABAN 5 MG PO TABS
5.0000 mg | ORAL_TABLET | Freq: Two times a day (BID) | ORAL | Status: DC
  Administered 2024-02-05 – 2024-02-07 (×4): 5 mg via ORAL
  Filled 2024-02-05 (×4): qty 1

## 2024-02-05 MED ORDER — MAGNESIUM SULFATE 2 GM/50ML IV SOLN
2.0000 g | Freq: Once | INTRAVENOUS | Status: AC
  Administered 2024-02-05: 17:00:00 2 g via INTRAVENOUS
  Filled 2024-02-05: qty 50

## 2024-02-05 MED ORDER — CARVEDILOL 12.5 MG PO TABS
12.5000 mg | ORAL_TABLET | Freq: Two times a day (BID) | ORAL | Status: DC
  Administered 2024-02-05 – 2024-02-06 (×3): 12.5 mg via ORAL
  Filled 2024-02-05 (×3): qty 1

## 2024-02-05 MED ORDER — LOSARTAN POTASSIUM 50 MG PO TABS
25.0000 mg | ORAL_TABLET | Freq: Every day | ORAL | Status: DC
  Administered 2024-02-06: 10:00:00 25 mg via ORAL
  Filled 2024-02-05: qty 1

## 2024-02-05 MED ORDER — FUROSEMIDE 10 MG/ML IJ SOLN
40.0000 mg | Freq: Once | INTRAMUSCULAR | Status: AC
  Administered 2024-02-05: 20:00:00 40 mg via INTRAVENOUS
  Filled 2024-02-05: qty 4

## 2024-02-05 MED ORDER — PROPOFOL 10 MG/ML IV BOLUS: 0.5000 mg/kg | Freq: Once | INTRAVENOUS | Status: DC

## 2024-02-05 MED ORDER — FUROSEMIDE 10 MG/ML IJ SOLN
40.0000 mg | Freq: Once | INTRAMUSCULAR | Status: AC
  Administered 2024-02-05: 14:00:00 40 mg via INTRAVENOUS
  Filled 2024-02-05: qty 4

## 2024-02-05 NOTE — ED Notes (Signed)
 CCMD called and verified patient on cardiac telemetry

## 2024-02-05 NOTE — Progress Notes (Signed)
 Standby for Cardioversion Prior to it on 2 L [place], HR 133, ETCO2 22, RR 18, BP 140/112, RR 18 97% sat.  Post cardioversion patient stable increased to 4 L [place] sat 94%, RR 22, HR 108, BP 106/92, ETCO2 18. Patient starting to respond to stimulation, name being called.

## 2024-02-05 NOTE — ED Triage Notes (Signed)
 PT BIB [facility] from home for exertional and positional SOB with productive cough.  Expiratory wheezing resolved with duoneb and solu medrol.    Hx of afib, COPD, CHF.   128/90, HR 125 aflutter, 92% RA, RA at baseline, RR 24, CBG 125,   20G L.H.

## 2024-02-05 NOTE — ED Provider Notes (Signed)
 [facility] AT [facility] Provider Note   CSN: [id number] Arrival date & time: 02/05/24  9143     Patient presents with: Shortness of Breath   Shawn Perkins is a 68 y.o. male.   Pt is a 68 yo male with pmhx significant for for afib (on Eliquis ), COPD, CHF, DM2, HLD, HTN, hx rheumatic fever as a child.  Pt presents to the ED today with SOB and cough.  Sx started last night.  EMS did give him 1 neb and 125 mg solumedrol en route.  Pt is feeling slightly better.  Pt is in afib now.  He does not know if he's usually in sinus or af.         Prior to Admission medications  Medication Sig Start Date End Date Taking? Authorizing Provider  acetaminophen  (TYLENOL ) 500 MG tablet Take 1,000 mg by mouth as needed (BACK PAIN.).    [provider]  apixaban  (ELIQUIS ) 5 MG TABS tablet Take 1 tablet (5 mg total) by mouth 2 (two) times daily. 03/02/23   Delores Suzann HERO, MD  atorvastatin  (LIPITOR) 40 MG tablet Take 1 tablet (40 mg total) by mouth daily. 01/31/23   Delores Suzann HERO, MD  Blood Pressure Monitoring (BLOOD PRESSURE CUFF) MISC Please check and monitor blood pressure daily 02/19/23   [place], [place], FNP  carvedilol  (COREG ) 12.5 MG tablet Take 1 tablet (12.5 mg total) by mouth 2 (two) times daily. 07/11/23   Delores Suzann HERO, MD  dexamethasone  (DECADRON ) 1 MG tablet Take 1 tablet by mouth once at 11 pm and have lab for cortisol next morning at 8 AM. 07/11/23   Thapa, Sudan, MD  FARXIGA  10 MG TABS tablet TAKE 1 TABLET BY MOUTH DAILY BEFORE BREAKFAST. 10/09/23   [place], Harlene HERO, FNP  furosemide  (LASIX ) 40 MG tablet Take 1 tablet (40 mg total) by mouth as needed (AS NEEDED FOR SWELLING OR WEIGHT GAIN 3 POUNDS OVER NIGHT OR 5 POUNDS IN 1 WEEK). 04/19/23   Miles, Stanton, FNP  Insulin Pen Needle (BD PEN NEEDLE NANO 2ND GEN) 32G X 4 MM MISC Use to inject once weekly 12/24/23   Delores Suzann HERO, MD  KLOR-CON  M20 20 MEQ tablet TAKE 1 TABLET (20 MEQ TOTAL) BY  MOUTH DAILY AS NEEDED ON THE DAYS THAT YOU TAKE LASIX  (FUROSEMIDE ) 04/03/23   Bensimhon, Toribio SAUNDERS, MD  losartan  (COZAAR ) 25 MG tablet Take 25 mg by mouth daily.    [provider]  Semaglutide ,0.25 or 0.5MG /DOS, 2 MG/1.5ML SOPN Inject 0.25 mg into the skin once a week. 0.25 mg once weekly 12/24/23   Delores Suzann HERO, MD  vitamin B-12 (CYANOCOBALAMIN ) 1000 MCG tablet Take 1 tablet (1,000 mcg total) by mouth daily. 01/12/21   Delores Suzann HERO, MD    Allergies: Penicillins    Review of Systems  Respiratory:  Positive for cough and shortness of breath.   All other systems reviewed and are negative.   Updated Vital Signs BP (!) 133/103   Pulse (!) 102   Temp 97.6 F (36.4 C) (Oral)   Resp (!) 30   Wt 120 kg   SpO2 93%   BMI 36.90 kg/m   Physical Exam Vitals and nursing note reviewed.  Constitutional:      Appearance: Normal appearance. He is obese.  HENT:     Head: Normocephalic and atraumatic.     Right Ear: External ear normal.     Left Ear: External ear normal.  Nose: Nose normal.     Mouth/Throat:     Mouth: Mucous membranes are dry.  Eyes:     Extraocular Movements: Extraocular movements intact.     Conjunctiva/sclera: Conjunctivae normal.     Pupils: Pupils are equal, round, and reactive to light.  Cardiovascular:     Rate and Rhythm: Tachycardia present. Rhythm irregular.     Pulses: Normal pulses.     Heart sounds: Normal heart sounds.  Pulmonary:     Effort: Pulmonary effort is normal.     Breath sounds: Normal breath sounds.  Abdominal:     General: Abdomen is flat. Bowel sounds are normal.     Palpations: Abdomen is soft.  Musculoskeletal:        General: Normal range of motion.     Cervical back: Normal range of motion and neck supple.  Skin:    General: Skin is warm.     Capillary Refill: Capillary refill takes less than 2 seconds.  Neurological:     General: No focal deficit present.     Mental Status: He is alert and oriented to person,  place, and time.     (all labs ordered are listed, but only abnormal results are displayed) Labs Reviewed  BASIC METABOLIC PANEL WITH GFR - Abnormal; Notable for the following components:      Result Value   Chloride 112 (*)    CO2 19 (*)    All other components within normal limits  CBC WITH DIFFERENTIAL/PLATELET - Abnormal; Notable for the following components:   MCV 104.9 (*)    Platelets 140 (*)    All other components within normal limits  PRO BRAIN NATRIURETIC PEPTIDE - Abnormal; Notable for the following components:   Pro Brain Natriuretic Peptide 1,876.0 (*)    All other components within normal limits  TROPONIN T, HIGH SENSITIVITY - Abnormal; Notable for the following components:   Troponin T High Sensitivity 21 (*)    All other components within normal limits  RESP PANEL BY RT-PCR (RSV, FLU A&B, COVID)  RVPGX2  TROPONIN T, HIGH SENSITIVITY    EKG: EKG Interpretation Date/Time:  Tuesday February 05 2024 13:03:30 EST Ventricular Rate:  95 PR Interval:  176 QRS Duration:  105 QT Interval:  375 QTC Calculation: 472 R Axis:   -30  Text Interpretation: Sinus rhythm Multiple premature complexes, vent & supraven Left atrial enlargement Left axis deviation Posterior infarct, old Borderline T abnormalities, inferior leads No significant change since last tracing Confirmed by Dean Clarity [phone]) on 02/05/2024 1:05:52 PM  Radiology: [facility] Chest Port 1 View Result Date: 02/05/2024 EXAM: 1 VIEW(S) XRAY OF THE CHEST 02/05/2024 09:20:00 AM COMPARISON: 09/27/2022 CLINICAL HISTORY: sob FINDINGS: LUNGS AND PLEURA: Mild diffuse interstitial opacities. No pleural effusion. No pneumothorax. HEART AND MEDIASTINUM: Cardiomegaly. BONES AND SOFT TISSUES: No acute osseous abnormality. IMPRESSION: 1. Mild diffuse interstitial opacities. 2. Cardiomegaly. Electronically signed by: Evalene Coho MD 02/05/2024 09:25 AM EST RP Workstation: [id number]     .Sedation  Date/Time: 02/05/2024  1:01 PM  Performed by: Dean Clarity, MD Authorized by: Dean Clarity, MD   Consent:    Consent obtained:  Written   Consent given by:  Patient Universal protocol:    Procedure explained and questions answered to patient or proxy's satisfaction: yes     Relevant documents present and verified: yes     Test results available: yes     Imaging studies available: yes     Required blood products, implants, devices, and special equipment  available: yes     Immediately prior to procedure, a time out was called: yes     Patient identity confirmed:  Verbally with patient Indications:    Procedure performed:  Cardioversion   Procedure necessitating sedation performed by:  Physician performing sedation Pre-sedation assessment:    Time since last food or drink:  5   ASA classification: class 3 - patient with severe systemic disease     Mallampati score:  III - soft palate, base of uvula visible   Pre-sedation assessments completed and reviewed: airway patency, cardiovascular function, hydration status, mental status, nausea/vomiting, pain level, respiratory function and temperature   A pre-sedation assessment was completed prior to the start of the procedure Immediate pre-procedure details:    Reassessment: Patient reassessed immediately prior to procedure     Verified: bag valve mask available, emergency equipment available, intubation equipment available, IV patency confirmed, oxygen available and suction available   Procedure details (see MAR for exact dosages):    Preoxygenation:  Nasal cannula   Sedation:  Propofol    Intended level of sedation: deep   Analgesia:  None   Intra-procedure monitoring:  Blood pressure monitoring, cardiac monitor, continuous capnometry, continuous pulse oximetry, frequent LOC assessments and frequent vital sign checks   Intra-procedure events: none     Total Provider sedation time (minutes):  30 Post-procedure details:   A post-sedation assessment was  completed following the completion of the procedure.   Attendance: Constant attendance by certified staff until patient recovered     Recovery: Patient returned to pre-procedure baseline     Post-sedation assessments completed and reviewed: airway patency, cardiovascular function, hydration status, mental status, nausea/vomiting, pain level, respiratory function and temperature     Patient is stable for discharge or admission: yes     Procedure completion:  Tolerated well, no immediate complications .Cardioversion  Date/Time: 02/05/2024 1:03 PM  Performed by: Dean Clarity, MD Authorized by: Dean Clarity, MD   Consent:    Consent obtained:  Written   Consent given by:  Patient   Alternatives discussed:  No treatment Universal protocol:    Procedure explained and questions answered to patient or proxy's satisfaction: yes     Patient identity confirmed:  Verbally with patient Pre-procedure details:    Cardioversion basis:  Emergent   Rhythm:  Atrial fibrillation   Electrode placement:  Anterior-posterior Patient sedated: Yes. Refer to sedation procedure documentation for details of sedation.  Attempt one:    Shock (Joules):  200   Shock outcome:  Conversion to normal sinus rhythm Post-procedure details:    Patient status:  Awake   Patient tolerance of procedure:  Tolerated well, no immediate complications    Medications Ordered in the ED  furosemide  (LASIX ) injection 40 mg (has no administration in time range)  propofol  (DIPRIVAN ) 10 mg/mL bolus/IV push 60 mg (has no administration in time range)  levalbuterol  (XOPENEX ) nebulizer solution 1.25 mg (1.25 mg Nebulization Given 02/05/24 0936)  propofol  (DIPRIVAN ) 10 mg/mL bolus/IV push (30 mg Intravenous Given 02/05/24 1246)                                    Medical Decision Making Amount and/or Complexity of Data Reviewed Labs: ordered. Radiology: ordered.  Risk Prescription drug management. Decision regarding  hospitalization.   This patient presents to the ED for concern of sob, this involves an extensive number of treatment options, and is a complaint that  carries with it a high risk of complications and morbidity.  The differential diagnosis includes copd exac, chf exac, pulm, covid/flu/rsv   Co morbidities that complicate the patient evaluation  afib (on Eliquis ), COPD, CHF, DM2, HLD, HTN, hx rheumatic fever as a child   Additional history obtained:  Additional history obtained from epic chart review External records from outside source obtained and reviewed including EMS report   Lab Tests:  I Ordered, and personally interpreted labs.  The pertinent results include:  cbc nl other than plt low at 140 (158 in May, but 142 in August); bmp nl other than CO low at 19; BNP elevated at 1876; trop sl elevated at 21; covid/flu/rsv neg   Imaging Studies ordered:  I ordered imaging studies including cxr  I independently visualized and interpreted imaging which showed  Mild diffuse interstitial opacities.  2. Cardiomegaly.   I agree with the radiologist interpretation   Cardiac Monitoring:  The patient was maintained on a cardiac monitor.  I personally viewed and interpreted the cardiac monitored which showed an underlying rhythm of: afib with rvr   Medicines ordered and prescription drug management:  I ordered medication including xopenex /cardizem   for sx  Reevaluation of the patient after these medicines showed that the patient improved I have reviewed the patients home medicines and have made adjustments as needed  Critical Interventions:  oxygen   Consultations Obtained:  I requested consultation with cards,  and discussed lab and imaging findings as well as pertinent plan - they recommend cardioversion Pt d/w FP for admission   Problem List / ED Course:  Afib with rvr:  pt can't tell if he's in afib or not.  He was in sinus at his last cardiology note and there is  documentation that he's not had AF burden s/p ablation.  He denies any missed doses of Eliquis .  Cards consulted and agree with attempt to cardiovert.  Pt consents.  CV went well and he's back in NSR and PACs/PVCs. CHF/COPD:  SOB has improved after CV, but he is still sob.  I think obs overnight will be helpful.     Reevaluation:  After the interventions noted above, I reevaluated the patient and found that they have :improved   Social Determinants of Health:  Lives at home   Dispostion:  After consideration of the diagnostic results and the patients response to treatment, I feel that the patent would benefit from admission.  CRITICAL CARE Performed by: Mliss Boyers   Total critical care time: 30 minutes  Critical care time was exclusive of separately billable procedures and treating other patients.  Critical care was necessary to treat or prevent imminent or life-threatening deterioration.  Critical care was time spent personally by me on the following activities: development of treatment plan with patient and/or surrogate as well as nursing, discussions with consultants, evaluation of patient's response to treatment, examination of patient, obtaining history from patient or surrogate, ordering and performing treatments and interventions, ordering and review of laboratory studies, ordering and review of radiographic studies, pulse oximetry and re-evaluation of patient's condition.        Final diagnoses:  Atrial fibrillation with RVR (HCC)  Acute congestive heart failure, unspecified heart failure type [facility])  COPD exacerbation [facility])    ED Discharge Orders     None          Boyers Mliss, MD 02/05/24 1308

## 2024-02-05 NOTE — Assessment & Plan Note (Addendum)
 Dyspnea x 2 weeks w/ acute worsening over the last 48 hours. S/p successful cardioversion for Afib w/ RvR in the ED. BNP 1876. Trop borderline and trending. Appears to be at dry weight (~121kg)  -Admit to FMTS, progressive floor, attending Dr Delores -Continuous cardiac monitoring -Pulse ox w/ vitals -Lasix  40mg  IV once -Cardiology consulted, appreciate their recs -Echo pending -PM EKG @ 1700 -Continue home carvedilol  12.5 mg daily -Continue home eliquis  5mg  BID -Strict I/Os -Daily weights -PT/OT eval and treat -TSH pending  -AM BMP

## 2024-02-05 NOTE — H&P (Addendum)
 Hospital Admission History and Physical Service Pager: [phone]  Patient name: Shawn Perkins Medical record number: [id number] Date of Birth: 1956/01/19 Age: 68 y.o. Gender: male  Primary Care Provider: Delores Suzann HERO, MD Consultants: Cardiology  Code Status: Full Code  Preferred Emergency Contact:  Contact Information     Name Relation Home Work Mobile   Covalt,Myeishe Daughter   [phone]      Other Contacts   None on File      Chief Complaint: Dyspnea  Assessment and Plan: Shawn Perkins is a 68 y.o. male presenting with CP and shortness of breath . Differential for presentation of this includes:  Acute CHF exacerbation (primary reason for admission, BNP elevated, CXR with increased interstitial markings, abnormal lung exam with abdominal distension without fluid wave, no significant peripheral swelling appreciated) Afib w/ RVR (likely co-occurring with the above, hx of multiple ablations and cardioversions previously, adherent to home anticoagulation) ACS (trop borderline, will follow repeat, EKG without signs of acute ischemia) Infection (unlikely, afebrile, no white count, no URI symptoms) Adrenal mass (found incidentally, no follow up recommended)    Assessment & Plan Acute exacerbation of CHF (congestive heart failure) (HCC) Atrial fibrillation with RVR (HCC) Dyspnea x 2 weeks w/ acute worsening over the last 48 hours. S/p successful cardioversion for Afib w/ RvR in the ED. BNP 1876. Trop borderline and trending. Appears to be at dry weight (~121kg)  -Admit to FMTS, progressive floor, attending Dr Delores -Continuous cardiac monitoring -Pulse ox w/ vitals -Lasix  40mg  IV once -Cardiology consulted, appreciate their recs -Echo pending -PM EKG @ 1700 -Continue home carvedilol  12.5 mg daily -Continue home eliquis  5mg  BID -Strict I/Os -Daily weights -PT/OT eval and treat -TSH pending  -AM BMP Chronic health problem T2DM:  -Last A1c 6.6.  -Continue home  farxiga  -TID CBG monitoring HTN: Continue home losartan  Dyslipidemia: continue home lipitor B12 deficiency: continue home b12  FEN/GI: NPO VTE Prophylaxis: Home eliquis   Disposition: Progressive  History of Present Illness:  Shawn Perkins is a 68 y.o. male presenting with SOB. Has been having increased SOB for the past 2 weeks, worsening since yesterday. Was unable to sleep due to SOB, symptoms worsening when lying flat. Is taking medication as prescribed. Does not have any sick symptoms or sick contacts. Denies chest pain. Took medication this AM. No trouble with acquiring or taking his meds, compliant with all including eliquis . Denies new LE swelling but reports he does feel his belly is more distended than usual. Historically he has come to the ED for cardioversion and they've discharged him without admission.   In the ED, he arrived tachycardic, satting 95% on room air with elevated diastolics BP 120s/100s. He was afebrile. EKG showed atrial fibrillation with RvR. CXR showed increased interstitial markings with stable cardiomegaly. Lab workup was notable for BMP and CBC WNL, pro BNP elevated to 1876, first trop 21 with second pending, and flu/covid negative. Patient was given a dose of solumedrol with EMS and levalbuterol  nebulizer in the ED. He was given propofol  and cardioverted into sinus rhythm with occasional PACs.   Review Of Systems: Per HPI   Pertinent Past Medical History: COPD A fib s/p ablation HTN CHF  Remainder reviewed in history tab.   Pertinent Past Surgical History: Bowel resection 12/05/12  Tonsillectomy  Umbilical hernia repair Atrial ablation 4/25   Remainder reviewed in history tab.   Pertinent Social History: Tobacco use: Currently, smoking occasionally, last was 2 weeks ago  Alcohol use:  No Other Substance use: no Lives alone   Pertinent Family History: Mother, deceased, kidney failure Remainder reviewed in history tab.   Important Outpatient  Medications: Eliquis  Lipitor Coreg  Farxiga  Lasix  prn - not taken since last week Klor-con  Losartan  Semaglutide   B12   Remainder reviewed in medication history.   Objective: BP (!) 125/104   Pulse (!) 129   Temp (!) 97.4 F (36.3 C) (Oral)   Resp (!) 26   Wt 120 kg   SpO2 98%   BMI 36.90 kg/m  Exam:  General: Awake, alert, NAD. Communicates clearly. Cardio: Irregular rhythm. Normal S1, S2. No murmur, rub, gallop. 2+ radial and dorsalis pedis pulses b/l w/ good capillary refill. No LE edema appreciated. Resp: Wheezes appreciated anteriorly with mild  b/l prominent transmitted upper airway sounds. Normal wob on 2L [place].  Abdomen: distended without fluid wave, not TTP   Neuro: AOx3. No focal deficits.   Labs:  CBC BMET  Recent Labs  Lab 02/05/24 0948  WBC 9.5  HGB 15.0  HCT 47.1  PLT 140*   Recent Labs  Lab 02/05/24 0948  NA 144  K 4.3  CL 112*  CO2 19*  BUN 16  CREATININE 1.12  GLUCOSE 84  CALCIUM  9.2     Pro BNP 1876 Flu covid neg Trop 21, repeat pending  EKG: Sinus rhythm w/ PACs.   Imaging Studies Performed:  CXR:   My Interpretation: Increased interstitial markings increased vs prior stable chronic changes. Cardiomegaly stable vs prior.   Manon Jester DO 02/05/2024, 12:23 PM PGY-1, [facility]  FPTS Intern pager: [phone], text pages welcome Secure chat group [facility] Teaching Service   Upper Level Attestation I have seen and examined the patient with the resident. I agree with the history, physical, and assessment above Gloriann Ogren, MD 2:47 PM PGY-2,  Family Medicine   Family Medicine Teaching Service

## 2024-02-05 NOTE — Plan of Care (Signed)
 FMTS Interim Progress Note  S:Visited patient at bedside for PM round with Dr. Nygaard. Patient says he is feeling well enough to go home. He endorses having large urinary output.  Denies palpitations or lightheadedness.   Patient endorses some back pain from laying in one position on the emergency room bed for a long time.   O: BP (!) 137/98   Pulse 98   Temp 97.9 F (36.6 C) (Oral)   Resp 19   Wt 120 kg   SpO2 94%   BMI 36.90 kg/m   General: well appearing, in no acute distress CV: RRR, radial pulses equal and palpable, trace BLE edema  Resp: Normal work of breathing on room air, CTAB Abd: Soft, non tender, large ventral hernia on right side, non tender to palpation  Neuro: Alert & Oriented      A/P: Afib with RVR I CHF exacerbation  RVR is resolved now. Patient in sinus rhythm. Having adequate output with IV lasix .  - Continue monitor HR overnight and evaluate volume status in the morning  - AM BMP  - Continue coreg  and eliquis   - Follow up cardiology recommendations.   Nicholas Bar, MD 02/05/2024, 11:19 PM PGY-3, [facility] Family Medicine Service pager [phone]

## 2024-02-05 NOTE — Consult Note (Signed)
 Cardiology Consultation   Patient ID: Shawn Perkins MRN: [id number]; DOB: December 25, 1955  Admit date: 02/05/2024 Date of Consult: 02/05/2024  PCP:  Delores Suzann HERO, MD   [facility] Providers Cardiologist:  None  Electrophysiologist:  Fonda Kitty, MD     Patient Profile: Shawn Perkins is a 68 y.o. male with a hx of hypertension, tobacco and alcohol use, chronic systolic heart failure-NICM, atrial fibrillation s/p ablation 05/2023, left adrenal mass, who is being seen 02/05/2024 for the evaluation of heart failure and atrial fibrillation at the request of Suzann Delores MD.  History of Present Illness: Mr. Althaus follows with the advanced heart failure team.  Had a cath in 2012 which showed normal coronaries EF at that time was 35%.  In 2014 he did have a heart failure exacerbation in the setting of medication noncompliance with severe hypertension.  He was restarted on his medications and discharged home, though he was lost to cardiology follow-up for several years.  He did follow-up with his primary care in 2024 for shortness of breath and was noted to be in new atrial fibrillation and decompensated heart failure.  In that year patient did undergo for DCCV started on amiodarone .  He was then referred to EP who pursued an ablation 05/2023.  He was last seen by EP 08/2023.  At that time patient was doing and in sinus rhythm.  He did have some trace ankle edema and was encouraged to start using compression stockings.  Education on as needed Lasix  dosing.  Of note patient failed Entresto  due to hypotension and orthostasis.  Failed spironolactone  due to hyperkalemia.  Presented to the ED via EMS for exertional and positional shortness of breath with productive cough.    In the ED: BP:124/103 ECG: AFL VR 124 CXR showed cardiomegaly with mild diffuse interstitial opacities.  Pertinent lab work C 1.12 [baseline] proBNP 1876 Troponin 21  While in the ED patient was cardioverted to normal  sinus rhythm.   ECG: sinus rhythm with PVC and PAC. VR 108 He also received IV Lasix  40mg .  And 1 breathing treatment.  On interview, patient shared in the last two weeks he started noticing DOE [SOB going up one flight of stairs] and significant orthopnea. He has not noticed significant weight gain, though reports he has been losing a lot weight since starting a GLP. Has also cut out fried foods and sweets. Last took as needed lasix  4 days ago.  Denied recent illness. Denied peripheral edema.  Has noticed occasional palpitations since ablation, though nothing sustained.   Past Medical History:  Diagnosis Date   CHF (congestive heart failure) (HCC)    Diverticulosis    Heart murmur    History of cocaine abuse (HCC)    use to smoke crack cocaine, last use was more than 6 years ago   Hypertension    Rheumatic fever    as a child   Systolic heart failure    a. 11/09/10 echo EF 35%, b. 11/11/10 cath norm cors c. ECHO (11/2012) EF 35-50%   Umbilical hernia, incarcerated 11/2012   11/2012 exploratory lap with small bwl resection    Past Surgical History:  Procedure Laterality Date   ATRIAL FIBRILLATION ABLATION N/A 06/13/2023   Procedure: ATRIAL FIBRILLATION ABLATION;  Surgeon: Kitty Fonda, MD;  Location: [facility] INVASIVE CV LAB;  Service: Cardiovascular;  Laterality: N/A;   BOWEL RESECTION  12/05/2012   Procedure: SMALL BOWEL RESECTION;  Surgeon: Lynda Leos, MD;  Location: [facility] OR;  Service:  General;;   CARDIOVERSION N/A 11/09/2022   Procedure: CARDIOVERSION;  Surgeon: Rolan Ezra RAMAN, MD;  Location: [facility] INVASIVE CV LAB;  Service: Cardiovascular;  Laterality: N/A;   CARDIOVERSION N/A 12/01/2022   Procedure: CARDIOVERSION;  Surgeon: Rolan Ezra RAMAN, MD;  Location: [facility] INVASIVE CV LAB;  Service: Cardiovascular;  Laterality: N/A;   SMALL INTESTINE SURGERY  12/05/2012   TONSILLECTOMY     UMBILICAL HERNIA REPAIR     2014       Scheduled Meds:  apixaban   5 mg Oral BID   [START ON  02/06/2024] atorvastatin   40 mg Oral Daily   carvedilol   12.5 mg Oral BID   [START ON 02/06/2024] cyanocobalamin   1,000 mcg Oral Daily   [START ON 02/06/2024] dapagliflozin  propanediol  10 mg Oral QAC breakfast   [START ON 02/06/2024] losartan   25 mg Oral Daily   sodium chloride  flush  3 mL Intravenous Q12H   Continuous Infusions:  PRN Meds: acetaminophen   Allergies:   Allergies[1]  Social History:   Social History   Socioeconomic History   Marital status: Single    Spouse name: Not on file   Number of children: Not on file   Years of education: Not on file   Highest education level: Not on file  Occupational History   Not on file  Tobacco Use   Smoking status: Former    Current packs/day: 0.00    Average packs/day: 0.1 packs/day for 43.6 years (5.5 ttl pk-yrs)    Types: Cigarettes    Start date: 02/21/1980    Quit date: 09/21/2023    Years since quitting: 0.3   Smokeless tobacco: Never  Substance and Sexual Activity   Alcohol use: Yes    Alcohol/week: 4.0 standard drinks of alcohol    Types: 4 Cans of beer per week    Comment: 12/05/2012 no more than 12 pack beer/week; haven't had anything in 3 wks   Drug use: Yes    Types: Marijuana    Comment: 12/05/2012 used to smoke weed & crack cocaine; none in over 6 years ago   Sexual activity: Not Currently  Other Topics Concern   Not on file  Social History Narrative   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), Saturdays at apartment complex    Social Drivers of Health   Tobacco Use: Medium Risk (02/05/2024)   Patient History    Smoking Tobacco Use: Former    Smokeless Tobacco Use: Never    Passive Exposure: Not on Actuary Strain: Low Risk (03/26/2023)   Overall Financial Resource Strain (CARDIA)    Difficulty of Paying Living Expenses: Not hard at all  Food Insecurity: No Food Insecurity (03/26/2023)   Hunger Vital Sign    Worried About Running Out of Food in the Last Year: Never true    Ran Out of  Food in the Last Year: Never true  Transportation Needs: No Transportation Needs (03/26/2023)   PRAPARE - Administrator, Civil Service (Medical): No    Lack of Transportation (Non-Medical): No  Physical Activity: Sufficiently Active (03/26/2023)   Exercise Vital Sign    Days of Exercise per Week: 7 days    Minutes of Exercise per Session: 60 min  Stress: No Stress Concern Present (03/26/2023)   [employer] of Occupational Health - Occupational Stress Questionnaire    Feeling of Stress : Not at all  Social Connections: Moderately Integrated (03/26/2023)   Social Connection and Isolation Panel    Frequency of  Communication with Friends and Family: More than three times a week    Frequency of Social Gatherings with Friends and Family: More than three times a week    Attends Religious Services: More than 4 times per year    Active Member of [employer] or Organizations: Yes    Attends Banker Meetings: More than 4 times per year    Marital Status: Never married  Intimate Partner Violence: Not At Risk (03/26/2023)   Humiliation, Afraid, Rape, and Kick questionnaire    Fear of Current or Ex-Partner: No    Emotionally Abused: No    Physically Abused: No    Sexually Abused: No  Depression (PHQ2-9): Low Risk (12/24/2023)   Depression (PHQ2-9)    PHQ-2 Score: 0  Alcohol Screen: Low Risk (03/26/2023)   Alcohol Screen    Last Alcohol Screening Score (AUDIT): 3  Housing: Low Risk (03/26/2023)   Housing Stability Vital Sign    Unable to Pay for Housing in the Last Year: No    Number of Times Moved in the Last Year: 0    Homeless in the Last Year: No  Utilities: Not At Risk (03/26/2023)   AHC Utilities    Threatened with loss of utilities: No  Health Literacy: Adequate Health Literacy (03/26/2023)   B1300 Health Literacy    Frequency of need for help with medical instructions: Never    Family History:   Family History  Problem Relation Age of Onset   Kidney failure Mother     Healthy Father    Heart disease Neg Hx    Diabetes Neg Hx    Hyperlipidemia Neg Hx    COPD Neg Hx    Colon cancer Neg Hx    Rectal cancer Neg Hx    Stomach cancer Neg Hx      ROS:  Please see the history of present illness.  All other ROS reviewed and negative.     Physical Exam/Data: Vitals:   02/05/24 1244 02/05/24 1247 02/05/24 1251 02/05/24 1255  BP: (!) 140/112 (!) 142/120 (!) 127/103 (!) 133/103  Pulse: (!) 132 97 (!) 103 (!) 102  Resp: (!) 27 20 (!) 29 (!) 30  Temp: 97.6 F (36.4 C) (!) 97.4 F (36.3 C) 97.6 F (36.4 C) 97.6 F (36.4 C)  TempSrc:    Oral  SpO2: 95% 99% 93% 93%  Weight:       No intake or output data in the 24 hours ending 02/05/24 1508    02/05/2024   12:15 PM 01/14/2024    8:07 AM 12/24/2023   10:48 AM  Last 3 Weights  Weight (lbs) 264 lb 8.8 oz 270 lb 268 lb 9.6 oz  Weight (kg) 120 kg 122.471 kg 121.836 kg     Body mass index is 36.9 kg/m.  General:  Well nourished, well developed, in no acute distress HEENT: normal Neck: JVD Vascular: Distal pulses 2+ bilaterally Cardiac:  regular rhythm, tachycardic rate; 1/6 blowing murmur in mitral area Lungs:  Crackles at bases and occasional wheeze Abd: soft, nontender, ventral hernia Ext: no edema Skin: warm and dry  Psych:  Normal affect   EKG:  The EKG was personally reviewed and demonstrates: See HPI Telemetry:  Telemetry was personally reviewed and demonstrates:  sinus tachycardia HR ~105  Relevant CV Studies: Echocardiogram 01/25/23 IMPRESSIONS     1. Left ventricular ejection fraction, by estimation, is 50 to 55%. The  left ventricle has low normal function. Left ventricular endocardial  border not  optimally defined to evaluate regional wall motion. The left  ventricular internal cavity size was  mildly dilated. There is mild concentric left ventricular hypertrophy.  Left ventricular diastolic parameters are indeterminate.   2. Right ventricular systolic function is normal. The  right ventricular  size is normal.   3. Left atrial size was mildly dilated.   4. The mitral valve is degenerative. Mild mitral valve regurgitation.  Mild mitral stenosis. The mean mitral valve gradient is 4.0 mmHg with  average heart rate of 53 bpm.   5. The aortic valve is tricuspid. There is moderate calcification of the  aortic valve. There is mild thickening of the aortic valve. Aortic valve  regurgitation is mild. Aortic valve sclerosis is present, with no evidence  of aortic valve stenosis.   6. The inferior vena cava is normal in size with greater than 50%  respiratory variability, suggesting right atrial pressure of 3 mmHg.    Laboratory Data: High Sensitivity Troponin:   Recent Labs  Lab 02/05/24 0948  TRNPT 21*      Chemistry Recent Labs  Lab 02/05/24 0948  NA 144  K 4.3  CL 112*  CO2 19*  GLUCOSE 84  BUN 16  CREATININE 1.12  CALCIUM  9.2  GFRNONAA >60  ANIONGAP 13    Hematology Recent Labs  Lab 02/05/24 0948  WBC 9.5  RBC 4.49  HGB 15.0  HCT 47.1  MCV 104.9*  MCH 33.4  MCHC 31.8  RDW 15.5  PLT 140*   BNP Recent Labs  Lab 02/05/24 0948  PROBNP 1,876.0*     Radiology/Studies:  DG Chest Port 1 View Result Date: 02/05/2024 EXAM: 1 VIEW(S) XRAY OF THE CHEST 02/05/2024 09:20:00 AM COMPARISON: 09/27/2022 CLINICAL HISTORY: sob FINDINGS: LUNGS AND PLEURA: Mild diffuse interstitial opacities. No pleural effusion. No pneumothorax. HEART AND MEDIASTINUM: Cardiomegaly. BONES AND SOFT TISSUES: No acute osseous abnormality. IMPRESSION: 1. Mild diffuse interstitial opacities. 2. Cardiomegaly. Electronically signed by: Evalene Coho MD 02/05/2024 09:25 AM EST RP Workstation: [id number]     Assessment and Plan:  Atrial flutter with episode of RVR Recent AF ablation 05/2023. Patient had remained in NSR since.  Arrived to ED in AFL RVR. Cardioverted in the ED to NSR.  Currently in sinus tachycardia Chad Vasc score 4 Keep K > 4 and Mag > 2. Will order  mag. TSH pending Echo pending  Educated on the importance of eliquis  adherence.  Continue coreg  12.5 twice daily Continue eliquis  5 mg twice daily  Acute on chronic HFimEF [35-40% in 2014: 50-55% in 2024] NICM Normal LHC in 2012. Suspected 2/2 HTN. +EOTH hx. Represented to AHF in 2024. Decomp suspected to be tachy mediated, he was referred to EP who pursued ablation as above.   This admission reports significant orthopnea and DOE the past 2 weeks ProBNP 1876 Renal function stable Will order hepatic function panel.  Echo pending  Received IV lasix  40 mg. No UOP charted. Reported about 2L of UOP. Reports SOB improving. Strict I/Os.  On exam, appears volume up  Patient just received IV lasix  dose this afternoon, will hold on ordering another for today. Reassess in the morning. Continue coreg  12.5 mg twice daily Continue farxiga  10 mg Continue Cozaar  25 mg Entresto  deferred due to history of hypotension and orthostasis Spironolactone  deferred due to history of hyperkalemia  Elevated Troponin Most likely demand ischemia 2/2 AFL RVR and volume overload.  Denied chest pain  ECG without ischemic changes  Hypertension BP: 133/103 Medications as above Following with  endocrinology for his adrenal nodule suspected to be benign. His metanephrine level was elevated though it it not suspect to be due to a pheochromocytoma. Recommend keeping endocrinology outpatient follow up.   Hyperlipidemia 11/2023 LDL 69   HDL 38 Continue lipitor 40 mg  Per primary T2DM  Adrenal nodule   Risk Assessment/Risk Scores:       New York  Heart Association (NYHA) Functional Class NYHA Class III/IV  CHA2DS2-VASc Score = 4   This indicates a 4.8% annual risk of stroke. The patient's score is based upon: CHF History: 1 HTN History: 1 Diabetes History: 1 Stroke History: 0 Vascular Disease History: 0 Age Score: 1 Gender Score: 0        For questions or updates, please contact [facility] Please consult [email or web address] for contact info under      Signed, Leontine LOISE Salen, PA-C  02/05/2024 3:08 PM     [1]  Allergies Allergen Reactions   Penicillins Other (See Comments)    Childhood reaction

## 2024-02-05 NOTE — Assessment & Plan Note (Addendum)
 T2DM:  -Last A1c 6.6.  -Continue home farxiga  -TID CBG monitoring HTN: Continue home losartan  Dyslipidemia: continue home lipitor B12 deficiency: continue home b12

## 2024-02-05 NOTE — Hospital Course (Addendum)
 Shawn Perkins is a 68 year old M with history of Afib s/p previous ablation, HFrE due to NICMF, that is admitted for A-fib RVR and CHF exacerbation. He was admitted to [facility] Teaching Service.  Afib RVR  CHF Exacerbation SOB Presented to ED with 1 day history of shortness of breath, found to be in A-fib RVR but maintaining MAPs. Underwent cardioversion w/ EDP. Cardiology consulted, patient converted back to sinus rhythm. They recommended patient be continued on his home GDMT of farxiga , losartan . He is not on spironolactone  or entresto  since he has not tolerated it in the past due to hyperkalemia. Pt also diuresed w/ IV lasix . Echo showed decreased EF to 25-30% from previous echo on 01/2023 w EF 50-55%. Patient was continued on his home eliquis  and carvedilol . Cardiology also recommended sleep study as he is high risk for OSA and could be increasing his risk of Afib and worsening HF; however, patient declined. Cardiology switched losartan  to Entresto , added spironolactone , and increased coreg  to optimize GDMT; farxiga  continued. Entresto  was started at half dose given history of low Bps and hyperkalemia.   Wheezing heard on exam and with patients history of smoking and emphysema seen on imaging, started patient on controller inhaler. Patient is inhaler niave. He tolerated the inhaler well.     Other chronic conditions were medically managed with home medications and formulary alternatives as necessary (T2DM, hypertension, HLD)  PCP Follow-up Recommendations: Ensure cardiology follow up Follow-up controller inhaler for likely COPD - consider PFTs He will need BMP in 2-3 weeks at follow-up for history of hyperkalemia Entresto  started at half dose, consider increasing if patient is tolerating Lasix  daily was started, consider titrating dose based on symptoms

## 2024-02-06 ENCOUNTER — Inpatient Hospital Stay (HOSPITAL_COMMUNITY): Payer: Medicare (Managed Care)

## 2024-02-06 ENCOUNTER — Telehealth (HOSPITAL_COMMUNITY): Payer: Self-pay

## 2024-02-06 ENCOUNTER — Other Ambulatory Visit (HOSPITAL_COMMUNITY): Payer: Self-pay

## 2024-02-06 DIAGNOSIS — I5041 Acute combined systolic (congestive) and diastolic (congestive) heart failure: Secondary | ICD-10-CM | POA: Diagnosis not present

## 2024-02-06 DIAGNOSIS — I509 Heart failure, unspecified: Secondary | ICD-10-CM

## 2024-02-06 LAB — BASIC METABOLIC PANEL WITH GFR
Anion gap: 15 (ref 5–15)
BUN: 27 mg/dL — ABNORMAL HIGH (ref 8–23)
CO2: 20 mmol/L — ABNORMAL LOW (ref 22–32)
Calcium: 9.3 mg/dL (ref 8.9–10.3)
Chloride: 106 mmol/L (ref 98–111)
Creatinine, Ser: 1.16 mg/dL (ref 0.61–1.24)
GFR, Estimated: >60 mL/min (ref >60)
Glucose, Bld: 110 mg/dL — ABNORMAL HIGH (ref 70–99)
Potassium: 4.3 mmol/L (ref 3.5–5.1)
Sodium: 140 mmol/L (ref 135–145)

## 2024-02-06 LAB — CBG MONITORING, ED: Glucose-Capillary: 128 mg/dL — ABNORMAL HIGH (ref 70–99)

## 2024-02-06 LAB — MAGNESIUM: Magnesium: 2.5 mg/dL — ABNORMAL HIGH (ref 1.7–2.4)

## 2024-02-06 MED ORDER — IPRATROPIUM-ALBUTEROL 0.5-2.5 (3) MG/3ML IN SOLN
3.0000 mL | Freq: Four times a day (QID) | RESPIRATORY_TRACT | Status: DC | PRN
  Administered 2024-02-06: 06:00:00 3 mL via RESPIRATORY_TRACT
  Filled 2024-02-06: qty 3

## 2024-02-06 MED ORDER — CARVEDILOL 25 MG PO TABS
25.0000 mg | ORAL_TABLET | Freq: Two times a day (BID) | ORAL | Status: DC
  Administered 2024-02-06 – 2024-02-07 (×2): 25 mg via ORAL
  Filled 2024-02-06 (×2): qty 1

## 2024-02-06 MED ORDER — LIDOCAINE 5 % EX PTCH
1.0000 | MEDICATED_PATCH | CUTANEOUS | Status: DC
  Administered 2024-02-06: 07:00:00 1 via TRANSDERMAL
  Filled 2024-02-06: qty 1

## 2024-02-06 MED ORDER — PERFLUTREN LIPID MICROSPHERE
1.0000 mL | INTRAVENOUS | Status: AC | PRN
  Administered 2024-02-06: 16:00:00 3 mL via INTRAVENOUS

## 2024-02-06 MED ORDER — FUROSEMIDE 10 MG/ML IJ SOLN
40.0000 mg | Freq: Once | INTRAMUSCULAR | Status: AC
  Administered 2024-02-06: 10:00:00 40 mg via INTRAVENOUS
  Filled 2024-02-06: qty 4

## 2024-02-06 MED ORDER — SPIRONOLACTONE 25 MG PO TABS
25.0000 mg | ORAL_TABLET | Freq: Every day | ORAL | Status: DC
  Administered 2024-02-06 – 2024-02-07 (×2): 25 mg via ORAL
  Filled 2024-02-06 (×2): qty 1

## 2024-02-06 MED ORDER — SACUBITRIL-VALSARTAN 49-51 MG PO TABS
1.0000 | ORAL_TABLET | Freq: Two times a day (BID) | ORAL | Status: DC
  Administered 2024-02-07: 11:00:00 1 via ORAL
  Filled 2024-02-06: qty 1

## 2024-02-06 NOTE — Progress Notes (Signed)
 OT Cancellation Note  Patient Details Name: Shawn Perkins MRN: [id number] DOB: [birth year]   Cancelled Treatment:    Reason Eval/Treat Not Completed: OT screened, no needs identified, will sign off per PT, pt independent and without need for formal OT eval at this time.   [name] 02/06/2024, 11:03 AM

## 2024-02-06 NOTE — Progress Notes (Signed)
 Heart Failure Navigator Progress Note  Assessed for Heart & Vascular TOC clinic readiness.  Patient does not meet criteria due to he is a Advanced Heart Failure Team patient of Dr. Rolan. .   Navigator will sign off at this time.   Stephane Haddock, BSN, Scientist, clinical (histocompatibility and immunogenetics) Only

## 2024-02-06 NOTE — Assessment & Plan Note (Signed)
 T2DM:  -Last A1c 6.6.  -Continue home farxiga  -TID CBG monitoring HTN: Continue home losartan  Dyslipidemia: continue home lipitor B12 deficiency: continue home b12

## 2024-02-06 NOTE — Plan of Care (Signed)
   Problem: Education: Goal: Knowledge of General Education information will improve Description Including pain rating scale, medication(s)/side effects and non-pharmacologic comfort measures Outcome: Progressing

## 2024-02-06 NOTE — Assessment & Plan Note (Addendum)
 2L UOP last 24 hours. - Continue cardiac monitoring  -Lasix  40mg  IV once more today -Cardiology consulted, patient dispo pending their recs -Echo pending -Continue home carvedilol  12.5 mg daily -Continue home eliquis  5mg  BID -Strict I/Os -Daily weights -TSH WNL

## 2024-02-06 NOTE — Progress Notes (Addendum)
° ° ° °  Daily Progress Note Intern Pager: [phone]  Patient name: Shawn Perkins Medical record number: [id number] Date of birth: 12/23/1955 Age: 68 y.o. Gender: male  Primary Care Provider: Delores Suzann HERO, MD Consultants: Cardiology Code Status: Full  Pt Overview and Major Events to Date:  12/16-admitted, cardioverted  Medical Decision Making:  68 yo m w/ hx of Afib w/ multiple prior cardioversions and ablation, CHF, smoking admitted for shortness of breathing s/p cardioversion for afib w/ RVR in the ED. He had 2 L of urinary output after 40 mg of IV Lasix  12/16.  Giving him another dose of IV Lasix  today pending echocardiogram and cardiology recommendations.  Likely discharge later today with new controller inhaler for likely COPD. Assessment & Plan Acute congestive heart failure (HCC) Atrial fibrillation with RVR (HCC) 2L UOP last 24 hours. - Continue cardiac monitoring  -Lasix  40mg  IV once more today -Cardiology consulted, patient dispo pending their recs -Echo pending -Continue home carvedilol  12.5 mg daily -Continue home eliquis  5mg  BID -Strict I/Os -Daily weights -TSH WNL Chronic health problem T2DM:  -Last A1c 6.6.  -Continue home farxiga  -TID CBG monitoring HTN: Continue home losartan  Dyslipidemia: continue home lipitor B12 deficiency: continue home b12   FEN/GI: Heart healthy PPx: home eliquis  Dispo:Home today. Barriers include echo, cards recs.   Subjective:  NAEON. Patient eager to leave. Endorses hx of smoking, no hx of inhaler treatments at home. Discussed options for treatment of possible COPD given multifactorial hx of dyspnea.   Objective: Temp:  [97.8 F (36.6 C)-98.8 F (37.1 C)] 97.9 F (36.6 C) (12/17 1216) Pulse Rate:  [81-104] 86 (12/17 1216) Resp:  [15-34] 18 (12/17 1216) BP: (102-154)/(84-118) 121/89 (12/17 1216) SpO2:  [89 %-99 %] 95 % (12/17 1216) Weight:  [120 kg] 120 kg (12/17 1238) Physical Exam: General: Awake, alert, NAD.  Communicates clearly. Cardio: RRR. Normal S1, S2. No murmur, rub, gallop. 2+ radial and dorsalis pedis pulses b/l w/ good capillary refill. No LE edema appreciated.  Resp: Soft expiratory wheezes appreciated bilaterally. Normal wob on room air. Abdomen: soft, non-tender, non-distended.    Laboratory: Most recent CBC Lab Results  Component Value Date   WBC 9.5 02/05/2024   HGB 15.0 02/05/2024   HCT 47.1 02/05/2024   MCV 104.9 (H) 02/05/2024   PLT 140 (L) 02/05/2024   Most recent BMP    Latest Ref Rng & Units 02/06/2024    2:34 AM  BMP  Glucose 70 - 99 mg/dL 889   BUN 8 - 23 mg/dL 27   Creatinine 9.38 - 1.24 mg/dL 8.83   Sodium 864 - 854 mmol/L 140   Potassium 3.5 - 5.1 mmol/L 4.3   Chloride 98 - 111 mmol/L 106   CO2 22 - 32 mmol/L 20   Calcium  8.9 - 10.3 mg/dL 9.3    TSH 9.52 Mg 2.5  Imaging/Diagnostic Tests: Echo pending  CXR with diffuse interstitial opacities and cardiomegaly 12/16  Manon Jester, DO 02/06/2024, 2:27 PM  PGY-1, [facility] FPTS Intern pager: [phone], text pages welcome Secure chat group [facility] Teaching Service

## 2024-02-06 NOTE — Discharge Instructions (Signed)
Thank you for letting us care for you during your stay.  You were admitted to the [facility] Medicine Teaching Service.   You were admitted for --.  We found the following during your stay --.   We recommend follow up specifically for --.   Other important diagnoses identified during your stay were --.   Please follow up with your primary care physician in 1 week.   If your symptoms worsen or return, please return to the hospital.  Please let us know if you have questions about your stay at [facility].

## 2024-02-06 NOTE — Progress Notes (Shared)
° ° ° °  Daily Progress Note Intern Pager: [phone]  Patient name: Shawn Perkins Medical record number: [id number] Date of birth: 25-Jun-1955 Age: 68 y.o. Gender: male  Primary Care Provider: Delores Suzann HERO, MD Consultants: *** Code Status: ***  Pt Overview and Major Events to Date:  ***  Medical Decision Making:  ***. Pertinent PMH/PSH includes ***.  Assessment & Plan Acute congestive heart failure (HCC) Atrial fibrillation with RVR (HCC) Some tachypnea early this AM, improved w/ prn duonebs No f/u weight -2L net Cr stable Mg improved TSH WNL Trop downtrending CXR suggests chf: CXR w/ diffuse interstitial opacities and cardiomegaly NSR on f/u ekg    Dyspnea x 2 weeks w/ acute worsening over the last 48 hours. S/p successful cardioversion for Afib w/ RvR in the ED. BNP 1876. Trop borderline and trending. Appears to be at dry weight (~121kg)  -Admit to FMTS, progressive floor, attending Dr Delores -Continuous cardiac monitoring -Pulse ox w/ vitals -Lasix  40mg  IV once -Cardiology consulted, appreciate their recs -Echo pending -PM EKG @ 1700 -Continue home carvedilol  12.5 mg daily -Continue home eliquis  5mg  BID -Strict I/Os -Daily weights -PT/OT eval and treat -TSH pending  -AM BMP Chronic health problem     T2DM:  -Last A1c 6.6.  -Continue home farxiga  -TID CBG monitoring HTN: Continue home losartan  Dyslipidemia: continue home lipitor B12 deficiency: continue home b12 Primary hypertension  Acute on chronic diastolic heart failure (HCC)  Atrial fibrillation with rapid ventricular response (HCC)  Atypical atrial flutter (HCC)    *** bullet points preferred for A/P, please avoid repeating one liner/medical decision making, delete statement to sign ***    Chronic and Stable Issues: ***  FEN/GI: *** PPx: *** Dispo:{FPTSDISOLIST:27587} {FPTSDISOTIME:27588}. Barriers include ***.   Subjective:  ***  Objective: Temp:  [97.4 F (36.3 C)-98.8 F (37.1  C)] 97.9 F (36.6 C) (12/17 0724) Pulse Rate:  [81-132] 86 (12/17 0724) Resp:  [15-34] 20 (12/17 0724) BP: (102-154)/(84-120) 114/95 (12/17 0724) SpO2:  [89 %-99 %] 93 % (12/17 0724) Weight:  [120 kg] 120 kg (12/16 1215) Physical Exam: General: *** Cardiovascular: *** Respiratory: *** Abdomen: *** Extremities: ***  Laboratory: Most recent CBC Lab Results  Component Value Date   WBC 9.5 02/05/2024   HGB 15.0 02/05/2024   HCT 47.1 02/05/2024   MCV 104.9 (H) 02/05/2024   PLT 140 (L) 02/05/2024   Most recent BMP    Latest Ref Rng & Units 02/06/2024    2:34 AM  BMP  Glucose 70 - 99 mg/dL 889   BUN 8 - 23 mg/dL 27   Creatinine 9.38 - 1.24 mg/dL 8.83   Sodium 864 - 854 mmol/L 140   Potassium 3.5 - 5.1 mmol/L 4.3   Chloride 98 - 111 mmol/L 106   CO2 22 - 32 mmol/L 20   Calcium  8.9 - 10.3 mg/dL 9.3     Other pertinent labs ***   Imaging/Diagnostic Tests: Radiologist Impression: *** My interpretation: PIERRETTE Manon Jester, DO 02/06/2024, 7:32 AM  PGY-1, [place] [facility] FPTS Intern pager: [phone], text pages welcome Secure chat group [facility] Teaching Service

## 2024-02-06 NOTE — Progress Notes (Signed)
 Patient Name: Shawn Perkins Date of Encounter: 02/06/2024 [facility] Cardiologist: Ezra Shuck, MD  02/05/2024 .admit Length of stay: 1  Interval Summary  .    Shawn Perkins is a 68 y.o. male with h/o AF, HTN, HFrEF due to NICM, left adrenal mass, tobacco abuse, prior ETOH abuse quit in 2022, also quit smoking tobacco, heart failure with recovered LVEF for nonischemic cardiomyopathy from A-fib SP atrial fibrillation ablation in April 2025 now admitted to the hospital with worsening dyspnea on exertion for the past 1 month.   He has been compliant with anticoagulation, presented to the emergency room on 02/05/2024 and underwent cardioversion for atypical atrial flutter back to sinus rhythm.  He is presently doing well, states that his dyspnea has improved.  No recurrence of atrial fibrillation/flutter on telemetry.  Physical Exam    Vitals:   02/06/24 0724 02/06/24 0935 02/06/24 1216 02/06/24 1238  BP: (!) 114/95 118/88 121/89   Pulse: 86 96 86   Resp: 20  18   Temp: 97.9 F (36.6 C)  97.9 F (36.6 C)   TempSrc: Oral  Oral   SpO2: 93%  95%   Weight:    120 kg  Height:    5' 11 (1.803 m)   Physical Exam Constitutional:      Appearance: He is obese.  Neck:     Vascular: No carotid bruit or JVD.  Cardiovascular:     Rate and Rhythm: Normal rate and regular rhythm.     Pulses: Intact distal pulses.     Heart sounds: Normal heart sounds. No murmur heard.    No gallop.  Pulmonary:     Effort: Pulmonary effort is normal.     Breath sounds: Normal breath sounds.  Abdominal:     General: Bowel sounds are normal.     Palpations: Abdomen is soft.  Musculoskeletal:     Right lower leg: No edema.     Left lower leg: No edema.        02/06/2024   12:38 PM 02/05/2024   12:15 PM 01/14/2024    8:07 AM  Last 3 Weights  Weight (lbs) 264 lb 8.8 oz 264 lb 8.8 oz 270 lb  Weight (kg) 120 kg 120 kg 122.471 kg      Labs   Lab Results  Component Value Date   NA  140 02/06/2024   K 4.3 02/06/2024   CO2 20 (L) 02/06/2024   GLUCOSE 110 (H) 02/06/2024   BUN 27 (H) 02/06/2024   CREATININE 1.16 02/06/2024   CALCIUM  9.3 02/06/2024   GFR 94.35 05/30/2013   EGFR 58 (L) 05/21/2023   GFRNONAA >60 02/06/2024       Latest Ref Rng & Units 02/06/2024    2:34 AM 02/05/2024    9:48 AM 07/18/2023    2:31 PM  BMP  Glucose 70 - 99 mg/dL 889  84  895   BUN 8 - 23 mg/dL 27  16  16    Creatinine 0.61 - 1.24 mg/dL 8.83  8.87  8.62   Sodium 135 - 145 mmol/L 140  144  140   Potassium 3.5 - 5.1 mmol/L 4.3  4.3  4.2   Chloride 98 - 111 mmol/L 106  112  110   CO2 22 - 32 mmol/L 20  19  25    Calcium  8.9 - 10.3 mg/dL 9.3  9.2  9.2        Latest Ref Rng & Units 02/05/2024  9:48 AM 07/18/2023    2:31 PM 05/21/2023    2:44 PM  CBC  WBC 4.0 - 10.5 K/uL 9.5  7.4  8.1   Hemoglobin 13.0 - 17.0 g/dL 84.9  85.5  85.1   Hematocrit 39.0 - 52.0 % 47.1  42.7  44.2   Platelets 150 - 400 K/uL 140  158  142     Lab Results  Component Value Date   CHOL 124 12/10/2023   HDL 38 (L) 12/10/2023   LDLCALC 69 12/10/2023   TRIG 89 12/10/2023   CHOLHDL 3.3 12/10/2023    Lab Results  Component Value Date   TSH 0.470 02/05/2024    Lab Results  Component Value Date   HGBA1C 6.6 (H) 12/10/2023    Cardiac Panel (last 3 results) No results for input(s): CKTOTAL, CKMB, TROPONINIHS, RELINDX in the last 72 hours.  BNP (last 3 results) No results for input(s): BNP in the last 8760 hours.  ProBNP (last 3 results) Recent Labs    02/05/24 0948  PROBNP 1,876.0*     Intake/Output Summary (Last 24 hours) at 02/06/2024 1500 Last data filed at 02/05/2024 2214 Gross per 24 hour  Intake --  Output 2200 ml  Net -2200 ml    Net IO Since Admission: -2,200 mL [02/06/24 1500]  Tele/EKG/Cardiac studies    Telemetry: NSR  Echocardiogram 02/06/2024:   Dilated LV, severe diffuse hypokinesis, EF 25 to 30%-preliminary.  Radiology   DG Chest Port 1 View Result  Date: 02/05/2024 EXAM: 1 VIEW(S) XRAY OF THE CHEST 02/05/2024 09:20:00 AM COMPARISON: 09/27/2022 CLINICAL HISTORY: sob FINDINGS: LUNGS AND PLEURA: Mild diffuse interstitial opacities. No pleural effusion. No pneumothorax. HEART AND MEDIASTINUM: Cardiomegaly. BONES AND SOFT TISSUES: No acute osseous abnormality. IMPRESSION: 1. Mild diffuse interstitial opacities. 2. Cardiomegaly. Electronically signed by: Evalene Coho MD 02/05/2024 09:25 AM EST RP Workstation: [id number]     Current Meds:    Current Medications[1]  Assessment & Plan .     1.  Atypical atrial flutter with 2: 1 conduction S/P cardioversion to sinus rhythm 02/05/2024 2.  Acute on chronic systolic and diastolic heart failure secondary to A-fib with RVR, probably symptoms may have been oncoming for the past 1 month per history. 3.  Paroxysmal atrial fibrillation, with secondary hypercoagulable state. 4.  Primary hypertension 5.  Diabetes mellitus type 2 controlled without complications 6.  Hypercholesterolemia  Recommendations: Patient has recurrence of cardiomyopathy.  His cardiac catheterization was remote, he has significant cardiovascular is factors as noted above but very well-controlled risk factors for now.  I suspect his dilated cardiomyopathy is related to nonischemic etiology, A-fib/atrial flutter contributing to this.  Hence rhythm control of atrial fibrillation is very important.  Patient also in acute decompensated heart failure, needs optimization of his heart failure.  Will discontinue losartan  and switch him to Entresto  moderate dose: 49/51 mg p.o. twice daily and increase his carvedilol  to 25 mg twice daily.  Continue Farxiga  and add spironolactone .  Follow-up on serum creatinine in the morning.  Blood pressure is well-controlled and hopefully he will continue to tolerate the above medications as well.  Diabetes mellitus is now well-controlled since being on GLP-1 agonist.  Lipids also well-controlled on present  statin therapy with 40 mg of Lipitor.   For questions or updates, please contact [facility] Please consult [email or web address] for contact info under        Signed,   Gordy Bergamo, MD, [facility] 02/06/2024, 3:00 PM [address] [facility] [address] [place], [place]  [address] Phone: [phone]. Fax:  [phone]      [1]  Current Facility-Administered Medications:    acetaminophen  (TYLENOL ) tablet 1,000 mg, 1,000 mg, Oral, PRN, Manon Jester, DO, 1,000 mg at 02/06/24 0229   apixaban  (ELIQUIS ) tablet 5 mg, 5 mg, Oral, BID, Yanuck, Solomon, DO, 5 mg at 02/06/24 0935   atorvastatin  (LIPITOR) tablet 40 mg, 40 mg, Oral, Daily, Yanuck, Solomon, DO, 40 mg at 02/06/24 0935   carvedilol  (COREG ) tablet 12.5 mg, 12.5 mg, Oral, BID, Yanuck, Solomon, DO, 12.5 mg at 02/06/24 0935   cyanocobalamin  (VITAMIN B12) tablet 1,000 mcg, 1,000 mcg, Oral, Daily, Yanuck, Solomon, DO, 1,000 mcg at 02/06/24 0935   dapagliflozin  propanediol (FARXIGA ) tablet 10 mg, 10 mg, Oral, QAC breakfast, Manon Jester, DO, 10 mg at 02/06/24 0935   ipratropium-albuterol  (DUONEB) 0.5-2.5 (3) MG/3ML nebulizer solution 3 mL, 3 mL, Nebulization, Q6H PRN, Nygaard, Joseph, MD, 3 mL at 02/06/24 0552   lidocaine  (LIDODERM ) 5 % 1 patch, 1 patch, Transdermal, Q24H, Boyina, Akhila, MD, 1 patch at 02/06/24 0630   losartan  (COZAAR ) tablet 25 mg, 25 mg, Oral, Daily, Yanuck, Solomon, DO, 25 mg at 02/06/24 0934   sodium chloride  flush (NS) 0.9 % injection 3 mL, 3 mL, Intravenous, Q12H, Yanuck, Solomon, DO, 3 mL at 02/05/24 1527

## 2024-02-06 NOTE — Plan of Care (Signed)
 FMTS Interim Progress Note  S:Visited patient at the bedside after seeing nursing note that he was short of breath.  Nurse said that she put patient on 3L due to his concern that he was short of breath but that he was saturating above 92% at the time.   Patient does not have diagnosis of COPD but does have extensive past tobacco history and CT early this year in February showed Centrilobular and paraseptal emphysema. Smoking related respiratory bronchiolitis. Bibasilar pleuroparenchymal scarring. 3.7 mm right upper lobe nodule.   Dyspnea does not worsen while flat; though, patient says he does not sleep flat anymore either.   Denies chest pain, light headedness, diaphoresis.   He is very eager to go home today and says he will go home this evening regardless.    O: BP 102/84   Pulse 94   Temp 98.8 F (37.1 C) (Oral)   Resp (!) 24   Wt 120 kg   SpO2 97%   BMI 36.90 kg/m   General: well appearing, in no acute distress  CV: RRR  Resp: slightly increased work of breathing, takes breaks between sentences, mild expiratory wheeze at the bases.    A/P: Dyspnea  Broad ddx including afib, chf, or copd. HR looks RRR on the telemetry and patient has had lot of urinary output it would be odd for CHF or afib to cause the dyspnea now after improvement of both of these. Given the wheeze on exam more likely related to possible underlying COPD. Less likely PE given patient is not tacycardic, hypotensive, or hypoxic and has been on his eliquis .  - asked nurse to give duoneb  - EKG   Nicholas Bar, MD 02/06/2024, 5:50 AM PGY-3, [facility] pager [phone]

## 2024-02-06 NOTE — Assessment & Plan Note (Signed)
 Some tachypnea early this AM, improved w/ prn duonebs No f/u weight -2L net Cr stable Mg improved TSH WNL Trop downtrending CXR suggests chf: CXR w/ diffuse interstitial opacities and cardiomegaly NSR on f/u ekg    Dyspnea x 2 weeks w/ acute worsening over the last 48 hours. S/p successful cardioversion for Afib w/ RvR in the ED. BNP 1876. Trop borderline and trending. Appears to be at dry weight (~121kg)  -Admit to FMTS, progressive floor, attending Dr Delores -Continuous cardiac monitoring -Pulse ox w/ vitals -Lasix  40mg  IV once -Cardiology consulted, appreciate their recs -Echo pending -PM EKG @ 1700 -Continue home carvedilol  12.5 mg daily -Continue home eliquis  5mg  BID -Strict I/Os -Daily weights -PT/OT eval and treat -TSH pending  -AM BMP

## 2024-02-06 NOTE — Discharge Summary (Shared)
 Family Medicine Teaching [facility] Discharge Summary  Patient name: Shawn Perkins Medical record number: [id number] Date of birth: [birth year] Age: 68 y.o. Gender: male Date of Admission: 02/05/2024  Date of Discharge: 02-07-24 Admitting Physician: Leafy Scriver, DO  Primary Care Provider: Delores Suzann HERO, MD Consultants: Cardiology  Indication for Hospitalization: CHF exacerbation  Discharge Diagnoses/Problem List:  Principal Problem for Admission: CHF exacerbation Other Problems addressed during stay:  Principal Problem:   Acute congestive heart failure [facility]) Active Problems:   Primary hypertension   Acute on chronic diastolic heart failure (HCC)   Atrial fibrillation with rapid ventricular response [facility])   Brief Hospital Course:  RB is a 68 year old M with history of Afib s/p previous ablation, HFrE due to NICMF, that is admitted for A-fib RVR and CHF exacerbation. He was admitted to [facility] Teaching Service.  Afib RVR  CHF Exacerbation SOB Presented to ED with 1 day history of shortness of breath, found to be in A-fib RVR but maintaining MAPs. Underwent cardioversion w/ EDP. Cardiology consulted, patient converted back to sinus rhythm. They recommended patient be continued on his home GDMT of farxiga , losartan . He is not on spironolactone  or entresto  since he has not tolerated it in the past due to hyperkalemia. Pt also diuresed w/ IV lasix . Echo showed decreased EF to 25-30% from previous echo on 01/2023 w EF 50-55%. Patient was continued on his home eliquis  and carvedilol . Cardiology also recommended sleep study as he is high risk for OSA and could be increasing his risk of Afib and worsening HF; however, patient declined. Cardiology switched losartan  to Entresto , added spironolactone , and increased coreg  to optimize GDMT; farxiga  continued. Entresto  was started at half dose given history of low Bps and hyperkalemia.   Wheezing heard on exam and with patients history of  smoking and emphysema seen on imaging, started patient on controller inhaler. Patient is inhaler niave. He tolerated the inhaler well.     Other chronic conditions were medically managed with home medications and formulary alternatives as necessary (T2DM, hypertension, HLD)  PCP Follow-up Recommendations: Ensure cardiology follow up Follow-up controller inhaler for likely COPD - consider PFTs He will need BMP in 2-3 weeks at follow-up for history of hyperkalemia Entresto  started at half dose, consider increasing if patient is tolerating Lasix  daily was started, consider titrating dose based on symptoms    Results/Tests Pending at Time of Discharge: None   Disposition: Home  Discharge Condition: Stable, breathing room  Discharge Exam:  Vitals:   02/07/24 0837 02/07/24 1240  BP: 105/68 107/75  Pulse: 80 79  Resp: 17 16  Temp: 98.3 F (36.8 C) 97.6 F (36.4 C)  SpO2: 96% 97%   General: Awake, alert, NAD. Communicates clearly. Cardio: RRR. Normal S1, S2. No murmur, rub, gallop. 2+ radial and dorsalis pedis pulses b/l w/ good capillary refill. No LE edema appreciated.  Resp: Soft expiratory wheezes appreciated bilaterally. Normal wob on room air. Abdomen: soft, non-tender, non-distended.   Significant Procedures: Cardioversion  Significant Labs and Imaging:  No results for input(s): WBC, HGB, HCT, PLT in the last 48 hours.  Recent Labs  Lab 02/05/24 1540 02/06/24 0234 02/07/24 0712  NA  --  140 140  K  --  4.3 4.7  CL  --  106 105  CO2  --  20* 29  GLUCOSE  --  110* 94  BUN  --  27* 35*  CREATININE  --  1.16 1.34*  CALCIUM   --  9.3 9.7  MG  1.6* 2.5* 2.6*  ALKPHOS 74  --   --   AST 14*  --   --   ALT 29  --   --   ALBUMIN 3.5  --   --     CXR  IMPRESSION: 1. Mild diffuse interstitial opacities. 2. Cardiomegaly.   Discharge Medications:  Allergies as of 02/07/2024       Reactions   Penicillins Other (See Comments)   Childhood reaction         Medication List     PAUSE taking these medications    Klor-Con  M20 20 MEQ tablet Wait to take this until your doctor or other care provider tells you to start again. Generic drug: potassium chloride  SA TAKE 1 TABLET (20 MEQ TOTAL) BY MOUTH DAILY AS NEEDED ON THE DAYS THAT YOU TAKE LASIX  (FUROSEMIDE )       STOP taking these medications    losartan  25 MG tablet Commonly known as: COZAAR    Semaglutide (0.25 or 0.5MG /DOS) 2 MG/1.5ML Sopn       TAKE these medications    acetaminophen  500 MG tablet Commonly known as: TYLENOL  Take 1,000 mg by mouth as needed (BACK PAIN.).   albuterol  108 (90 Base) MCG/ACT inhaler Commonly known as: VENTOLIN  HFA Inhale 2 puffs into the lungs every 6 (six) hours as needed for wheezing or shortness of breath.   Anoro Ellipta  62.5-25 MCG/ACT Aepb Generic drug: umeclidinium-vilanterol Inhale 1 puff into the lungs daily.   apixaban  5 MG Tabs tablet Commonly known as: ELIQUIS  Take 1 tablet (5 mg total) by mouth 2 (two) times daily.   atorvastatin  40 MG tablet Commonly known as: Lipitor Take 1 tablet (40 mg total) by mouth daily.   BD Pen Needle Nano 2nd Gen 32G X 4 MM Misc Generic drug: Insulin Pen Needle Use to inject once weekly   Blood Pressure Cuff Misc Please check and monitor blood pressure daily   carvedilol  25 MG tablet Commonly known as: COREG  Take 1 tablet (25 mg total) by mouth 2 (two) times daily. What changed:  medication strength how much to take   cyanocobalamin  1000 MCG tablet Commonly known as: VITAMIN B12 Take 1 tablet (1,000 mcg total) by mouth daily.   Farxiga  10 MG Tabs tablet Generic drug: dapagliflozin  propanediol TAKE 1 TABLET BY MOUTH DAILY BEFORE BREAKFAST.   furosemide  40 MG tablet Commonly known as: LASIX  Take 1 tablet (40 mg total) by mouth daily. What changed:  when to take this reasons to take this   sacubitril -valsartan  24-26 MG Commonly known as: Entresto  Take 1 tablet by mouth 2  (two) times daily.   spironolactone  25 MG tablet Commonly known as: ALDACTONE  Take 1 tablet (25 mg total) by mouth daily.        Discharge Instructions: Please refer to Patient Instructions section of EMR for full details.  Patient was counseled important signs and symptoms that should prompt return to medical care, changes in medications, dietary instructions, activity restrictions, and follow up appointments.   Follow-Up Appointments: Future Appointments  Date Time Provider Department Center  02/11/2024  2:10 PM Elicia Hamlet, MD [facility]  02/15/2024  8:30 AM Terra Fairy PARAS, PA-C [facility]  02/19/2024  9:00 AM GI-315 CT 2 GI-315CT [facility] [facility]  02/25/2024  1:30 PM Darryle Thom CROME, PA-C [facility] H&V  03/27/2024  2:10 PM [facility] ANNUAL WELLNESS VISIT [facility]  08/11/2024  9:00 AM Orman Erminio POUR, PA-C [facility] None  01/14/2025  8:20 AM Thapa, Sudan, MD [facility] None  Lonnie Mahnoor, MD 02/07/2024, 2:27 PM PGY-2, [facility]

## 2024-02-06 NOTE — Telephone Encounter (Signed)
 Pharmacy Patient Advocate Encounter  Insurance verification completed.    The patient is insured through [employer]. Patient has Medicare and is not eligible for a copay card, but may be able to apply for patient assistance or Medicare RX Payment Plan (Patient Must reach out to their plan, if eligible for payment plan), if available.    Ran test claim for Anoro Ellipta  62.5-25mcg and the current 30 day co-pay is $0.   This test claim was processed through [facility]- copay amounts may vary at other pharmacies due to [employer], or as the patient moves through the different stages of their insurance plan.

## 2024-02-06 NOTE — Plan of Care (Signed)
   Problem: Education: Goal: Knowledge of General Education information will improve Description Including pain rating scale, medication(s)/side effects and non-pharmacologic comfort measures Outcome: Progressing   Problem: Health Behavior/Discharge Planning: Goal: Ability to manage health-related needs will improve Outcome: Progressing

## 2024-02-06 NOTE — ED Notes (Signed)
 Patient reported feeling more short of breath and asking for supplemental O2. Noted to be 88-90% on RA. Placed on 3L nasal cannula.

## 2024-02-06 NOTE — Assessment & Plan Note (Addendum)
 T2DM:  -Last A1c 6.6.  -Continue home farxiga  -TID CBG monitoring HTN: Continue home losartan  Dyslipidemia: continue home lipitor B12 deficiency: continue home b12

## 2024-02-06 NOTE — Evaluation (Signed)
 Physical Therapy Brief Evaluation and Discharge Note Patient Details Name: Shawn Perkins MRN: [id number] DOB: 05/04/1955 Today's Date: 02/06/2024   History of Present Illness  68 yo M adm 12/16 dyspnea PMH tobacco use hx, centriobular and paraseptal emphysema HF HFrEF ue to NICM, L adrenal mass, ETOH abuse remote, afib  Clinical Impression  Pt doing well with mobility and no further PT needed.  Ready for dc from PT standpoint. SpO2 94-100% at rest and with ambulation.         PT Assessment Patient does not need any further PT services  Assistance Needed at Discharge  None    Equipment Recommendations None recommended by PT  Recommendations for Other Services       Precautions/Restrictions Precautions Precautions: None Restrictions Weight Bearing Restrictions Per Provider Order: No        Mobility  Bed Mobility   Supine/Sidelying to sit: Independent Sit to supine/sidelying: Independent    Transfers Overall transfer level: Independent Equipment used: None                    Ambulation/Gait Ambulation/Gait assistance: Independent Gait Distance (Feet): 175 Feet Assistive device: None Gait Pattern/deviations: WFL(Within Functional Limits) Gait Speed: Pace WFL General Gait Details: Steady gait  Home Activity Instructions    Stairs            Modified Rankin (Stroke Patients Only)        Balance Overall balance assessment: No apparent balance deficits (not formally assessed)                        Pertinent Vitals/Pain PT - Brief Vital Signs All Vital Signs Stable: Yes Pain Assessment Pain Assessment: No/denies pain     Home Living Family/patient expects to be discharged to:: Private residence Living Arrangements: Alone   Home Environment: Level entry   Home Equipment: Cane - single point        Prior Function Level of Independence: Independent with assistive device(s) Comments: uses cane at times if his back is  bothering him    UE/LE Assessment   UE ROM/Strength/Tone/Coordination: WFL    LE ROM/Strength/Tone/Coordination: [facility]      Communication   Communication Communication: No apparent difficulties     Cognition Overall Cognitive Status: Appears within functional limits for tasks assessed/performed       General Comments      Exercises     Assessment/Plan    PT Problem List         PT Visit Diagnosis Other (comment)    No Skilled PT Patient is independent with all acitivity/mobility   Co-evaluation                AMPAC 6 Clicks Help needed turning from your back to your side while in a flat bed without using bedrails?: None Help needed moving from lying on your back to sitting on the side of a flat bed without using bedrails?: None Help needed moving to and from a bed to a chair (including a wheelchair)?: None Help needed standing up from a chair using your arms (e.g., wheelchair or bedside chair)?: None Help needed to walk in hospital room?: None Help needed climbing 3-5 steps with a railing? : None 6 Click Score: 24      End of Session   Activity Tolerance: Patient tolerated treatment well Patient left: in bed;with call bell/phone within reach Nurse Communication: Mobility status PT Visit Diagnosis: Other (comment)  Time: 8954-8946 PT Time Calculation (min) (ACUTE ONLY): 8 min  Charges:   PT Evaluation $PT Eval Low Complexity: 1 Low      [facility] PT Acute Rehabilitation Services Office [phone]   [facility] ORN [facility]  02/06/2024, 11:00 AM

## 2024-02-06 NOTE — ED Notes (Signed)
 Patient reporting that supplemental O2 not helping him feel any better. Feels as though he is not moving enough air.  Patient still NSR on monitor, sating well on 3L nasal cannula.  Attending paged by unit secretary.

## 2024-02-06 NOTE — Progress Notes (Signed)
 Echocardiogram 2D Echocardiogram has been performed.  Shawn Perkins 02/06/2024, 3:56 PM

## 2024-02-07 ENCOUNTER — Other Ambulatory Visit (HOSPITAL_COMMUNITY): Payer: Self-pay

## 2024-02-07 ENCOUNTER — Encounter (HOSPITAL_COMMUNITY): Payer: Self-pay

## 2024-02-07 DIAGNOSIS — I5041 Acute combined systolic (congestive) and diastolic (congestive) heart failure: Secondary | ICD-10-CM | POA: Diagnosis not present

## 2024-02-07 DIAGNOSIS — I428 Other cardiomyopathies: Secondary | ICD-10-CM

## 2024-02-07 LAB — BASIC METABOLIC PANEL WITH GFR
Anion gap: 7 (ref 5–15)
BUN: 35 mg/dL — ABNORMAL HIGH (ref 8–23)
CO2: 29 mmol/L (ref 22–32)
Calcium: 9.7 mg/dL (ref 8.9–10.3)
Chloride: 105 mmol/L (ref 98–111)
Creatinine, Ser: 1.34 mg/dL — ABNORMAL HIGH (ref 0.61–1.24)
GFR, Estimated: 58 mL/min — ABNORMAL LOW (ref >60)
Glucose, Bld: 94 mg/dL (ref 70–99)
Potassium: 4.7 mmol/L (ref 3.5–5.1)
Sodium: 140 mmol/L (ref 135–145)

## 2024-02-07 LAB — MAGNESIUM: Magnesium: 2.6 mg/dL — ABNORMAL HIGH (ref 1.7–2.4)

## 2024-02-07 MED ORDER — SACUBITRIL-VALSARTAN 24-26 MG PO TABS
1.0000 | ORAL_TABLET | Freq: Two times a day (BID) | ORAL | 0 refills | Status: DC
  Filled 2024-02-07: qty 60, 30d supply, fill #0

## 2024-02-07 MED ORDER — ALBUTEROL SULFATE HFA 108 (90 BASE) MCG/ACT IN AERS
2.0000 | INHALATION_SPRAY | Freq: Four times a day (QID) | RESPIRATORY_TRACT | 2 refills | Status: DC | PRN
  Filled 2024-02-07: qty 6.7, 25d supply, fill #0

## 2024-02-07 MED ORDER — SEMAGLUTIDE(0.25 OR 0.5MG/DOS) 2 MG/1.5ML ~~LOC~~ SOPN
0.5000 mg | PEN_INJECTOR | SUBCUTANEOUS | 3 refills | Status: DC
  Filled 2024-02-07: qty 3, 56d supply, fill #0

## 2024-02-07 MED ORDER — SPIRONOLACTONE 25 MG PO TABS
25.0000 mg | ORAL_TABLET | Freq: Every day | ORAL | 0 refills | Status: DC
  Filled 2024-02-07: qty 30, 30d supply, fill #0

## 2024-02-07 MED ORDER — CARVEDILOL 25 MG PO TABS
25.0000 mg | ORAL_TABLET | Freq: Two times a day (BID) | ORAL | 0 refills | Status: DC
  Filled 2024-02-07: qty 60, 30d supply, fill #0

## 2024-02-07 MED ORDER — FUROSEMIDE 40 MG PO TABS
40.0000 mg | ORAL_TABLET | Freq: Every day | ORAL | 1 refills | Status: AC
  Filled 2024-02-07: qty 90, 90d supply, fill #0

## 2024-02-07 MED ORDER — UMECLIDINIUM-VILANTEROL 62.5-25 MCG/ACT IN AEPB
1.0000 | INHALATION_SPRAY | Freq: Every day | RESPIRATORY_TRACT | 0 refills | Status: DC
  Filled 2024-02-07: qty 60, 30d supply, fill #0

## 2024-02-07 NOTE — Assessment & Plan Note (Deleted)
 VSS Regular HR on exam I/Os not charted Cr uptrending to 1.3 from 1.16 K 4.7 Losartan  -> entresto  and spiro      2L UOP last 24 hours. - Continue cardiac monitoring  -Lasix  40mg  IV once more today -Cardiology consulted, patient dispo pending their recs -Echo pending -Continue home carvedilol  12.5 mg daily -Continue home eliquis  5mg  BID -Strict I/Os -Daily weights -TSH WNL

## 2024-02-07 NOTE — TOC CM/SW Note (Signed)
 Transition of Care [facility]) - Inpatient Brief Assessment   Patient Details  Name: Shawn Perkins MRN: [id number] Date of Birth: Feb 21, 1956  Transition of Care [facility]) CM/SW Contact:    Waddell Barnie Rama, RN Phone Number: 02/07/2024, 12:30 PM   Clinical Narrative: Transition of Care [facility]) - Inpatient Brief Assessment   Patient Details  Name: Shawn Perkins MRN: [id number] Date of Birth: Apr 30, 1955  Transition of Care [facility]) CM/SW Contact:    Waddell Barnie Rama, RN Phone Number: 02/07/2024, 12:30 PM   Clinical Narrative: From home alone, indep, has PCP and insurance on file, states has no HH services in place at this time or DME at home.  States he will need a cab to transport him  home at [employer], states gets medications from [facility] on [address]  Pta self ambulatory.   There are no ICM needs identified  at this time.  Please place consult for ICM  needs.     Transition of Care Asessment: Insurance and Status: Insurance coverage has been reviewed Patient has primary care physician: Yes Home environment has been reviewed: home alone Prior level of function:: indep Prior/Current Home Services: No current home services Social Drivers of Health Review: SDOH reviewed no interventions necessary Readmission risk has been reviewed: Yes Transition of care needs: no transition of care needs at this time    Transition of Care Asessment: Insurance and Status: Insurance coverage has been reviewed Patient has primary care physician: Yes Home environment has been reviewed: home alone Prior level of function:: indep Prior/Current Home Services: No current home services Social Drivers of Health Review: SDOH reviewed no interventions necessary Readmission risk has been reviewed: Yes Transition of care needs: no transition of care needs at this time

## 2024-02-07 NOTE — Progress Notes (Signed)
 Patient Name: Shawn Perkins Date of Encounter: 02/07/2024 [facility] Cardiologist:  Gordy Bergamo, MD 02/05/2024 .admit Length of stay: 2  Interval Summary  .    Shawn Perkins is a 68 y.o. male with h/o AF, HTN, HFrEF due to NICM, left adrenal mass, tobacco abuse, prior ETOH abuse quit in 2022, also quit smoking tobacco, heart failure with recovered LVEF for nonischemic cardiomyopathy from A-fib SP atrial fibrillation ablation in April 2025 now admitted to the hospital with worsening dyspnea on exertion for the past 1 month.    He has been compliant with anticoagulation, presented to the emergency room on 02/05/2024 and underwent cardioversion for atypical atrial flutter back to sinus rhythm.   He is presently doing well, states that his dyspnea has improved.  No recurrence of atrial fibrillation/flutter on telemetry.  Physical Exam    Vitals:   02/06/24 1700 02/06/24 2004 02/07/24 0045 02/07/24 0423  BP: 114/79 113/81 107/75 114/84  Pulse: 82 77 87 78  Resp: 20 19 18 19   Temp: 98.4 F (36.9 C) 98 F (36.7 C) 97.9 F (36.6 C) (!) 97.5 F (36.4 C)  TempSrc: Oral Oral Oral Oral  SpO2: 95% 98%  94%  Weight:    117.1 kg  Height:       Physical Exam Constitutional:      Appearance: He is obese.  Neck:     Vascular: No carotid bruit or JVD.  Cardiovascular:     Rate and Rhythm: Normal rate and regular rhythm.     Pulses: Intact distal pulses.     Heart sounds: Normal heart sounds. No murmur heard.    No gallop.  Pulmonary:     Effort: Pulmonary effort is normal.     Breath sounds: Examination of the right-lower field reveals rhonchi. Rhonchi present.  Abdominal:     General: Bowel sounds are normal.     Palpations: Abdomen is soft.  Musculoskeletal:     Right lower leg: No edema.     Left lower leg: No edema.        02/07/2024    4:23 AM 02/06/2024   12:38 PM 02/05/2024   12:15 PM  Last 3 Weights  Weight (lbs) 258 lb 3.2 oz 264 lb 8.8 oz 264 lb 8.8 oz   Weight (kg) 117.119 kg 120 kg 120 kg      Labs   Lab Results  Component Value Date   NA 140 02/06/2024   K 4.3 02/06/2024   CO2 20 (L) 02/06/2024   GLUCOSE 110 (H) 02/06/2024   BUN 27 (H) 02/06/2024   CREATININE 1.16 02/06/2024   CALCIUM  9.3 02/06/2024   GFR 94.35 05/30/2013   EGFR 58 (L) 05/21/2023   GFRNONAA >60 02/06/2024      Latest Ref Rng & Units 02/06/2024    2:34 AM 02/05/2024    9:48 AM 07/18/2023    2:31 PM  BMP  Glucose 70 - 99 mg/dL 889  84  895   BUN 8 - 23 mg/dL 27  16  16    Creatinine 0.61 - 1.24 mg/dL 8.83  8.87  8.62   Sodium 135 - 145 mmol/L 140  144  140   Potassium 3.5 - 5.1 mmol/L 4.3  4.3  4.2   Chloride 98 - 111 mmol/L 106  112  110   CO2 22 - 32 mmol/L 20  19  25    Calcium  8.9 - 10.3 mg/dL 9.3  9.2  9.2  Latest Ref Rng & Units 02/05/2024    9:48 AM 07/18/2023    2:31 PM 05/21/2023    2:44 PM  CBC  WBC 4.0 - 10.5 K/uL 9.5  7.4  8.1   Hemoglobin 13.0 - 17.0 g/dL 84.9  85.5  85.1   Hematocrit 39.0 - 52.0 % 47.1  42.7  44.2   Platelets 150 - 400 K/uL 140  158  142     Lab Results  Component Value Date   CHOL 124 12/10/2023   HDL 38 (L) 12/10/2023   LDLCALC 69 12/10/2023   TRIG 89 12/10/2023   CHOLHDL 3.3 12/10/2023    Lab Results  Component Value Date   TSH 0.470 02/05/2024    Lab Results  Component Value Date   HGBA1C 6.6 (H) 12/10/2023  ProBNP (last 3 results) Recent Labs    02/05/24 0948  PROBNP 1,876.0*     Intake/Output Summary (Last 24 hours) at 02/07/2024 9347 Last data filed at 02/06/2024 2137 Gross per 24 hour  Intake 249 ml  Output 550 ml  Net -301 ml    Net IO Since Admission: -2,501 mL [02/07/24 0652]  Tele/EKG/Cardiac studies    Telemetry: NSR  Echocardiogram 02/06/2024:   1. Left ventricular ejection fraction, by estimation, is 25 to 30%. The left ventricle has severely decreased function. The left ventricle demonstrates global hypokinesis. The left ventricular internal cavity size was  moderately dilated. Left ventricular diastolic parameters are indeterminate.  2. Right ventricular systolic function is moderately reduced. The right ventricular size is normal.  3. Left atrial size was severely dilated.  4. Right atrial size was moderately dilated.  5. The mitral valve is normal in structure. Mild to moderate mitral valve regurgitation. No evidence of mitral stenosis.  6. Severe tricuspid stenosis.  7. The aortic valve is tricuspid. There is mild calcification of the aortic valve. Aortic valve regurgitation is trivial. Aortic valve sclerosis/calcification is present, without any evidence of aortic stenosis.  8. Aortic dilatation noted. There is borderline dilatation of the aortic root, measuring 38 mm. There is mild dilatation of the ascending aorta, measuring 40 mm.  9. The inferior vena cava is normal in size with greater than 50% respiratory variability, suggesting right atrial pressure of 3 mmHg.  Radiology   DG Chest Port 1 View 02/05/2024 EXAM: 1 VIEW(S) XRAY OF THE CHEST 02/05/2024 09:20:00 AM COMPARISON: 09/27/2022 CLINICAL HISTORY: sob FINDINGS: LUNGS AND PLEURA: Mild diffuse interstitial opacities. No pleural effusion. No pneumothorax. HEART AND MEDIASTINUM: Cardiomegaly. BONES AND SOFT TISSUES: No acute osseous abnormality. IMPRESSION: 1. Mild diffuse interstitial opacities. 2. Cardiomegaly.  Current Meds:    Current Medications[1]  Assessment & Plan .     1.  Atypical atrial flutter with 2: 1 conduction S/P cardioversion to sinus rhythm 02/05/2024, h/o PAF on chronic Eliquis  2.  Acute on chronic systolic and diastolic heart failure secondary to A-fib with RVR, probably symptoms may have been oncoming for the past 1 month per history. 3.  Paroxysmal atrial fibrillation, with secondary hypercoagulable state. 4.  Primary hypertension 5. Suspected sleep apnea  Recommendations: Patient is now on GDP, continue present medications, he can be discharged from cardiac  standpoint.  He still has volume overload state, however this can be dealt with in the outpatient basis.  Will arrange for outpatient follow-up for heart failure which is the predominant issue presently.  Again discussed regarding sleep apnea, he appears to be more motivated to having this done but he would like to have some  time as he has multiple appointments in future.  He is maintaining sinus rhythm with regard to paroxysmal atrial fibrillation and atypical atrial flutter and blood pressure is also not well-controlled and renal function has remained stable in spite of addition of the above medications. Medication upon discharge or the present medications and present dosage and he will need BMP in 2-3 weeks at follow-up.  For questions or updates, please contact [facility] Please consult [email or web address] for contact info under   Signed,   Gordy Bergamo, MD, [facility] 02/07/2024, 6:52 AM [facility] [address] [place], [place] [address] Phone: [phone]. Fax:  [phone]       [1]  Current Facility-Administered Medications:    acetaminophen  (TYLENOL ) tablet 1,000 mg, 1,000 mg, Oral, PRN, Manon Jester, DO, 1,000 mg at 02/06/24 0229   apixaban  (ELIQUIS ) tablet 5 mg, 5 mg, Oral, BID, Yanuck, Solomon, DO, 5 mg at 02/06/24 2136   atorvastatin  (LIPITOR) tablet 40 mg, 40 mg, Oral, Daily, Yanuck, Solomon, DO, 40 mg at 02/06/24 0935   carvedilol  (COREG ) tablet 25 mg, 25 mg, Oral, BID, Cheyna Retana, MD, 25 mg at 02/06/24 2136   cyanocobalamin  (VITAMIN B12) tablet 1,000 mcg, 1,000 mcg, Oral, Daily, Yanuck, Solomon, DO, 1,000 mcg at 02/06/24 0935   dapagliflozin  propanediol (FARXIGA ) tablet 10 mg, 10 mg, Oral, QAC breakfast, Manon Jester, DO, 10 mg at 02/06/24 0935   ipratropium-albuterol  (DUONEB) 0.5-2.5 (3) MG/3ML nebulizer solution 3 mL, 3 mL, Nebulization, Q6H PRN, Nygaard, Joseph, MD, 3 mL at 02/06/24 9447   lidocaine  (LIDODERM ) 5 % 1 patch, 1 patch, Transdermal, Q24H,  Boyina, Akhila, MD, 1 patch at 02/06/24 0630   sacubitril -valsartan  (ENTRESTO ) 49-51 mg per tablet, 1 tablet, Oral, BID, Raymon Schlarb, MD   sodium chloride  flush (NS) 0.9 % injection 3 mL, 3 mL, Intravenous, Q12H, Yanuck, Solomon, DO, 3 mL at 02/06/24 2137   spironolactone  (ALDACTONE ) tablet 25 mg, 25 mg, Oral, Daily, Satrina Magallanes, MD, 25 mg at 02/06/24 1601

## 2024-02-07 NOTE — TOC Transition Note (Signed)
 Transition of Care [facility]) - Discharge Note   Patient Details  Name: Shawn Perkins MRN: [id number] Date of Birth: 07-20-1955  Transition of Care [facility]) CM/SW Contact:  Waddell Barnie Rama, RN Phone Number: 02/07/2024, 12:31 PM   Clinical Narrative:    For dc today, he will need a cab for transport at dc.           Patient Goals and CMS Choice            Discharge Placement                       Discharge Plan and Services Additional resources added to the After Visit Summary for                                       Social Drivers of Health (SDOH) Interventions SDOH Screenings   Food Insecurity: No Food Insecurity (02/06/2024)  Housing: Low Risk (02/06/2024)  Transportation Needs: No Transportation Needs (02/06/2024)  Utilities: Not At Risk (02/06/2024)  Alcohol Screen: Low Risk (03/26/2023)  Depression (PHQ2-9): Low Risk (12/24/2023)  Financial Resource Strain: Low Risk (03/26/2023)  Physical Activity: Sufficiently Active (03/26/2023)  Social Connections: Moderately Integrated (02/06/2024)  Stress: No Stress Concern Present (03/26/2023)  Tobacco Use: Medium Risk (02/05/2024)  Health Literacy: Adequate Health Literacy (03/26/2023)     Readmission Risk Interventions    02/07/2024   12:29 PM  Readmission Risk Prevention Plan  Post Dischage Appt Complete  Medication Screening Complete  Transportation Screening Complete

## 2024-02-07 NOTE — Plan of Care (Signed)
   Problem: Education: Goal: Knowledge of General Education information will improve Description Including pain rating scale, medication(s)/side effects and non-pharmacologic comfort measures Outcome: Progressing

## 2024-02-07 NOTE — Progress Notes (Signed)
 Patient is ready for discharge and was given discharge instructions. Verbalized understanding and all questions were answered. TOC meds and all of his belongings are with him. RN is transporting him to be picked up by taxi.   Aaiden Depoy BSN RN

## 2024-02-07 NOTE — Assessment & Plan Note (Deleted)
 BG in range   T2DM:  -Last A1c 6.6.  -Continue home farxiga  -TID CBG monitoring HTN: Continue home losartan  Dyslipidemia: continue home lipitor B12 deficiency: continue home b12

## 2024-02-08 ENCOUNTER — Telehealth: Payer: Self-pay

## 2024-02-08 NOTE — Transitions of Care (Post Inpatient/ED Visit) (Signed)
" ° °  02/08/2024  Name: RONDAL VANDEVELDE MRN: [id number] DOB: Dec 25, 1955  Today's TOC FU Call Status: Today's TOC FU Call Status:: Unsuccessful Call (2nd Attempt) Unsuccessful Call (2nd Attempt) Date: 02/08/24  Attempted to reach the patient regarding the most recent Inpatient/ED visit.  Follow Up Plan: Additional outreach attempts will be made to reach the patient to complete the Transitions of Care (Post Inpatient/ED visit) call.    Bing Edison MSN, RN RN [employer]  [facility] Office Hours M-F [phone] Direct Dial: [phone] Main Phone [phone]  Fax: [phone] .com    "

## 2024-02-08 NOTE — Transitions of Care (Post Inpatient/ED Visit) (Signed)
" ° °  02/08/2024  Name: Shawn Perkins MRN: [id number] DOB: 1955-06-26  Today's TOC FU Call Status: Today's TOC FU Call Status:: Unsuccessful Call (1st Attempt) Unsuccessful Call (1st Attempt) Date: 02/08/24  Attempted to reach the patient regarding the most recent Inpatient/ED visit.  TOC RN CM left a confidential voice message.   Follow Up Plan: Additional outreach attempts will be made to reach the patient to complete the Transitions of Care (Post Inpatient/ED visit) call.    Bing Edison MSN, RN RN [employer]  [facility] Office Hours M-F [phone] Direct Dial: [phone] Main Phone [phone]  Fax: [phone] [email or web address]    "

## 2024-02-09 ENCOUNTER — Other Ambulatory Visit (HOSPITAL_COMMUNITY): Payer: Self-pay | Admitting: Family Medicine

## 2024-02-11 ENCOUNTER — Telehealth: Payer: Self-pay

## 2024-02-11 ENCOUNTER — Inpatient Hospital Stay: Payer: Self-pay | Admitting: Family Medicine

## 2024-02-11 NOTE — Transitions of Care (Post Inpatient/ED Visit) (Signed)
" ° °  02/11/2024  Name: Shawn Perkins MRN: [id number] DOB: 07-27-1955  Today's TOC FU Call Status: Today's TOC FU Call Status:: Successful TOC FU Call Completed TOC FU Call Complete Date: 02/11/24  Patient's Name and Date of Birth confirmed. Name, DOB  Transition Care Management Follow-up Telephone Call How have you been since you were released from the hospital?: Better Any questions or concerns?: No  Items Reviewed: Did you receive and understand the discharge instructions provided?: Yes Medications obtained,verified, and reconciled?: Yes (Medications Reviewed) (verbally states that he has all his medications,  declined review)  Medications Reviewed Today: verbally reports that he has his medications, declines review.  Medications Reviewed Today   Medications were not reviewed in this encounter      Follow up appointments reviewed:  PCP Follow-up appointment confirmed?: Yes Date of PCP follow-up appointment?: 02/12/24 Follow-up Provider: PCP   Placed call to patient and explained reason for call. Patient reports that he got his discharge instructions and has all his medications. Declines to complete call. States that he will see PCP tomorrow.   Alan Ee, RN, BSN, CEN Population Health- Transition of Care Team.  [facility] [phone]     "

## 2024-02-12 ENCOUNTER — Encounter: Payer: Self-pay | Admitting: Family Medicine

## 2024-02-12 ENCOUNTER — Ambulatory Visit: Payer: Medicare (Managed Care) | Admitting: Family Medicine

## 2024-02-12 VITALS — BP 89/68 | HR 75 | Ht 71.0 in | Wt 255.6 lb

## 2024-02-12 DIAGNOSIS — I4891 Unspecified atrial fibrillation: Secondary | ICD-10-CM

## 2024-02-12 DIAGNOSIS — I10 Essential (primary) hypertension: Secondary | ICD-10-CM | POA: Diagnosis not present

## 2024-02-12 DIAGNOSIS — I509 Heart failure, unspecified: Secondary | ICD-10-CM | POA: Diagnosis not present

## 2024-02-12 MED ORDER — SPIRONOLACTONE 25 MG PO TABS: 12.5000 mg | ORAL_TABLET | Freq: Every day | ORAL | 3 refills | Status: AC

## 2024-02-12 NOTE — Progress Notes (Signed)
" ° ° °  SUBJECTIVE:   CHIEF COMPLAINT / HPI:     Discussed the use of AI scribe software for clinical note transcription with the patient, who gave verbal consent to proceed.  History of Present Illness Shawn Perkins is a 68 year old male with COPD who presents for follow-up after recent hospitalization.  Cardiovascular status - Heart rate stable since discharge - Blood pressure slightly low today - No dizziness, wooziness, or vision darkening upon standing - No chest pain - No symptoms indicative of atrial fibrillation  Respiratory symptoms - Breathing improved since hospitalization - Continues to use inhaler for COPD management  Weight changes and fluid status - Weight decreased from 270 pounds to 255 pounds since hospitalization - Attributes weight loss to increased urination - No peripheral edema     Discharge Summary Recs: Ensure cardiology follow up Follow-up controller inhaler for likely COPD - consider PFTs He will need BMP in 2-3 weeks at follow-up for history of hyperkalemia Entresto  started at half dose, consider increasing if patient is tolerating Lasix  daily was started, consider titrating dose based on symptoms   PERTINENT  PMH / PSH: HFrEF, Afib, T2DM, COPD  OBJECTIVE:   BP (!) 89/68   Pulse 75   Ht 5' 11 (1.803 m)   Wt 255 lb 9.6 oz (115.9 kg)   SpO2 100%   BMI 35.65 kg/m   General: Alert, pleasant man. NAD. HEENT: NCAT. MMM. CV: RRR, no murmurs. Cap refill <2. Resp: CTAB, no wheezing or crackles. Normal WOB on RA.  Abm: Soft, nontender, nondistended. BS present. Ext: Moves all ext spontaneously. No pitting edema Skin: Warm, well perfused     ASSESSMENT/PLAN:   Assessment & Plan Atrial fibrillation, unspecified type (HCC) Essential hypertension Chronic congestive heart failure, unspecified heart failure type (HCC) Stable since discharge. BP soft, but pt is asymptomatic. Will decrease spironolactone  to decrease BP effect. - Spiro 25 to 12.5mg   daily - Cont Entresto . IF BP still low, consider switching to ARB - Cont coreg , farxiga , lasix  - BMP to check potassium     Shawn Nearing, MD [facility] "

## 2024-02-12 NOTE — Patient Instructions (Signed)
 1) Your blood pressure was a little low, so I will decrease your spironolactone  - Take just half a tablet of spironolactone  daily.  - Take all your other medication the same.   2) Follow-up with your cardiologist, they may change your dose.  3) We will check your potassium today.

## 2024-02-13 LAB — BASIC METABOLIC PANEL WITH GFR
BUN/Creatinine Ratio: 16 (ref 10–24)
BUN: 23 mg/dL (ref 8–27)
CO2: 18 mmol/L — ABNORMAL LOW (ref 20–29)
Calcium: 9.6 mg/dL (ref 8.6–10.2)
Chloride: 104 mmol/L (ref 96–106)
Creatinine, Ser: 1.4 mg/dL — ABNORMAL HIGH (ref 0.76–1.27)
Glucose: 88 mg/dL (ref 70–99)
Potassium: 4.7 mmol/L (ref 3.5–5.2)
Sodium: 141 mmol/L (ref 134–144)
eGFR: 55 mL/min/1.73 — ABNORMAL LOW

## 2024-02-13 NOTE — Assessment & Plan Note (Signed)
 Stable since discharge. BP soft, but pt is asymptomatic. Will decrease spironolactone  to decrease BP effect. - Spiro 25 to 12.5mg  daily - Cont Entresto . IF BP still low, consider switching to ARB - Cont coreg , farxiga , lasix  - BMP to check potassium

## 2024-02-15 ENCOUNTER — Ambulatory Visit (HOSPITAL_COMMUNITY)
Admit: 2024-02-15 | Discharge: 2024-02-15 | Disposition: A | Discharge status: Home / Self Care | Payer: Medicare (Managed Care) | Attending: Internal Medicine | Admitting: Internal Medicine

## 2024-02-15 VITALS — BP 100/80 | HR 71 | Ht 71.0 in | Wt 258.4 lb

## 2024-02-15 DIAGNOSIS — D6869 Other thrombophilia: Secondary | ICD-10-CM | POA: Diagnosis not present

## 2024-02-15 DIAGNOSIS — I4819 Other persistent atrial fibrillation: Secondary | ICD-10-CM | POA: Diagnosis not present

## 2024-02-15 DIAGNOSIS — I4891 Unspecified atrial fibrillation: Secondary | ICD-10-CM | POA: Diagnosis not present

## 2024-02-15 DIAGNOSIS — I4892 Unspecified atrial flutter: Secondary | ICD-10-CM | POA: Diagnosis not present

## 2024-02-15 DIAGNOSIS — I480 Paroxysmal atrial fibrillation: Secondary | ICD-10-CM

## 2024-02-15 NOTE — Progress Notes (Addendum)
 "   Primary Care Physician: Delores Suzann HERO, MD Primary Cardiologist: None Electrophysiologist: Fonda Kitty, MD     Referring Physician: Dr. Kitty Gaither Shawn Perkins is a 68 y.o. male with a history of HTN, tobacco use, ETOH use, chronic systolic HF secondary to NICM, left adrenal mass, and atrial fibrillation who presents for consultation in the [facility]. Patient is on Eliquis  5 mg BID for a CHADS2VASC score of 3. S/p Afib ablation on 06/13/23 by Dr. Kitty.   Follow-up 02/15/2024.  Patient is status post hospital admission 12/16-18 for CHF exacerbation.  He was found to be in atrial flutter with RVR and underwent DCCV in the ED.  No missed doses of Eliquis .  Patient feels better since hospital discharge.  He currently feels doctored out with multiple appointments this week.  Today, he denies symptoms of orthopnea, PND, lower extremity edema, dizziness, presyncope, syncope, snoring, daytime somnolence, bleeding, or neurologic sequela. The patient is tolerating medications without difficulties and is otherwise without complaint today.    he has a BMI of Body mass index is 36.04 kg/m.SABRA Filed Weights   02/15/24 0812  Weight: 117.2 kg    Current Outpatient Medications  Medication Sig Dispense Refill   acetaminophen  (TYLENOL ) 500 MG tablet Take 1,000 mg by mouth as needed (BACK PAIN.).     albuterol  (VENTOLIN  HFA) 108 (90 Base) MCG/ACT inhaler Inhale 2 puffs into the lungs every 6 (six) hours as needed for wheezing or shortness of breath. 6.7 g 2   apixaban  (ELIQUIS ) 5 MG TABS tablet Take 1 tablet (5 mg total) by mouth 2 (two) times daily. 180 tablet 3   atorvastatin  (LIPITOR) 40 MG tablet Take 1 tablet (40 mg total) by mouth daily. 90 tablet 3   Blood Pressure Monitoring (BLOOD PRESSURE CUFF) MISC Please check and monitor blood pressure daily 1 each 0   carvedilol  (COREG ) 25 MG tablet Take 1 tablet (25 mg total) by mouth 2 (two) times daily. 60 tablet 0    FARXIGA  10 MG TABS tablet TAKE 1 TABLET BY MOUTH DAILY BEFORE BREAKFAST. 90 tablet 1   furosemide  (LASIX ) 40 MG tablet Take 1 tablet (40 mg total) by mouth daily. 90 tablet 1   Insulin Pen Needle (BD PEN NEEDLE NANO 2ND GEN) 32G X 4 MM MISC Use to inject once weekly 90 each 1   sacubitril -valsartan  (ENTRESTO ) 24-26 MG Take 1 tablet by mouth 2 (two) times daily. 60 tablet 0   spironolactone  (ALDACTONE ) 25 MG tablet Take 0.5 tablets (12.5 mg total) by mouth daily. 90 tablet 3   umeclidinium-vilanterol (ANORO ELLIPTA ) 62.5-25 MCG/ACT AEPB Inhale 1 puff into the lungs daily. 60 each 0   vitamin B-12 (CYANOCOBALAMIN ) 1000 MCG tablet Take 1 tablet (1,000 mcg total) by mouth daily. 90 tablet 3   [Paused] KLOR-CON  M20 20 MEQ tablet TAKE 1 TABLET (20 MEQ TOTAL) BY MOUTH DAILY AS NEEDED ON THE DAYS THAT YOU TAKE LASIX  (FUROSEMIDE ) 90 tablet 1   No current facility-administered medications for this encounter.    Atrial Fibrillation Management history:  Previous antiarrhythmic drugs: amiodarone  Previous cardioversions: none Previous ablations: 06/13/23 Anticoagulation history: Eliquis    ROS- All systems are reviewed and negative except as per the HPI above.  Physical Exam: BP 100/80   Pulse 71   Ht 5' 11 (1.803 m)   Wt 117.2 kg   BMI 36.04 kg/m   GEN- The patient is well appearing, alert and oriented x 3 today.  Neck - no JVD or carotid bruit noted Lungs- Clear to ausculation bilaterally, normal work of breathing Heart- Regular rate and rhythm, no murmurs, rubs or gallops, PMI not laterally displaced Extremities- no clubbing, cyanosis, or edema Skin - no rash or ecchymosis noted   EKG today demonstrates  EKG Interpretation Date/Time:  Friday February 15 2024 08:24:22 EST Ventricular Rate:  71 PR Interval:  184 QRS Duration:  106 QT Interval:  392 QTC Calculation: 425 R Axis:   -28  Text Interpretation: Normal sinus rhythm Minimal voltage criteria for LVH, may be normal variant (  R in aVL ) Nonspecific T wave abnormality Abnormal ECG When compared with ECG of 06-Feb-2024 06:25, PREVIOUS ECG IS PRESENT Confirmed by Terra Pac ([id number]) on 02/15/2024 8:47:33 AM     Echo 02/06/24: 1. Left ventricular ejection fraction, by estimation, is 25 to 30%. The  left ventricle has severely decreased function. The left ventricle  demonstrates global hypokinesis. The left ventricular internal cavity size  was moderately dilated. Left  ventricular diastolic parameters are indeterminate.   2. Right ventricular systolic function is moderately reduced. The right  ventricular size is normal.   3. Left atrial size was severely dilated.   4. Right atrial size was moderately dilated.   5. The mitral valve is normal in structure. Mild to moderate mitral valve  regurgitation. No evidence of mitral stenosis.   6. Severe tricuspid stenosis.   7. The aortic valve is tricuspid. There is mild calcification of the  aortic valve. Aortic valve regurgitation is trivial. Aortic valve  sclerosis/calcification is present, without any evidence of aortic  stenosis.   8. Aortic dilatation noted. There is borderline dilatation of the aortic  root, measuring 38 mm. There is mild dilatation of the ascending aorta,  measuring 40 mm.   9. The inferior vena cava is normal in size with greater than 50%  respiratory variability, suggesting right atrial pressure of 3 mmHg.   ASSESSMENT & PLAN CHA2DS2-VASc Score = 4  The patient's score is based upon: CHF History: 1 HTN History: 1 Diabetes History: 1 Stroke History: 0 Vascular Disease History: 0 Age Score: 1 Gender Score: 0       ASSESSMENT AND PLAN: Persistent Atrial Fibrillation/flutter (ICD10:  I48.19) The patient's CHA2DS2-VASc score is 4, indicating a 4.8% annual risk of stroke.   S/p Afib ablation on 06/13/23 by Dr. Kennyth.  Patient is currently in NSR.  We discussed rhythm control options and consideration for sleep study for sleep apnea.   I briefly went over the likely possibility that if patient has sleep apnea it could be the etiology for his A-fib/flutter episodes.  If he has another episode of arrhythmia, would recommend Tikosyn admission for long-term rhythm control.  We could also consider repeat ablation if patient is amenable.  He was previously on amiodarone  as a bridge to ablation, so that may be considered as well if he is willing to discuss repeat ablation.  After discussion, patient is agreeable to a sleep study referral but will likely schedule it in a month or so after he is done with all of these upcoming appointments.  Continue Coreg  25 mg twice daily.   Secondary Hypercoagulable State (ICD10:  D68.69) The patient is at significant risk for stroke/thromboembolism based upon his CHA2DS2-VASc Score of 4.  Continue Apixaban  (Eliquis ).  No missed doses of Eliquis .       Follow up A-fib clinic 3 to 4 months.    Terra Pac, PA-C  Afib Clinic  [facility] [address] [place], [place] [address] [phone]  "

## 2024-02-19 ENCOUNTER — Inpatient Hospital Stay
Admission: RE | Admit: 2024-02-19 | Discharge: 2024-02-19 | Disposition: A | Payer: Medicare (Managed Care) | Source: Ambulatory Visit | Attending: Family Medicine

## 2024-02-19 DIAGNOSIS — Z87891 Personal history of nicotine dependence: Secondary | ICD-10-CM

## 2024-02-20 ENCOUNTER — Ambulatory Visit: Payer: Self-pay | Admitting: Family Medicine

## 2024-02-25 ENCOUNTER — Encounter: Payer: Self-pay | Admitting: Cardiology

## 2024-02-25 ENCOUNTER — Ambulatory Visit: Payer: Medicare (Managed Care) | Attending: Cardiology | Admitting: Cardiology

## 2024-02-25 VITALS — BP 100/68 | HR 80 | Ht 71.0 in | Wt 253.8 lb

## 2024-02-25 DIAGNOSIS — I38 Endocarditis, valve unspecified: Secondary | ICD-10-CM | POA: Diagnosis not present

## 2024-02-25 DIAGNOSIS — I10 Essential (primary) hypertension: Secondary | ICD-10-CM | POA: Diagnosis not present

## 2024-02-25 DIAGNOSIS — I77819 Aortic ectasia, unspecified site: Secondary | ICD-10-CM

## 2024-02-25 DIAGNOSIS — I4892 Unspecified atrial flutter: Secondary | ICD-10-CM | POA: Diagnosis not present

## 2024-02-25 DIAGNOSIS — I509 Heart failure, unspecified: Secondary | ICD-10-CM | POA: Diagnosis not present

## 2024-02-25 DIAGNOSIS — G4733 Obstructive sleep apnea (adult) (pediatric): Secondary | ICD-10-CM

## 2024-02-25 DIAGNOSIS — I5022 Chronic systolic (congestive) heart failure: Secondary | ICD-10-CM

## 2024-02-25 NOTE — Progress Notes (Addendum)
 " Cardiology Office Note:  .   Date:  02/25/2024  ID:  DEARIS Perkins, DOB 1955/04/28, MRN [id number] PCP: Delores Suzann HERO, MD  [facility] Providers Cardiologist:  Gordy Bergamo, MD Electrophysiologist:  Fonda Kitty, MD {  History of Present Illness: .   Shawn Perkins is a 69 y.o. male with history of NICM with reduced EF, persistent atrial fibrillation status post ablation 05/2023, hypertension, prior tobacco/alcohol, left adrenal mass, ascending aortic aneurysm, valvular heart disease     NICM 2012 normal coronaries.  EF 35%. EF recovered 2014. 08/2022 EF 45% in setting of atrial fibrillation. 01/2023 EF recovered 01/2024 admission atrial flutter RVR, EF 25 to 30%.  Atrial fibrillation 08/2022 newly diagnosed at PCPs office in setting of SOB.  EF 45%. 10/2022 DCCV x 3 unsuccessful. 11/2022 DCCV on amiodarone  with success 05/2023 A-fib ablation 01/2024 CHF admission and atrial flutter RVR, DCCV in the ED.  EF 25 to 30%.  Social history Prior cocaine abuse History of rheumatic fever as a child. Superintendent and works in [employer] Quit smoking a year ago and quit drinking 2 years ago.  No drugs. Moderately active 3 to 4 days/week. Daughter helps take care of him.      Patient has history of NICM with EF that has fluctuated throughout the years in the setting of A-fib RVR.  They have had had prior ablation 05/2023.  They were recently admitted 01/2024 for atypical atrial flutter with RVR and had DCCV successfully in the ED.  EF had dropped 25 to 30%.  Seen at follow-up with A-fib clinic 12/26 and without cardiac symptoms.  In sinus rhythm at that time.  Today patient presents for hospital follow-up.  He reports he is doing well without any complaints today.  He has not noticed any episodes of atrial fibrillation.  He reports at times he is aware of it and feels some palpitations.  Reports he has not completed his sleep study previously because they make him do it out [facility]  and that is too far for him, he has no mode of transportation and does not want to burden his daughter with this.  Otherwise he says he is very active, works in maintenance and always moving around has no exertional symptoms.  Weight has been stable, no shortness of breath or peripheral edema.  ROS: Denies: Chest pain, shortness of breath, orthopnea, peripheral edema, palpitations, syncope, decreased exercise capacity, fatigue, dizziness.   Studies Reviewed: .         Risk Assessment/Calculations:    CHA2DS2-VASc Score = 4   This indicates a 4.8% annual risk of stroke. The patient's score is based upon: CHF History: 1 HTN History: 1 Diabetes History: 1 Stroke History: 0 Vascular Disease History: 0 Age Score: 1 Gender Score: 0          Physical Exam:   VS:  BP 100/68   Pulse 80   Ht 5' 11 (1.803 m)   Wt 253 lb 12.8 oz (115.1 kg)   SpO2 97%   BMI 35.40 kg/m    Wt Readings from Last 3 Encounters:  02/25/24 253 lb 12.8 oz (115.1 kg)  02/15/24 258 lb 6.4 oz (117.2 kg)  02/12/24 255 lb 9.6 oz (115.9 kg)    GEN: Well nourished, well developed in no acute distress NECK: No JVD; No carotid bruits CARDIAC: RRR, no murmurs, rubs, gallops RESPIRATORY:  Clear to auscultation without rales, wheezing or rhonchi  ABDOMEN: Soft, non-tender, non-distended EXTREMITIES:  No edema;  No deformity   ASSESSMENT AND PLAN: .    NICM with reduced EF -01/2024 EF 25 to 30% in the setting of atrial flutter RVR, moderately reduced RV.   EF has fluctuated throughout the years in setting of A-fib RVR.  Fortunately demonstrating NYHA class I symptoms, stable weights and euvolemic on today's exam.  Does not require diuretics. GDMT: Continue carvedilol  25 mg twice daily, Farxiga  10 mg, Entresto  24-26 mg twice daily, spironolactone  12.5 mg daily.  BP does not allow further titration of GDMT at this time.  Repeat BMP today. Repeat echocardiogram in the next 2 to 3 months. If EF still depressed or without  significant improvement may need to consider repeat ischemic evaluation.  Persistent atrial fibrillation/atypical flutter -05/2023 A-fib ablation -1216/2025 DCCV in the ED.   Based on physical exam seems to be in sinus rhythm. Continue Eliquis  5 mg twice daily, has not missed any doses of this Possible plans of Tikosyn admission should he have another episode.  Could also consider repeat ablation.  Followed by EP and atrial fibrillation clinic.  Defer further management to them. Referred to sleep study, we will follow-up on this.  He just does not want to go to [facility].  We will try to get his arranged for [facility] long. Stop bang at least 3+  Hypertension Blood pressure well-controlled in the context above.  Tobacco use Quit smoking approximately 1 year ago.  Valvular heart disease History of rheumatic fever as a child.  Previous moderate to severe MR in the setting of atrial fibrillation.  Most recent echocardiogram mild to moderate MR.  Severe tricuspid stenosis.  Aortic valve sclerosis. No MS.  Likely secondary to rheumatic disease, asymptomatic at this time.  Needs close monitoring.  Will review with MD, may consider structural evaluation.  Aortic dilatation 40 mm of the ascending aorta.  Needs annual monitoring.  Hyperlipidemia Continue atorvastatin  40 mg daily.  Dispo: Does not have established cardiologist, follow-up with Dr. Ladona after echocardiogram.  Addendum: In reference to severe tricuspid stenosis noted on echocardiogram 02/06/2024, these findings were reviewed with primary cardiologist Dr. Ladona as well as echocardiogram reader Dr. Cherrie who both agree that this was a typo and report will be addended.  Patient does NOT have severe tricuspid stenosis.  I have called patient directly and explained results.  He voices no concerns and did not have any further questions.  Signed, Thom LITTIE Sluder, PA-C  "

## 2024-02-25 NOTE — Patient Instructions (Signed)
 Medication Instructions:  Your physician recommends that you continue on your current medications as directed. Please refer to the Current Medication list given to you today.  *If you need a refill on your cardiac medications before your next appointment, please call your pharmacy*  Lab Work: Today: BMP If you have labs (blood work) drawn today and your tests are completely normal, you will receive your results only by: MyChart Message (if you have MyChart) OR A paper copy in the mail If you have any lab test that is abnormal or we need to change your treatment, we will call you to review the results.  Testing/Procedures: Echocardiogram in 2 - 3 months.  Your physician has requested that you have an echocardiogram. Echocardiography is a painless test that uses sound waves to create images of your heart. It provides your doctor with information about the size and shape of your heart and how well your hearts chambers and valves are working. This procedure takes approximately one hour. There are no restrictions for this procedure. Please do NOT wear cologne, perfume, aftershave, or lotions (deodorant is allowed). Please arrive 15 minutes prior to your appointment time.  Please note: We ask at that you not bring children with you during ultrasound (echo/ vascular) testing. Due to room size and safety concerns, children are not allowed in the ultrasound rooms during exams. Our front office staff cannot provide observation of children in our lobby area while testing is being conducted. An adult accompanying a patient to their appointment will only be allowed in the ultrasound room at the discretion of the ultrasound technician under special circumstances. We apologize for any inconvenience.   Follow-Up: At [facility], you and your health needs are our priority.  As part of our continuing mission to provide you with exceptional heart care, our providers are all part of one team.  This team  includes your primary Cardiologist (physician) and Advanced Practice Providers or APPs (Physician Assistants and Nurse Practitioners) who all work together to provide you with the care you need, when you need it.  Your next appointment:    After Echocardiogram.  Provider:   Gordy Bergamo, MD    We recommend signing up for the patient portal called MyChart.  Sign up information is provided on this After Visit Summary.  MyChart is used to connect with patients for Virtual Visits (Telemedicine).  Patients are able to view lab/test results, encounter notes, upcoming appointments, etc.  Non-urgent messages can be sent to your provider as well.   To learn more about what you can do with MyChart, go to [email or web address].   Other Instructions None

## 2024-02-26 ENCOUNTER — Ambulatory Visit: Payer: Self-pay | Admitting: Cardiology

## 2024-02-26 LAB — BASIC METABOLIC PANEL WITH GFR
BUN/Creatinine Ratio: 14 (ref 10–24)
BUN: 18 mg/dL (ref 8–27)
CO2: 20 mmol/L (ref 20–29)
Calcium: 9.9 mg/dL (ref 8.6–10.2)
Chloride: 108 mmol/L — ABNORMAL HIGH (ref 96–106)
Creatinine, Ser: 1.28 mg/dL — ABNORMAL HIGH (ref 0.76–1.27)
Glucose: 81 mg/dL (ref 70–99)
Potassium: 4.8 mmol/L (ref 3.5–5.2)
Sodium: 143 mmol/L (ref 134–144)
eGFR: 61 mL/min/1.73

## 2024-02-26 NOTE — Progress Notes (Signed)
Stable kidney function.

## 2024-03-05 ENCOUNTER — Other Ambulatory Visit: Payer: Self-pay | Admitting: Thoracic Surgery (Cardiothoracic Vascular Surgery)

## 2024-03-05 DIAGNOSIS — I7121 Aneurysm of the ascending aorta, without rupture: Secondary | ICD-10-CM

## 2024-03-06 LAB — ECHOCARDIOGRAM COMPLETE
AR max vel: 3.03 cm2
AV Peak grad: 13.2 mmHg
Ao pk vel: 1.82 m/s
Area-P 1/2: 3.65 cm2
Height: 71 in
MV M vel: 4.33 m/s
MV Peak grad: 75 mmHg
MV VTI: 3.4 cm2
S' Lateral: 5.6 cm
Single Plane A4C EF: 38.5 %
Weight: 4232.83 [oz_av]

## 2024-03-07 ENCOUNTER — Other Ambulatory Visit: Payer: Self-pay

## 2024-03-07 MED ORDER — SACUBITRIL-VALSARTAN 24-26 MG PO TABS: 1.0000 | ORAL_TABLET | Freq: Two times a day (BID) | ORAL | 0 refills | Status: AC

## 2024-03-07 MED ORDER — CARVEDILOL 25 MG PO TABS: 25.0000 mg | ORAL_TABLET | Freq: Two times a day (BID) | ORAL | 0 refills | Status: AC

## 2024-03-07 MED ORDER — ALBUTEROL SULFATE HFA 108 (90 BASE) MCG/ACT IN AERS: 2.0000 | INHALATION_SPRAY | Freq: Four times a day (QID) | RESPIRATORY_TRACT | 2 refills | Status: AC | PRN

## 2024-03-07 MED ORDER — UMECLIDINIUM-VILANTEROL 62.5-25 MCG/ACT IN AEPB: 1.0000 | INHALATION_SPRAY | Freq: Every day | RESPIRATORY_TRACT | 0 refills | Status: AC

## 2024-03-12 ENCOUNTER — Other Ambulatory Visit: Payer: Self-pay

## 2024-03-12 DIAGNOSIS — I4891 Unspecified atrial fibrillation: Secondary | ICD-10-CM

## 2024-03-12 MED ORDER — APIXABAN 5 MG PO TABS: 5.0000 mg | ORAL_TABLET | Freq: Two times a day (BID) | ORAL | 3 refills | Status: AC

## 2024-03-18 NOTE — Telephone Encounter (Signed)
 ERROR

## 2024-04-01 ENCOUNTER — Other Ambulatory Visit: Payer: Medicare (Managed Care)

## 2024-04-08 ENCOUNTER — Ambulatory Visit: Payer: Medicare (Managed Care)

## 2024-04-28 ENCOUNTER — Ambulatory Visit (HOSPITAL_COMMUNITY): Payer: Medicare (Managed Care)

## 2024-04-29 ENCOUNTER — Ambulatory Visit: Payer: Medicare (Managed Care) | Admitting: Cardiology

## 2024-06-12 ENCOUNTER — Ambulatory Visit (HOSPITAL_COMMUNITY): Payer: Medicare (Managed Care) | Admitting: Internal Medicine

## 2024-08-11 ENCOUNTER — Ambulatory Visit: Payer: Medicare (Managed Care) | Admitting: Physician Assistant

## 2025-01-14 ENCOUNTER — Ambulatory Visit: Payer: Medicare (Managed Care) | Admitting: Endocrinology
